# Patient Record
Sex: Male | Born: 1945 | Race: Black or African American | Hispanic: No | Marital: Single | Smoking: Never smoker
Health system: Southern US, Community
[De-identification: ages and names within clinical notes are randomized; demographics above are authoritative.]

## PROBLEM LIST (undated history)

## (undated) DIAGNOSIS — S065X9A Traumatic subdural hemorrhage with loss of consciousness of unspecified duration, initial encounter: Secondary | ICD-10-CM

## (undated) DIAGNOSIS — I10 Essential (primary) hypertension: Secondary | ICD-10-CM

## (undated) DIAGNOSIS — C73 Malignant neoplasm of thyroid gland: Secondary | ICD-10-CM

## (undated) DIAGNOSIS — M549 Dorsalgia, unspecified: Secondary | ICD-10-CM

## (undated) DIAGNOSIS — R972 Elevated prostate specific antigen [PSA]: Secondary | ICD-10-CM

## (undated) DIAGNOSIS — D649 Anemia, unspecified: Secondary | ICD-10-CM

## (undated) DIAGNOSIS — C61 Malignant neoplasm of prostate: Secondary | ICD-10-CM

## (undated) DIAGNOSIS — G40909 Epilepsy, unspecified, not intractable, without status epilepticus: Secondary | ICD-10-CM

## (undated) DIAGNOSIS — W19XXXA Unspecified fall, initial encounter: Secondary | ICD-10-CM

## (undated) HISTORY — DX: Unspecified fall, initial encounter: W19.XXXA

## (undated) HISTORY — DX: Anemia, unspecified: D64.9

## (undated) HISTORY — DX: Epilepsy, unspecified, not intractable, without status epilepticus: G40.909

## (undated) HISTORY — PX: WISDOM TOOTH EXTRACTION: SHX21

## (undated) HISTORY — DX: Malignant neoplasm of prostate: C61

## (undated) HISTORY — DX: Essential (primary) hypertension: I10

## (undated) HISTORY — DX: Dorsalgia, unspecified: M54.9

## (undated) HISTORY — PX: CATARACT EXTRACTION: SUR2

## (undated) HISTORY — PX: HERNIA REPAIR: SHX51

## (undated) HISTORY — DX: Elevated prostate specific antigen (PSA): R97.20

## (undated) HISTORY — DX: Traumatic subdural hemorrhage with loss of consciousness of unspecified duration, initial encounter: S06.5X9A

---
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
## 1997-07-15 ENCOUNTER — Ambulatory Visit (HOSPITAL_COMMUNITY): Admission: RE | Admit: 1997-07-15 | Discharge: 1997-07-15 | Payer: Self-pay | Admitting: Cardiology

## 1997-08-28 ENCOUNTER — Ambulatory Visit (HOSPITAL_COMMUNITY): Admission: RE | Admit: 1997-08-28 | Discharge: 1997-08-28 | Payer: Self-pay

## 2001-08-23 ENCOUNTER — Ambulatory Visit (HOSPITAL_BASED_OUTPATIENT_CLINIC_OR_DEPARTMENT_OTHER): Admission: RE | Admit: 2001-08-23 | Discharge: 2001-08-23 | Payer: Self-pay | Admitting: *Deleted

## 2001-08-23 ENCOUNTER — Encounter: Payer: Self-pay | Admitting: Internal Medicine

## 2005-09-23 ENCOUNTER — Ambulatory Visit: Payer: Self-pay | Admitting: Family Medicine

## 2005-10-24 ENCOUNTER — Ambulatory Visit: Payer: Self-pay | Admitting: Family Medicine

## 2006-02-10 ENCOUNTER — Ambulatory Visit: Payer: Self-pay | Admitting: Internal Medicine

## 2006-03-30 ENCOUNTER — Ambulatory Visit: Payer: Self-pay | Admitting: Family Medicine

## 2006-03-30 LAB — CONVERTED CEMR LAB
BUN: 14 mg/dL (ref 6–23)
GFR calc Af Amer: 98 mL/min
Potassium: 4.5 meq/L (ref 3.5–5.1)
Sodium: 137 meq/L (ref 135–145)

## 2006-05-02 ENCOUNTER — Ambulatory Visit: Payer: Self-pay | Admitting: Family Medicine

## 2006-05-02 LAB — CONVERTED CEMR LAB
BUN: 13 mg/dL (ref 6–23)
Calcium: 9.5 mg/dL (ref 8.4–10.5)
GFR calc Af Amer: 98 mL/min
GFR calc non Af Amer: 81 mL/min
Potassium: 4.3 meq/L (ref 3.5–5.1)

## 2006-05-31 ENCOUNTER — Ambulatory Visit: Payer: Self-pay | Admitting: Family Medicine

## 2006-05-31 LAB — CONVERTED CEMR LAB
Calcium: 9.1 mg/dL (ref 8.4–10.5)
Chloride: 105 meq/L (ref 96–112)
GFR calc Af Amer: 111 mL/min
GFR calc non Af Amer: 92 mL/min
Sodium: 140 meq/L (ref 135–145)

## 2006-08-01 DIAGNOSIS — G40909 Epilepsy, unspecified, not intractable, without status epilepticus: Secondary | ICD-10-CM

## 2006-08-08 ENCOUNTER — Encounter (INDEPENDENT_AMBULATORY_CARE_PROVIDER_SITE_OTHER): Payer: Self-pay | Admitting: Family Medicine

## 2006-08-08 ENCOUNTER — Ambulatory Visit: Payer: Self-pay | Admitting: Family Medicine

## 2006-08-08 ENCOUNTER — Encounter: Payer: Self-pay | Admitting: Family Medicine

## 2006-08-08 DIAGNOSIS — I10 Essential (primary) hypertension: Secondary | ICD-10-CM

## 2006-08-08 LAB — CONVERTED CEMR LAB
BUN: 14 mg/dL (ref 6–23)
Chloride: 108 meq/L (ref 96–112)
Creatinine, Ser: 1 mg/dL (ref 0.4–1.5)
GFR calc non Af Amer: 81 mL/min
Potassium: 4.4 meq/L (ref 3.5–5.1)
Sodium: 143 meq/L (ref 135–145)

## 2006-08-10 ENCOUNTER — Telehealth (INDEPENDENT_AMBULATORY_CARE_PROVIDER_SITE_OTHER): Payer: Self-pay | Admitting: *Deleted

## 2006-10-16 ENCOUNTER — Telehealth (INDEPENDENT_AMBULATORY_CARE_PROVIDER_SITE_OTHER): Payer: Self-pay | Admitting: *Deleted

## 2006-10-18 ENCOUNTER — Encounter: Admission: RE | Admit: 2006-10-18 | Discharge: 2006-10-18 | Payer: Self-pay | Admitting: Family Medicine

## 2006-11-02 ENCOUNTER — Encounter: Admission: RE | Admit: 2006-11-02 | Discharge: 2006-11-02 | Payer: Self-pay | Admitting: Internal Medicine

## 2006-11-09 ENCOUNTER — Ambulatory Visit: Payer: Self-pay | Admitting: Family Medicine

## 2007-02-12 ENCOUNTER — Ambulatory Visit: Payer: Self-pay | Admitting: Family Medicine

## 2007-02-13 LAB — CONVERTED CEMR LAB
CO2: 30 meq/L (ref 19–32)
Calcium: 9.3 mg/dL (ref 8.4–10.5)
Chloride: 108 meq/L (ref 96–112)
Cholesterol: 197 mg/dL (ref 0–200)
GFR calc non Af Amer: 92 mL/min
Glucose, Bld: 103 mg/dL — ABNORMAL HIGH (ref 70–99)
HDL: 58.4 mg/dL (ref >39.0)
LDL Cholesterol: 129 mg/dL — ABNORMAL HIGH (ref 0–99)
Sodium: 144 meq/L (ref 135–145)

## 2007-05-16 ENCOUNTER — Ambulatory Visit: Payer: Self-pay | Admitting: Family Medicine

## 2007-05-18 ENCOUNTER — Encounter (INDEPENDENT_AMBULATORY_CARE_PROVIDER_SITE_OTHER): Payer: Self-pay | Admitting: *Deleted

## 2007-05-18 ENCOUNTER — Telehealth (INDEPENDENT_AMBULATORY_CARE_PROVIDER_SITE_OTHER): Payer: Self-pay | Admitting: *Deleted

## 2007-05-18 ENCOUNTER — Encounter (INDEPENDENT_AMBULATORY_CARE_PROVIDER_SITE_OTHER): Payer: Self-pay | Admitting: Family Medicine

## 2007-05-18 LAB — CONVERTED CEMR LAB
BUN: 10 mg/dL (ref 6–23)
Calcium: 9.4 mg/dL (ref 8.4–10.5)
GFR calc Af Amer: 98 mL/min
GFR calc non Af Amer: 81 mL/min
LDL Cholesterol: 106 mg/dL — ABNORMAL HIGH (ref 0–99)
PSA: 1.19 ng/mL (ref 0.10–4.00)
Phenytoin Lvl: 11.6 ug/mL (ref 10.0–20.0)
Potassium: 3.9 meq/L (ref 3.5–5.1)
Total CHOL/HDL Ratio: 2.9
Triglycerides: 61 mg/dL (ref 0–149)
VLDL: 12 mg/dL (ref 0–40)

## 2007-09-19 ENCOUNTER — Ambulatory Visit: Payer: Self-pay | Admitting: Internal Medicine

## 2007-09-19 DIAGNOSIS — J309 Allergic rhinitis, unspecified: Secondary | ICD-10-CM

## 2008-01-09 ENCOUNTER — Ambulatory Visit: Payer: Self-pay | Admitting: Internal Medicine

## 2008-04-09 ENCOUNTER — Telehealth (INDEPENDENT_AMBULATORY_CARE_PROVIDER_SITE_OTHER): Payer: Self-pay | Admitting: *Deleted

## 2008-04-16 ENCOUNTER — Telehealth: Payer: Self-pay | Admitting: Internal Medicine

## 2008-04-24 ENCOUNTER — Telehealth (INDEPENDENT_AMBULATORY_CARE_PROVIDER_SITE_OTHER): Payer: Self-pay | Admitting: *Deleted

## 2008-05-19 ENCOUNTER — Ambulatory Visit: Payer: Self-pay | Admitting: Internal Medicine

## 2008-05-20 ENCOUNTER — Encounter: Payer: Self-pay | Admitting: Internal Medicine

## 2008-05-21 ENCOUNTER — Telehealth (INDEPENDENT_AMBULATORY_CARE_PROVIDER_SITE_OTHER): Payer: Self-pay | Admitting: *Deleted

## 2008-05-21 LAB — CONVERTED CEMR LAB
ALT: 28 units/L (ref 0–53)
AST: 31 units/L (ref 0–37)
Albumin: 3.8 g/dL (ref 3.5–5.2)
BUN: 11 mg/dL (ref 6–23)
Basophils Relative: 0.1 % (ref 0.0–3.0)
CO2: 29 meq/L (ref 19–32)
Calcium: 9 mg/dL (ref 8.4–10.5)
Chloride: 103 meq/L (ref 96–112)
Cholesterol: 188 mg/dL (ref 0–200)
Creatinine, Ser: 0.9 mg/dL (ref 0.4–1.5)
Eosinophils Absolute: 0.1 10*3/uL (ref 0.0–0.7)
Eosinophils Relative: 1.5 % (ref 0.0–5.0)
GFR calc non Af Amer: 91 mL/min
Hemoglobin: 14.6 g/dL (ref 13.0–17.0)
MCHC: 34 g/dL (ref 30.0–36.0)
MCV: 99.1 fL (ref 78.0–100.0)
Neutro Abs: 3.6 10*3/uL (ref 1.4–7.7)
Neutrophils Relative %: 68.4 % (ref 43.0–77.0)
PSA: 1.25 ng/mL (ref 0.10–4.00)
Phenytoin Lvl: 9.2 ug/mL — ABNORMAL LOW (ref 10.0–20.0)
RBC: 4.32 M/uL (ref 4.22–5.81)
VLDL: 9 mg/dL (ref 0–40)
WBC: 5.3 10*3/uL (ref 4.5–10.5)

## 2008-06-17 ENCOUNTER — Telehealth (INDEPENDENT_AMBULATORY_CARE_PROVIDER_SITE_OTHER): Payer: Self-pay | Admitting: *Deleted

## 2008-06-20 ENCOUNTER — Telehealth (INDEPENDENT_AMBULATORY_CARE_PROVIDER_SITE_OTHER): Payer: Self-pay | Admitting: *Deleted

## 2008-07-04 ENCOUNTER — Encounter: Payer: Self-pay | Admitting: Internal Medicine

## 2008-12-24 ENCOUNTER — Ambulatory Visit: Payer: Self-pay | Admitting: Internal Medicine

## 2008-12-29 ENCOUNTER — Ambulatory Visit: Payer: Self-pay | Admitting: Internal Medicine

## 2008-12-29 DIAGNOSIS — M25559 Pain in unspecified hip: Secondary | ICD-10-CM

## 2008-12-31 ENCOUNTER — Telehealth (INDEPENDENT_AMBULATORY_CARE_PROVIDER_SITE_OTHER): Payer: Self-pay | Admitting: *Deleted

## 2009-06-23 ENCOUNTER — Ambulatory Visit: Payer: Self-pay | Admitting: Internal Medicine

## 2009-06-25 LAB — CONVERTED CEMR LAB
BUN: 11 mg/dL (ref 6–23)
Basophils Absolute: 0 10*3/uL (ref 0.0–0.1)
CO2: 30 meq/L (ref 19–32)
Chloride: 104 meq/L (ref 96–112)
Cholesterol: 198 mg/dL (ref 0–200)
Creatinine, Ser: 0.8 mg/dL (ref 0.4–1.5)
Eosinophils Absolute: 0.1 10*3/uL (ref 0.0–0.7)
MCHC: 33.7 g/dL (ref 30.0–36.0)
MCV: 98.2 fL (ref 78.0–100.0)
Monocytes Absolute: 0.4 10*3/uL (ref 0.1–1.0)
Neutrophils Relative %: 25 % — ABNORMAL LOW (ref 43.0–77.0)
Platelets: 151 10*3/uL (ref 150.0–400.0)
RDW: 13.2 % (ref 11.5–14.6)
Total CHOL/HDL Ratio: 3
Triglycerides: 69 mg/dL (ref 0.0–149.0)

## 2009-06-30 ENCOUNTER — Encounter: Payer: Self-pay | Admitting: Internal Medicine

## 2009-07-09 ENCOUNTER — Ambulatory Visit: Payer: Self-pay | Admitting: Internal Medicine

## 2009-07-14 LAB — CONVERTED CEMR LAB
Basophils Relative: 0.4 % (ref 0.0–3.0)
Eosinophils Absolute: 0.1 10*3/uL (ref 0.0–0.7)
HCT: 41.8 % (ref 39.0–52.0)
Hemoglobin: 13.9 g/dL (ref 13.0–17.0)
Lymphocytes Relative: 25.2 % (ref 12.0–46.0)
MCHC: 33.2 g/dL (ref 30.0–36.0)
MCV: 98.4 fL (ref 78.0–100.0)
Neutro Abs: 3.6 10*3/uL (ref 1.4–7.7)
RBC: 4.25 M/uL (ref 4.22–5.81)

## 2009-12-17 ENCOUNTER — Ambulatory Visit: Payer: Self-pay | Admitting: Internal Medicine

## 2009-12-30 ENCOUNTER — Telehealth (INDEPENDENT_AMBULATORY_CARE_PROVIDER_SITE_OTHER): Payer: Self-pay | Admitting: *Deleted

## 2010-04-13 NOTE — Consult Note (Signed)
Summary: [facility]  [facility]   Imported By: Lanelle Bal 06/30/2009 09:10:39  _____________________________________________________________________  External Attachment:    Type:   Image     Comment:   External Document

## 2010-04-13 NOTE — Assessment & Plan Note (Signed)
Summary: cpx/kdc   Vital Signs:  Patient profile:   65 year old male Height:      72.75 inches Weight:      203 pounds BMI:     27.06 Pulse rate:   56 / minute BP sitting:   130 / 74  (left arm)  Vitals Entered By: Doristine Devoid (June 23, 2009 12:32 PM) CC: CPX AND LAB   History of Present Illness: CPX  Allergies: No Known Drug Allergies  Past History:  Past Medical History: Hypertension Seizure disorder, Dx remotely, last SZ activity aprox 1998 Allergic rhinitis h/o HNP aprox 2008, no surgery  Past Surgical History: Reviewed history from 09/19/2007 and no changes required. hernia repair as an  infant  Family History: Emphysema - M +smoker Brain tumor - F CAD - no DM - no HTN - F, bro stroke - no colon Ca - no prostate Ca - no leukemia - sis, died   Social History: Retired: [employer] widower, single  2 children, one in [place] another iin [place]-- does watch  Regular exercise- goes to the gym daily  Tobacco--no ETOH-- hardly ever  Review of Systems General:  Denies fever and weight loss. CV:  Denies chest pain or discomfort and swelling of feet. Resp:  Denies cough and shortness of breath. GI:  Denies bloody stools, diarrhea, nausea, and vomiting. GU:  Denies hematuria, urinary frequency, and urinary hesitancy.  Physical Exam  General:  alert, well-developed, and well-nourished.   Neck:  no masses, no thyromegaly, and normal carotid upstroke.   Lungs:  normal respiratory effort, no intercostal retractions, no accessory muscle use, and normal breath sounds.   Heart:  normal rate, regular rhythm, no murmur, and no gallop.   Abdomen:  soft, non-tender, no distention, no masses, no guarding, and no rigidity.   Rectal:  external hemorrhoids noted. Normal sphincter tone. No rectal masses or tenderness. Brown stool, Hemoccult negative Prostate:  Prostate gland firm and smooth, no enlargement, nodularity, tenderness, mass, asymmetry or  induration. Extremities:  no lower extremity edema Psych:  Cognition and judgment appear intact. Alert and cooperative with normal attention span and concentration.  not anxious appearing and not depressed appearing.     Impression & Recommendations:  Problem # 1:  PREVENTIVE HEALTH CARE (ICD-V70.0)  Td 09 colonoscopy 08/2001 Dr. Luther Parody,  normal.  Hemoccult negative today.  Next colonoscopy 2013 continue healthy lifestyle labs  Orders: Venipuncture (42706) TLB-ALT (SGPT) (84460-ALT) TLB-AST (SGOT) (84450-SGOT) TLB-BMP (Basic Metabolic Panel-BMET) (80048-METABOL) TLB-CBC Platelet - w/Differential (85025-CBCD) TLB-Lipid Panel (80061-LIPID) TLB-PSA (Prostate Specific Antigen) (84153-PSA)  Problem # 2:  SEIZURE DISORDER (ICD-780.39) just sar Dr Sandria Manly this week, dilantin level checked  His updated medication list for this problem includes:    Dilantin 100 Mg Caps (Phenytoin sodium extended) .Marland Kitchen... Take 2 tablets twice a day  Problem # 3:  HYPERTENSION (ICD-401.9) well controlled, see instructions ambulatory BPs great His updated medication list for this problem includes:    Azor 10-40 Mg Tabs (Amlodipine-olmesartan) .Marland Kitchen... Take one a day    Furosemide 40 Mg Tabs (Furosemide) .Marland Kitchen... 1/2 by mouth bid  BP today: 130/74 Prior BP: 140/72 (12/29/2008)  Labs Reviewed: K+: 3.8 (05/19/2008) Creat: : 0.9 (05/19/2008)   Chol: 188 (05/19/2008)   HDL: 69.6 (05/19/2008)   LDL: 109 (05/19/2008)   TG: 46 (05/19/2008)  Complete Medication List: 1)  Adult Aspirin Ec Low Strength 81 Mg Tbec (Aspirin) .... Take one by mouth daily 2)  Azor 10-40 Mg Tabs (  Amlodipine-olmesartan) .... Take one a day 3)  Furosemide 40 Mg Tabs (Furosemide) .... 1/2 by mouth bid 4)  Dilantin 100 Mg Caps (Phenytoin sodium extended) .... Take 2 tablets twice a day 5)  Multivitamin  .... Take one daily 6)  Resveratrol 160mg   .... 2 by mouth once daily 7)  Fish Oil 1200mg   .... 3 by mouth once daily 8)  Calcium - Vit D  600mg   .... 2 by mouth once daily 9)  Flonase 50 Mcg/act Susp (Fluticasone propionate) .... 2 sprays each nostril daily  Patient Instructions: 1)  Check your blood pressure 2 or 3 times a month. If it is more than 140/85 consistently,please let us know 2)  Please schedule a follow-up appointment in 1 year.

## 2010-04-13 NOTE — Progress Notes (Signed)
Summary: appt/ [facility] 10/25  Phone Note Outgoing Call   Call placed by: Army Fossa CMA,  December 30, 2009 2:45 PM Summary of Call: Pt due for OV with Dr.Paz.   Follow-up for Phone Call        [facility] FOR OFFICE VISIT WITH DR PAZ.Marland KitchenMarland KitchenJerolyn Shin  January 05, 2010 10:55 AM   patient returned call - he was confused why he needed an appt - he said at his cpx dr Drue Novel told him return 1 year - appt scheduled [id number] .Marland KitchenOkey Regal [place]  January 05, 2010 12:06 PM

## 2010-04-13 NOTE — Assessment & Plan Note (Signed)
Summary: FLU SHOT///SPH  Nurse Visit Flu Vaccine Consent Questions     Do you have a history of severe allergic reactions to this vaccine? no    Any prior history of allergic reactions to egg and/or gelatin? no    Do you have a sensitivity to the preservative Thimersol? no    Do you have a past history of Guillan-Barre Syndrome? no    Do you currently have an acute febrile illness? no    Have you ever had a severe reaction to latex? no    Vaccine information given and explained to patient? yes    Are you currently pregnant? no    Lot Number:[id number]   Exp Date:09/11/2010   Site Given  Left Deltoid IM   Allergies: No Known Drug Allergies  Orders Added: 1)  Admin 1st Vaccine [90471] 2)  Flu Vaccine 67yrs + [32355]

## 2010-05-19 ENCOUNTER — Encounter: Payer: Self-pay | Admitting: Internal Medicine

## 2010-06-30 ENCOUNTER — Ambulatory Visit (INDEPENDENT_AMBULATORY_CARE_PROVIDER_SITE_OTHER): Payer: BC Managed Care – PPO | Admitting: Internal Medicine

## 2010-06-30 ENCOUNTER — Encounter: Payer: Self-pay | Admitting: Internal Medicine

## 2010-06-30 DIAGNOSIS — R569 Unspecified convulsions: Secondary | ICD-10-CM

## 2010-06-30 DIAGNOSIS — I10 Essential (primary) hypertension: Secondary | ICD-10-CM

## 2010-06-30 DIAGNOSIS — Z0000 Encounter for general adult medical examination without abnormal findings: Secondary | ICD-10-CM

## 2010-06-30 LAB — LIPID PANEL
Cholesterol: 208 mg/dL — ABNORMAL HIGH (ref 0–200)
HDL: 72.4 mg/dL (ref >39.00)
Total CHOL/HDL Ratio: 3
Triglycerides: 66 mg/dL (ref 0.0–149.0)
VLDL: 13.2 mg/dL (ref 0.0–40.0)

## 2010-06-30 LAB — CBC WITH DIFFERENTIAL/PLATELET
Basophils Absolute: 0 K/uL (ref 0.0–0.1)
Basophils Relative: 0.6 % (ref 0.0–3.0)
Eosinophils Absolute: 0.1 K/uL (ref 0.0–0.7)
Eosinophils Relative: 2.3 % (ref 0.0–5.0)
HCT: 41.7 % (ref 39.0–52.0)
Hemoglobin: 14.3 g/dL (ref 13.0–17.0)
Lymphocytes Relative: 21.2 % (ref 12.0–46.0)
Lymphs Abs: 1.2 K/uL (ref 0.7–4.0)
MCHC: 34.2 g/dL (ref 30.0–36.0)
MCV: 98.4 fl (ref 78.0–100.0)
Monocytes Absolute: 0.6 K/uL (ref 0.1–1.0)
Monocytes Relative: 11.5 % (ref 3.0–12.0)
Neutro Abs: 3.6 K/uL (ref 1.4–7.7)
Neutrophils Relative %: 64.4 % (ref 43.0–77.0)
Platelets: 158 K/uL (ref 150.0–400.0)
RBC: 4.24 Mil/uL (ref 4.22–5.81)
RDW: 13 % (ref 11.5–14.6)
WBC: 5.5 K/uL (ref 4.5–10.5)

## 2010-06-30 LAB — TSH: TSH: 1.07 u[IU]/mL (ref 0.35–5.50)

## 2010-06-30 LAB — PSA: PSA: 1.04 ng/mL (ref 0.10–4.00)

## 2010-06-30 LAB — BASIC METABOLIC PANEL
Calcium: 9 mg/dL (ref 8.4–10.5)
GFR: 101.64 mL/min (ref >60.00)
Sodium: 139 mEq/L (ref 135–145)

## 2010-06-30 LAB — ALT: ALT: 28 U/L (ref 0–53)

## 2010-06-30 LAB — AST: AST: 31 U/L (ref 0–37)

## 2010-06-30 LAB — LDL CHOLESTEROL, DIRECT: Direct LDL: 118 mg/dL

## 2010-06-30 NOTE — Assessment & Plan Note (Addendum)
Td 09 Continue with healthy lifestyle Now he has a family history of prostate cancer, he is asymptomatic, rectal exam negative, check a PSA. Last colonoscopy 2003, was recommended to have another colonoscopy in 5 to 10 years. Patient is asymptomatic, no family history. Plan to have a colonoscopy next year.

## 2010-06-30 NOTE — Assessment & Plan Note (Signed)
Just saw neurology, needs a Dilantin level

## 2010-06-30 NOTE — Progress Notes (Signed)
  Subjective:    Patient ID: Joel [name], male    DOB: October 20, 1945, 65 y.o.   MRN: [id number]  HPI  Complete physical exam His older brother was diagnosed with prostate cancer recently, last complete physical exam was 06-2009, at that time the prostate exam was negative, PSA was normal 2 times last week so some floaters in his visual field. No associated slurred speech, focal deficit or blurred vision.  Past Medical History  Diagnosis Date  . Seizure disorder     Dx remotely, last SZ activity aprox 1998  . Back pain     HNP dx ~2008, no surgery  . HTN (hypertension)     Past Surgical History  Procedure Date  . Hernia repair     as an infant    Family History  Problem Relation Age of Onset  . Emphysema Mother     smoker  . Hypertension Father     F and B   . Leukemia Sister   . Diabetes Neg Hx   . Prostate cancer Brother 77  . Colon cancer Neg Hx   . Coronary artery disease Neg Hx     History   Social History  . Marital Status: Single    Spouse Name: N/A    Number of Children: 2  . Years of Education: N/A   Occupational History  . Retired: [employer]    Social History Main Topics  . Smoking status: Never Smoker   . Smokeless tobacco: Never Used  . Alcohol Use: Yes     occasionally  . Drug Use: Yes  . Sexually Active: Not on file   Other Topics Concern  . Not on file   Social History Narrative   2 children, one in [place] another in [place]....Marland KitchenMarland KitchenDiet- does watch....Regular Exercise-- goes to gym daily     Review of Systems  Constitutional: Negative for fever, fatigue and unexpected weight change.  Respiratory: Negative for cough and shortness of breath.   Cardiovascular: Negative for chest pain, palpitations and leg swelling.  Gastrointestinal: Negative for nausea, vomiting, abdominal pain, diarrhea and blood in stool.  Genitourinary: Negative for dysuria, urgency and hematuria.       Objective:   Physical Exam  Constitutional: He is  oriented to person, place, and time. He appears well-developed and well-nourished. No distress.  Neck: Normal range of motion. Neck supple. No thyromegaly present.       Carotid pulses normal bilaterally  Cardiovascular: Normal rate, regular rhythm and normal heart sounds.   No murmur heard. Pulmonary/Chest: Effort normal and breath sounds normal. No respiratory distress. He has no wheezes. He has no rales.  Abdominal: Soft. Bowel sounds are normal. He exhibits no distension. There is no tenderness. There is no rebound and no guarding.  Genitourinary: Prostate normal. Guaiac stool: no stools found.  Musculoskeletal: He exhibits no edema.  Neurological: He is alert and oriented to person, place, and time.  Psychiatric: He has a normal mood and affect. His behavior is normal. Judgment and thought content normal.          Assessment & Plan:

## 2010-06-30 NOTE — Patient Instructions (Addendum)
Return to the office in one year as long as your blood pressure is less than 140/85. Please check the blood pressure 2 or 3 times a month. Please see his your eye doctor for that "floaters" ; if they become more intense or you have any other symptoms, let me know

## 2010-06-30 NOTE — Assessment & Plan Note (Signed)
No change 

## 2010-07-22 ENCOUNTER — Encounter: Payer: Self-pay | Admitting: Family Medicine

## 2010-07-22 ENCOUNTER — Ambulatory Visit (INDEPENDENT_AMBULATORY_CARE_PROVIDER_SITE_OTHER): Payer: BC Managed Care – PPO | Admitting: Family Medicine

## 2010-07-22 VITALS — BP 118/74 | HR 67 | Temp 98.8°F | Resp 14 | Wt 204.2 lb

## 2010-07-22 DIAGNOSIS — H60399 Other infective otitis externa, unspecified ear: Secondary | ICD-10-CM

## 2010-07-22 DIAGNOSIS — H6500 Acute serous otitis media, unspecified ear: Secondary | ICD-10-CM | POA: Insufficient documentation

## 2010-07-22 DIAGNOSIS — H6090 Unspecified otitis externa, unspecified ear: Secondary | ICD-10-CM

## 2010-07-22 MED ORDER — PREDNISONE 10 MG PO TABS: 10.0000 mg | ORAL_TABLET | Freq: Every day | ORAL | Status: AC

## 2010-07-22 MED ORDER — MECLIZINE HCL 25 MG PO TABS: 25.0000 mg | ORAL_TABLET | Freq: Three times a day (TID) | ORAL | Status: DC | PRN

## 2010-07-22 MED ORDER — CEFUROXIME AXETIL 500 MG PO TABS: 500.0000 mg | ORAL_TABLET | Freq: Two times a day (BID) | ORAL | Status: AC

## 2010-07-22 NOTE — Patient Instructions (Signed)
Finish the antibiotics and prednisone  Take an antihistamine---ex claritin, allegra or zyrtec Call if symptoms do not improve or worsen and we can refer to ENT

## 2010-07-22 NOTE — Assessment & Plan Note (Signed)
Prednisone taper abx Antihistamine ent if no improvement

## 2010-07-22 NOTE — Progress Notes (Signed)
  Subjective:    Patient ID: Joel [name], male    DOB: 1945-07-29, 65 y.o.   MRN: [id number]  HPI Pt here c/o R ear fullness and dizziness since weekend.  No fever, no cp or palp, or sob.     Review of Systems As above    Objective:   Physical Exam  Constitutional: He appears well-developed and well-nourished.  HENT:  Right Ear: Ear canal normal. There is tenderness. No drainage or swelling. Tympanic membrane mobility is abnormal. A middle ear effusion is present. Decreased hearing is noted.  Left Ear: Hearing, tympanic membrane, external ear and ear canal normal.  Neck: Normal range of motion. Neck supple.  Cardiovascular: Normal heart sounds.   No murmur heard. Pulmonary/Chest: Effort normal and breath sounds normal.  Psychiatric: He has a normal mood and affect.          Assessment & Plan:

## 2010-08-02 ENCOUNTER — Telehealth: Payer: Self-pay | Admitting: Internal Medicine

## 2010-08-02 DIAGNOSIS — R42 Dizziness and giddiness: Secondary | ICD-10-CM

## 2010-08-02 NOTE — Telephone Encounter (Signed)
Ok to refer.

## 2010-08-02 NOTE — Telephone Encounter (Signed)
Please advise 

## 2010-08-02 NOTE — Telephone Encounter (Signed)
Patient was seen [phone] - he finished antibiotic but still cant hear - he doesn't have pain but does have vertigo --- patient wants referral to ent

## 2010-08-31 ENCOUNTER — Other Ambulatory Visit: Payer: Self-pay | Admitting: Otolaryngology

## 2010-08-31 DIAGNOSIS — H9191 Unspecified hearing loss, right ear: Secondary | ICD-10-CM

## 2010-09-07 ENCOUNTER — Ambulatory Visit
Admission: RE | Admit: 2010-09-07 | Discharge: 2010-09-07 | Disposition: A | Discharge status: Home / Self Care | Payer: BC Managed Care – PPO | Source: Ambulatory Visit | Attending: Otolaryngology | Admitting: Otolaryngology

## 2010-09-07 DIAGNOSIS — H9191 Unspecified hearing loss, right ear: Secondary | ICD-10-CM

## 2010-09-07 MED ORDER — GADOBENATE DIMEGLUMINE 529 MG/ML IV SOLN
19.0000 mL | Freq: Once | INTRAVENOUS | Status: AC | PRN
  Administered 2010-09-07: 19 mL via INTRAVENOUS

## 2010-09-14 ENCOUNTER — Other Ambulatory Visit: Payer: Self-pay | Admitting: Internal Medicine

## 2011-02-25 ENCOUNTER — Encounter: Payer: Self-pay | Admitting: Family Medicine

## 2011-02-25 ENCOUNTER — Ambulatory Visit (INDEPENDENT_AMBULATORY_CARE_PROVIDER_SITE_OTHER)
Admission: RE | Admit: 2011-02-25 | Discharge: 2011-02-25 | Disposition: A | Discharge status: Home / Self Care | Payer: BC Managed Care – PPO | Source: Ambulatory Visit | Attending: Family Medicine | Admitting: Family Medicine

## 2011-02-25 ENCOUNTER — Emergency Department (INDEPENDENT_AMBULATORY_CARE_PROVIDER_SITE_OTHER): Payer: BC Managed Care – PPO

## 2011-02-25 ENCOUNTER — Encounter (HOSPITAL_BASED_OUTPATIENT_CLINIC_OR_DEPARTMENT_OTHER): Payer: Self-pay | Admitting: Emergency Medicine

## 2011-02-25 ENCOUNTER — Other Ambulatory Visit: Payer: Self-pay | Admitting: Family Medicine

## 2011-02-25 ENCOUNTER — Inpatient Hospital Stay (HOSPITAL_BASED_OUTPATIENT_CLINIC_OR_DEPARTMENT_OTHER)
Admission: EM | Admit: 2011-02-25 | Discharge: 2011-03-02 | DRG: 810 | Disposition: A | Discharge status: Home / Self Care | Payer: BC Managed Care – PPO | Attending: Internal Medicine | Admitting: Internal Medicine

## 2011-02-25 ENCOUNTER — Other Ambulatory Visit: Payer: Self-pay

## 2011-02-25 ENCOUNTER — Ambulatory Visit (INDEPENDENT_AMBULATORY_CARE_PROVIDER_SITE_OTHER): Payer: BC Managed Care – PPO | Admitting: Family Medicine

## 2011-02-25 DIAGNOSIS — I609 Nontraumatic subarachnoid hemorrhage, unspecified: Principal | ICD-10-CM | POA: Diagnosis present

## 2011-02-25 DIAGNOSIS — W19XXXA Unspecified fall, initial encounter: Secondary | ICD-10-CM

## 2011-02-25 DIAGNOSIS — D181 Lymphangioma, any site: Secondary | ICD-10-CM | POA: Diagnosis present

## 2011-02-25 DIAGNOSIS — R55 Syncope and collapse: Secondary | ICD-10-CM

## 2011-02-25 DIAGNOSIS — E876 Hypokalemia: Secondary | ICD-10-CM | POA: Diagnosis present

## 2011-02-25 DIAGNOSIS — H905 Unspecified sensorineural hearing loss: Secondary | ICD-10-CM | POA: Diagnosis present

## 2011-02-25 DIAGNOSIS — H6690 Otitis media, unspecified, unspecified ear: Secondary | ICD-10-CM | POA: Insufficient documentation

## 2011-02-25 DIAGNOSIS — G40909 Epilepsy, unspecified, not intractable, without status epilepticus: Secondary | ICD-10-CM | POA: Diagnosis present

## 2011-02-25 DIAGNOSIS — W1809XA Striking against other object with subsequent fall, initial encounter: Secondary | ICD-10-CM | POA: Diagnosis present

## 2011-02-25 DIAGNOSIS — J09X2 Influenza due to identified novel influenza A virus with other respiratory manifestations: Secondary | ICD-10-CM | POA: Diagnosis present

## 2011-02-25 DIAGNOSIS — R51 Headache: Secondary | ICD-10-CM

## 2011-02-25 DIAGNOSIS — G319 Degenerative disease of nervous system, unspecified: Secondary | ICD-10-CM

## 2011-02-25 DIAGNOSIS — I10 Essential (primary) hypertension: Secondary | ICD-10-CM | POA: Diagnosis present

## 2011-02-25 DIAGNOSIS — J101 Influenza due to other identified influenza virus with other respiratory manifestations: Secondary | ICD-10-CM | POA: Diagnosis present

## 2011-02-25 DIAGNOSIS — R509 Fever, unspecified: Secondary | ICD-10-CM | POA: Diagnosis present

## 2011-02-25 DIAGNOSIS — S0990XA Unspecified injury of head, initial encounter: Secondary | ICD-10-CM

## 2011-02-25 LAB — COMPREHENSIVE METABOLIC PANEL
Albumin: 3.5 g/dL (ref 3.5–5.2)
BUN: 9 mg/dL (ref 6–23)
Calcium: 8.5 mg/dL (ref 8.4–10.5)
Creatinine, Ser: 0.9 mg/dL (ref 0.50–1.35)
GFR calc Af Amer: >90 mL/min (ref >90)
Glucose, Bld: 105 mg/dL — ABNORMAL HIGH (ref 70–99)
Total Protein: 6.6 g/dL (ref 6.0–8.3)

## 2011-02-25 LAB — PHENYTOIN LEVEL, TOTAL: Phenytoin Lvl: 6.6 ug/mL — ABNORMAL LOW (ref 10.0–20.0)

## 2011-02-25 LAB — DIFFERENTIAL
Basophils Absolute: 0 10*3/uL (ref 0.0–0.1)
Basophils Relative: 0 % (ref 0–1)
Eosinophils Absolute: 0 10*3/uL (ref 0.0–0.7)
Lymphs Abs: 1 10*3/uL (ref 0.7–4.0)
Neutrophils Relative %: 69 % (ref 43–77)

## 2011-02-25 LAB — CBC
MCH: 32.6 pg (ref 26.0–34.0)
MCHC: 34.2 g/dL (ref 30.0–36.0)
Platelets: 121 10*3/uL — ABNORMAL LOW (ref 150–400)
RBC: 3.83 MIL/uL — ABNORMAL LOW (ref 4.22–5.81)
RDW: 11.3 % — ABNORMAL LOW (ref 11.5–15.5)

## 2011-02-25 LAB — CK TOTAL AND CKMB (NOT AT ARMC)
CK, MB: 1.5 ng/mL (ref 0.3–4.0)
Total CK: 140 U/L (ref 7–232)

## 2011-02-25 LAB — APTT: aPTT: 31 seconds (ref 24–37)

## 2011-02-25 LAB — TROPONIN I: Troponin I: <0.3 ng/mL (ref <0.30)

## 2011-02-25 LAB — PROTIME-INR: Prothrombin Time: 14 seconds (ref 11.6–15.2)

## 2011-02-25 MED ORDER — SODIUM CHLORIDE 0.9 % IV SOLN
500.0000 mg | Freq: Once | INTRAVENOUS | Status: DC
  Filled 2011-02-25: qty 10

## 2011-02-25 MED ORDER — ONDANSETRON HCL 4 MG/2ML IJ SOLN
4.0000 mg | Freq: Once | INTRAMUSCULAR | Status: AC
  Administered 2011-02-25: 4 mg via INTRAVENOUS
  Filled 2011-02-25: qty 2

## 2011-02-25 MED ORDER — AMOXICILLIN 500 MG PO CAPS: 500.0000 mg | ORAL_CAPSULE | Freq: Two times a day (BID) | ORAL | Status: DC

## 2011-02-25 MED ORDER — PHENYTOIN SODIUM 50 MG/ML IJ SOLN
500.0000 mg | Freq: Once | INTRAMUSCULAR | Status: AC
  Administered 2011-02-25: 500 mg via INTRAVENOUS
  Filled 2011-02-25: qty 10

## 2011-02-25 MED ORDER — HYDROMORPHONE HCL PF 1 MG/ML IJ SOLN
1.0000 mg | Freq: Once | INTRAMUSCULAR | Status: AC
  Administered 2011-02-25: 1 mg via INTRAVENOUS
  Filled 2011-02-25: qty 1

## 2011-02-25 NOTE — Assessment & Plan Note (Addendum)
New.  Most likely due to the pressure w/in the middle ear combined w/ low blood sugar.  Given bruise and TTP over back of head will get CT to ensure no intracranial bleeding or other cause for pt's syncopal episode.  Neuro exam WNL.  HR regular- no hx of arrhythmia.

## 2011-02-25 NOTE — Assessment & Plan Note (Signed)
New L OM.  Start Amox.  Reviewed supportive care and red flags that should prompt return.  Pt expressed understanding and is in agreement w/ plan.

## 2011-02-25 NOTE — Progress Notes (Signed)
  Subjective:    Patient ID: Joel [name], male    DOB: 1945/10/13, 65 y.o.   MRN: [id number]  HPI Sinusitis- sxs started 2 days ago w/ dizziness.  Vomited x1.  Didn't eat anything for rest of day.  Woke at 2 am to urinate and 'next thing i knew i was on the floor'.  Hit back of head after passing out- had some bleeding.  Then developed facial pain.  + low grade temps.  + congestion.  No ear pain.  Minimal cough.  Denies pain at site of impact on back of head, HAs, visual changes, seizure activity.   Review of Systems For ROS see HPI     Objective:   Physical Exam  Vitals reviewed. Constitutional: He is oriented to person, place, and time. He appears well-developed and well-nourished.  HENT:  Head: Normocephalic.  Nose: Nose normal.  Mouth/Throat: Oropharynx is clear and moist. No oropharyngeal exudate.       Small bruise on R occiput, + TTP.  No hematoma or laceration No TTP over sinuses R TM WNL L TM dull, erythematous, visible fluid, poor landmarks.  Eyes: Conjunctivae and EOM are normal. Pupils are equal, round, and reactive to light.  Neck: Normal range of motion. Neck supple.  Cardiovascular: Normal rate, regular rhythm and normal heart sounds.   Pulmonary/Chest: Effort normal and breath sounds normal. No respiratory distress. He has no wheezes. He has no rales.  Neurological: He is alert and oriented to person, place, and time. He has normal reflexes. No cranial nerve deficit. Coordination normal.  Skin: Skin is warm and dry.          Assessment & Plan:

## 2011-02-25 NOTE — ED Provider Notes (Signed)
History     CSN: [id number] Arrival date & time: 02/25/2011  4:31 PM   First MD Initiated Contact with Patient 02/25/11 1620      Chief Complaint  Patient presents with  . Head Injury    (Consider location/radiation/quality/duration/timing/severity/associated sxs/prior treatment) HPI  Patient with syncopal episode Wednesday night.  Diffuse weakness since episode.  Patient went to MD today due to not feeling well.  Diagnosed with ear infection and sent for head ct outpatient.  Patient had head ct done as outpatient then called back by radiologist- patient driving and came here.  Patient states he had onset of dizziness Wednesday evening followed by nausea and vomiting.  No headache.  Later went in bathroom and had syncopal episode while urinating.  Patient awoke on floor and states he had struck back of head and later saw blood on pillow.  History of seizure disorder on dilantin.  States he has not had a seizure for many years and does not think he had one Wednesday night.  He lives alone.  He stayed in bed all day yesterday due to generalized weakness.  Patient with left anterior headache since fall.  Patient denies focal weakness, difficulty word finding, or walking.    Past Medical History  Diagnosis Date  . Seizure disorder     Dx remotely, last SZ activity aprox 1998  . Back pain     HNP dx ~2008, no surgery  . HTN (hypertension)     Past Surgical History  Procedure Date  . Hernia repair     as an infant    Family History  Problem Relation Age of Onset  . Emphysema Mother     smoker  . Hypertension Father     F and B   . Leukemia Sister   . Diabetes Neg Hx   . Prostate cancer Brother 13  . Colon cancer Neg Hx   . Coronary artery disease Neg Hx     History  Substance Use Topics  . Smoking status: Never Smoker   . Smokeless tobacco: Never Used  . Alcohol Use: Yes     occasionally      Review of Systems  All other systems reviewed and are  negative.    Allergies  Review of patient's allergies indicates no known allergies.  Home Medications   Current Outpatient Rx  Name Route Sig Dispense Refill  . AMOXICILLIN 500 MG PO CAPS Oral Take 1 capsule (500 mg total) by mouth 2 (two) times daily. 20 capsule 0  . ASPIRIN 81 MG PO TABS Oral Take 81 mg by mouth daily.      . AZOR 10-40 MG PO TABS  take 1 tablet by mouth once daily 30 tablet 6  . CALCIUM-VITAMIN D PO Oral Take by mouth. 2 by mouth once daily     . FUROSEMIDE 40 MG PO TABS  1/2 by mouth two times a day.     . MULTIVITAMINS PO TABS Oral Take 1 tablet by mouth daily.      Marland Kitchen FISH OIL 1200 MG PO CAPS Oral Take by mouth. 3 by mouth once daily     . PHENYTOIN SODIUM EXTENDED 100 MG PO CAPS  Take 2 tablets twice a day.       BP 164/78  Pulse 77  Temp(Src) 100.5 F (38.1 C) (Oral)  Resp 14  Ht 6\' 1"  (1.854 m)  Wt 207 lb (93.895 kg)  BMI 27.31 kg/m2  SpO2 99%  Physical Exam  Nursing note and vitals reviewed. Constitutional: He is oriented to person, place, and time. He appears well-developed and well-nourished.  HENT:  Head: Normocephalic and atraumatic.  Eyes: Conjunctivae are normal. Pupils are equal, round, and reactive to light.  Neck: Normal range of motion. Neck supple.  Cardiovascular: Normal rate, regular rhythm, normal heart sounds and intact distal pulses.   Pulmonary/Chest: Effort normal and breath sounds normal.  Abdominal: Soft.  Musculoskeletal: Normal range of motion.  Neurological: He is alert and oriented to person, place, and time. He has normal strength and normal reflexes. No sensory deficit. He displays a negative Romberg sign. GCS eye subscore is 4. GCS verbal subscore is 5. GCS motor subscore is 6.  Skin: Skin is warm and dry.  Psychiatric: He has a normal mood and affect. His behavior is normal. Judgment and thought content normal.    ED Course  Procedures (including critical care time)   Labs Reviewed  CBC  DIFFERENTIAL  CK TOTAL  AND CKMB  COMPREHENSIVE METABOLIC PANEL  TROPONIN I  PROTIME-INR  APTT   Ct Head Wo Contrast  02/25/2011  *RADIOLOGY REPORT*  Clinical Data: Fall last night  CT HEAD WITHOUT CONTRAST  Technique:  Contiguous axial images were obtained from the base of the skull through the vertex without contrast.  Comparison: 09/07/2010  Findings: There is diffuse patchy low density throughout the subcortical and periventricular white matter consistent with chronic small vessel ischemic change.  There is prominence of the sulci and ventricles consistent with brain atrophy.  On images number 14-12 there is abnormal, asymmetric hyperdensity overlying the sulci of the left frontal lobe consistent with subarachnoid hemorrhage.  Focal area of parenchymal hyperintensity with surrounding edema is noted measuring 4.3 mm.  This is consistent with a small parenchymal hematoma.  No significant mass effect or midline shift.  The paranasal sinuses are clear.  The mastoid air cells are clear. The skull appears intact.  IMPRESSION:  1.  Left frontal lobe of hyper densities are identified and suspicious for a parenchymal contusion and hemorrhage as well as a small amount of subarachnoid hemorrhage. 2.  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  Critical Value/emergent results were called by telephone at the time of interpretation on 02/25/11  at 4:00 p.m.  to  Beverely Low, MD, who verbally acknowledged these results.  Original Report Authenticated By: Rosealee Albee, M.D.     No diagnosis found.    MDM   Date: 02/25/2011  Rate: 74  Rhythm: normal sinus rhythm  QRS Axis: normal  Intervals: normal  ST/T Wave abnormalities: normal  Conduction Disutrbances:none  Narrative Interpretation:   Old EKG Reviewed: none available   Patient's care discussed with Dr. Venetia Maxon and he reviewed head ct.   Discussed with Dr. Lovell Sheehan.  Plan transfer to 3000 neurotele bed.      Hilario Quarry, MD 02/25/11 [phone]

## 2011-02-25 NOTE — Patient Instructions (Signed)
We'll notify you of your CT results Take the Amoxicillin twice daily w/ food for the ear infection Alternate tylenol and ibuprofen as needed for pain or fever REST! Call with any questions or concerns If your symptoms change or worsen- please go to the ER Hang in there! Happy Holidays!

## 2011-02-25 NOTE — ED Notes (Signed)
cerebyx not available in pyxis-discussed with [facility] and EDP Ray-orders changed to dilantin

## 2011-02-25 NOTE — ED Notes (Signed)
Unable to get urine sample °

## 2011-02-26 ENCOUNTER — Inpatient Hospital Stay (HOSPITAL_COMMUNITY): Payer: BC Managed Care – PPO

## 2011-02-26 ENCOUNTER — Encounter (HOSPITAL_COMMUNITY): Payer: Self-pay | Admitting: Internal Medicine

## 2011-02-26 DIAGNOSIS — R509 Fever, unspecified: Secondary | ICD-10-CM | POA: Diagnosis present

## 2011-02-26 DIAGNOSIS — I609 Nontraumatic subarachnoid hemorrhage, unspecified: Principal | ICD-10-CM | POA: Diagnosis present

## 2011-02-26 LAB — CBC
Hemoglobin: 12.2 g/dL — ABNORMAL LOW (ref 13.0–17.0)
MCHC: 34.1 g/dL (ref 30.0–36.0)
RDW: 12 % (ref 11.5–15.5)
WBC: 6.4 10*3/uL (ref 4.0–10.5)

## 2011-02-26 LAB — BASIC METABOLIC PANEL
Chloride: 104 mEq/L (ref 96–112)
GFR calc Af Amer: >90 mL/min (ref >90)
GFR calc non Af Amer: >90 mL/min (ref >90)
Glucose, Bld: 94 mg/dL (ref 70–99)
Potassium: 3.3 mEq/L — ABNORMAL LOW (ref 3.5–5.1)
Sodium: 135 mEq/L (ref 135–145)

## 2011-02-26 LAB — INFLUENZA PANEL BY PCR (TYPE A & B)
H1N1 flu by pcr: NOT DETECTED
Influenza B By PCR: NEGATIVE

## 2011-02-26 MED ORDER — NIMODIPINE 30 MG PO CAPS
30.0000 mg | ORAL_CAPSULE | ORAL | Status: DC
  Administered 2011-02-26 – 2011-03-02 (×26): 30 mg via ORAL
  Filled 2011-02-26 (×30): qty 1

## 2011-02-26 MED ORDER — ONDANSETRON HCL 4 MG PO TABS: 4.0000 mg | ORAL_TABLET | Freq: Four times a day (QID) | ORAL | Status: DC | PRN

## 2011-02-26 MED ORDER — OSELTAMIVIR PHOSPHATE 75 MG PO CAPS
75.0000 mg | ORAL_CAPSULE | Freq: Two times a day (BID) | ORAL | Status: DC
  Administered 2011-02-26 – 2011-03-02 (×9): 75 mg via ORAL
  Filled 2011-02-26 (×10): qty 1

## 2011-02-26 MED ORDER — POTASSIUM CHLORIDE CRYS ER 10 MEQ PO TBCR
10.0000 meq | EXTENDED_RELEASE_TABLET | Freq: Two times a day (BID) | ORAL | Status: DC
  Administered 2011-02-26 – 2011-02-28 (×5): 10 meq via ORAL
  Filled 2011-02-26 (×6): qty 1

## 2011-02-26 MED ORDER — ACETAMINOPHEN 650 MG RE SUPP: 650.0000 mg | Freq: Four times a day (QID) | RECTAL | Status: DC | PRN

## 2011-02-26 MED ORDER — SODIUM CHLORIDE 0.9 % IV SOLN
INTRAVENOUS | Status: DC
  Administered 2011-02-26 – 2011-02-28 (×6): via INTRAVENOUS
  Administered 2011-03-01: 20 mL/h via INTRAVENOUS
  Administered 2011-03-01: 03:00:00 via INTRAVENOUS

## 2011-02-26 MED ORDER — IPRATROPIUM BROMIDE 0.02 % IN SOLN
0.5000 mg | RESPIRATORY_TRACT | Status: DC
  Administered 2011-02-26 – 2011-02-27 (×6): 0.5 mg via RESPIRATORY_TRACT
  Filled 2011-02-26 (×6): qty 2.5

## 2011-02-26 MED ORDER — ONDANSETRON HCL 4 MG/2ML IJ SOLN: 4.0000 mg | Freq: Four times a day (QID) | INTRAMUSCULAR | Status: DC | PRN

## 2011-02-26 MED ORDER — ALBUTEROL SULFATE (5 MG/ML) 0.5% IN NEBU
2.5000 mg | INHALATION_SOLUTION | RESPIRATORY_TRACT | Status: DC
  Administered 2011-02-26 – 2011-02-27 (×6): 2.5 mg via RESPIRATORY_TRACT
  Filled 2011-02-26 (×6): qty 0.5

## 2011-02-26 MED ORDER — PHENYTOIN SODIUM EXTENDED 100 MG PO CAPS
300.0000 mg | ORAL_CAPSULE | ORAL | Status: DC
  Administered 2011-02-26 – 2011-03-02 (×5): 300 mg via ORAL
  Filled 2011-02-26 (×6): qty 3

## 2011-02-26 MED ORDER — PHENYTOIN SODIUM EXTENDED 100 MG PO CAPS
200.0000 mg | ORAL_CAPSULE | Freq: Every evening | ORAL | Status: DC
  Administered 2011-02-26: 200 mg via ORAL
  Filled 2011-02-26 (×2): qty 2

## 2011-02-26 MED ORDER — OXYCODONE HCL 5 MG PO TABS
5.0000 mg | ORAL_TABLET | ORAL | Status: DC | PRN
  Administered 2011-02-26 – 2011-02-28 (×12): 5 mg via ORAL
  Filled 2011-02-26 (×13): qty 1

## 2011-02-26 MED ORDER — MORPHINE SULFATE 2 MG/ML IJ SOLN
2.0000 mg | INTRAMUSCULAR | Status: DC | PRN
  Administered 2011-02-26 – 2011-02-28 (×4): 2 mg via INTRAVENOUS
  Filled 2011-02-26 (×4): qty 1

## 2011-02-26 MED ORDER — ACETAMINOPHEN 325 MG PO TABS
650.0000 mg | ORAL_TABLET | Freq: Four times a day (QID) | ORAL | Status: DC | PRN
  Administered 2011-02-26 – 2011-02-27 (×2): 650 mg via ORAL
  Filled 2011-02-26 (×2): qty 2

## 2011-02-26 NOTE — Progress Notes (Signed)
Patient ID: Joel [name], male   DOB: 01-22-1946, 65 y.o.   MRN: [id number] Subjective: Patient seen.Complain about cough and congestion in the upper airway.Cough is productive of whitish sputum.Denies any associated chest pain or sob but mild frontal headache.  Objective: Weight change:  No intake or output data in the 24 hours ending 02/26/11 0920 BP 133/74  Pulse 67  Temp(Src) 98.7 F (37.1 C) (Oral)  Resp 20  Ht 6\' 1"  (1.854 m)  Wt 93.895 kg (207 lb)  BMI 27.31 kg/m2  SpO2 97% Physical Exam: General appearance: alert, cooperative and no distress Head: Normocephalic, without obvious abnormality, atraumatic Neck: no adenopathy, no carotid bruit, no JVD, supple, symmetrical, trachea midline and thyroid not enlarged, symmetric, no tenderness/mass/nodules Lungs: clear to auscultation bilaterally Heart: regular rate and rhythm, S1, S2 normal, no murmur, click, rub or gallop Abdomen: soft, non-tender; bowel sounds normal; no masses,  no organomegaly Extremities: extremities normal, atraumatic, no cyanosis or edema Skin: Skin color, texture, turgor normal. No rashes or lesions  Lab Results: Results for orders placed during the hospital encounter of 02/25/11 (from the past 48 hour(s))  CBC     Status: Abnormal   Collection Time   02/25/11  5:37 PM      Component Value Range Comment   WBC 7.0  4.0 - 10.5 (K/uL)    RBC 3.83 (*) 4.22 - 5.81 (MIL/uL)    Hemoglobin 12.5 (*) 13.0 - 17.0 (g/dL)    HCT 14.7 (*) 82.9 - 52.0 (%)    MCV 95.3  78.0 - 100.0 (fL)    MCH 32.6  26.0 - 34.0 (pg)    MCHC 34.2  30.0 - 36.0 (g/dL)    RDW 56.2 (*) 13.0 - 15.5 (%)    Platelets 121 (*) 150 - 400 (K/uL)   DIFFERENTIAL     Status: Abnormal   Collection Time   02/25/11  5:37 PM      Component Value Range Comment   Neutrophils Relative 69  43 - 77 (%)    Neutro Abs 4.8  1.7 - 7.7 (K/uL)    Lymphocytes Relative 14  12 - 46 (%)    Lymphs Abs 1.0  0.7 - 4.0 (K/uL)    Monocytes Relative 17 (*) 3 -  12 (%)    Monocytes Absolute 1.2 (*) 0.1 - 1.0 (K/uL)    Eosinophils Relative 0  0 - 5 (%)    Eosinophils Absolute 0.0  0.0 - 0.7 (K/uL)    Basophils Relative 0  0 - 1 (%)    Basophils Absolute 0.0  0.0 - 0.1 (K/uL)   CK TOTAL AND CKMB     Status: Normal   Collection Time   02/25/11  5:37 PM      Component Value Range Comment   Total CK 140  7 - 232 (U/L)    CK, MB 1.5  0.3 - 4.0 (ng/mL)    Relative Index 1.1  0.0 - 2.5    COMPREHENSIVE METABOLIC PANEL     Status: Abnormal   Collection Time   02/25/11  5:37 PM      Component Value Range Comment   Sodium 133 (*) 135 - 145 (mEq/L)    Potassium 3.4 (*) 3.5 - 5.1 (mEq/L)    Chloride 100  96 - 112 (mEq/L)    CO2 23  19 - 32 (mEq/L)    Glucose, Bld 105 (*) 70 - 99 (mg/dL)    BUN 9  6 - 23 (mg/dL)  Creatinine, Ser 0.90  0.50 - 1.35 (mg/dL)    Calcium 8.5  8.4 - 10.5 (mg/dL)    Total Protein 6.6  6.0 - 8.3 (g/dL)    Albumin 3.5  3.5 - 5.2 (g/dL)    AST 26  0 - 37 (U/L)    ALT 21  0 - 53 (U/L)    Alkaline Phosphatase 76  39 - 117 (U/L)    Total Bilirubin 0.3  0.3 - 1.2 (mg/dL)    GFR calc non Af Amer 89 (*) >90 (mL/min)    GFR calc Af Amer >90  >90 (mL/min)   TROPONIN I     Status: Normal   Collection Time   02/25/11  5:37 PM      Component Value Range Comment   Troponin I <0.30  <0.30 (ng/mL)   PROTIME-INR     Status: Normal   Collection Time   02/25/11  5:37 PM      Component Value Range Comment   Prothrombin Time 14.0  11.6 - 15.2 (seconds)    INR 1.06  0.00 - 1.49    APTT     Status: Normal   Collection Time   02/25/11  5:37 PM      Component Value Range Comment   aPTT 31  24 - 37 (seconds)   PHENYTOIN LEVEL, TOTAL     Status: Abnormal   Collection Time   02/25/11  6:33 PM      Component Value Range Comment   Phenytoin Lvl 6.6 (*) 10.0 - 20.0 (ug/mL)   INFLUENZA PANEL BY PCR     Status: Abnormal   Collection Time   02/26/11  3:11 AM      Component Value Range Comment   Influenza A By PCR POSITIVE (*) NEGATIVE       Influenza B By PCR NEGATIVE  NEGATIVE     H1N1 flu by pcr NOT DETECTED  NOT DETECTED    CBC     Status: Abnormal   Collection Time   02/26/11  6:30 AM      Component Value Range Comment   WBC 6.4  4.0 - 10.5 (K/uL)    RBC 3.71 (*) 4.22 - 5.81 (MIL/uL)    Hemoglobin 12.2 (*) 13.0 - 17.0 (g/dL)    HCT 16.1 (*) 09.6 - 52.0 (%)    MCV 96.5  78.0 - 100.0 (fL)    MCH 32.9  26.0 - 34.0 (pg)    MCHC 34.1  30.0 - 36.0 (g/dL)    RDW 04.5  40.9 - 81.1 (%)    Platelets 122 (*) 150 - 400 (K/uL)   BASIC METABOLIC PANEL     Status: Abnormal   Collection Time   02/26/11  6:30 AM      Component Value Range Comment   Sodium 135  135 - 145 (mEq/L)    Potassium 3.3 (*) 3.5 - 5.1 (mEq/L)    Chloride 104  96 - 112 (mEq/L)    CO2 24  19 - 32 (mEq/L)    Glucose, Bld 94  70 - 99 (mg/dL)    BUN 9  6 - 23 (mg/dL)    Creatinine, Ser 9.14  0.50 - 1.35 (mg/dL)    Calcium 8.2 (*) 8.4 - 10.5 (mg/dL)    GFR calc non Af Amer >90  >90 (mL/min)    GFR calc Af Amer >90  >90 (mL/min)     Micro Results: No results found for this or any previous visit (from  the past 240 hour(s)).  Studies/Results: Dg Chest 2 View  02/25/2011  *RADIOLOGY REPORT*  Clinical Data: Syncope.  CHEST - 2 VIEW  Comparison: None  Findings: The cardiac silhouette, mediastinal and hilar contours are within normal limits.  The lungs are clear.  Very small pleural effusions are suspected.  The bony thorax is intact.  IMPRESSION: Probable small pleural effusions.  No infiltrates, edema or pneumothorax.  Original Report Authenticated By: P. Loralie Champagne, M.D.   Ct Head Wo Contrast  02/25/2011  *RADIOLOGY REPORT*  Clinical Data: Fall last night  CT HEAD WITHOUT CONTRAST  Technique:  Contiguous axial images were obtained from the base of the skull through the vertex without contrast.  Comparison: 09/07/2010  Findings: There is diffuse patchy low density throughout the subcortical and periventricular white matter consistent with chronic small  vessel ischemic change.  There is prominence of the sulci and ventricles consistent with brain atrophy.  On images number 14-12 there is abnormal, asymmetric hyperdensity overlying the sulci of the left frontal lobe consistent with subarachnoid hemorrhage.  Focal area of parenchymal hyperintensity with surrounding edema is noted measuring 4.3 mm.  This is consistent with a small parenchymal hematoma.  No significant mass effect or midline shift.  The paranasal sinuses are clear.  The mastoid air cells are clear. The skull appears intact.  IMPRESSION:  1.  Left frontal lobe of hyper densities are identified and suspicious for a parenchymal contusion and hemorrhage as well as a small amount of subarachnoid hemorrhage. 2.  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  Critical Value/emergent results were called by telephone at the time of interpretation on 02/25/11  at 4:00 p.m.  to  Beverely Low, MD, who verbally acknowledged these results.  Original Report Authenticated By: Rosealee Albee, M.D.   Medications: Scheduled Meds:   . albuterol  2.5 mg Nebulization Q4H  .  HYDROmorphone (DILAUDID) injection  1 mg Intravenous Once  . ipratropium  0.5 mg Nebulization Q4H  . niMODipine  30 mg Oral Q4H  . ondansetron (ZOFRAN) IV  4 mg Intravenous Once  . phenytoin (DILANTIN) IV  500 mg Intravenous Once  . phenytoin  200 mg Oral QPM  . phenytoin  300 mg Oral Q0700  . potassium chloride  10 mEq Oral BID  . DISCONTD: fosPHENYtoin (CEREBYX) IV  500 mg PE Intravenous Once   Continuous Infusions:   . sodium chloride 100 mL/hr at 02/26/11 0102   PRN Meds:.acetaminophen, acetaminophen, ondansetron (ZOFRAN) IV, ondansetron, oxyCODONE  Assessment/Plan: #1 URTI-most likely viral in origin.Will however add breathing treatment to regimen #2 Subarachnoid hemorrhage-I was informed by Dr Roselie Awkward that ED physician discussed this with the neuro-surgeon on call and he said that the [facility] was non surgical and that patient  should be managed medically.Will add nimodipine to regimen. #3 HYPERTENSION-Bp fairly stable. #4 SEIZURE DISORDER-will continue dilantin.   LOS: 1 day   Amory Simonetti 02/26/2011, 9:20 AM

## 2011-02-26 NOTE — H&P (Signed)
PCP:   Joel Ora, MD, MD   Chief Complaint:  Syncopal episode with resultant intracerebral trauma  HPI: [name] with h/o seizure disorder, HTN, and right sided sensorineural hearing  loss presents with cough, congestion, syncope and fall, and found to have fever, small cerebral parenchymal contusion, hemorrhage, and subarachnoid  hemorrhage  Pt states that Wednesday he developed dry cough, chest congestion, nausea  and vomiting x1, decreased appetite.  Went to bed that night and had sweats  but no documented fever. Around 230a, he woke up and went to the bathroom,  and was sitting down to pee, without any straining to have a BM or urinate,  and next thing he knows he wakes up on the floor of the bathroom with a bump  on his head. He remembers where he was, knew exactly what had happened, but  difficult to say how long he was down. He went back to bed, noted blood on  pillow. He denies any symptoms beforehand, no dizziness, CP, SOB,  diaphoresis, clamminess, other vagal symtpoms.   Went to see PCP today, who did ear exam and felt left TM was dull,  erythematous, with fluid noted, and diagnosed otitis media for which he was  Rx'd Amoxicillin. Syncope was thought due to this OM and low blood sugar,  but given bruise and TTP on back of head, pt was sent to CT scan of head  which showed small vessel changes, but also with left frontal lobe  hyperdensities concerning for parenchymal contustion and hemorrhage (4.3 mm  with surrounding edema), and small amt of subarachnoid hemorrhage. Pt called  back and sent to [facility].   While there, pt was febrile to 101.4, but other vitals were stable. Chem  panel with Na 133, K 3.4, renal 9/0.9, LFT's normal. Cardiac enzymes  negative x1. WBC 7.0 with elevated monocyte count, Hct 36.5, plts 121. INR  1.06. Dilantin level 6.6 (10-20). BCx x2 pending. CXR with probably very  small pleural effusions. Dr. Venetia Maxon reviewed the images of his head, said  nothing  surgical. Pt was given 500 mg IV Dilantin, Zofran, Dilaudid.    ROS as above, o/w pt also endorses dehydration bc he works out 7d/wk and  doesn't get enough water. Also endorses taking Lasix. Otherwise, currently  have left sided headache, but no tingling, numbness, focal neuro deficits,  blurry vision, or any other neuro deficits. No weakness. No diarrhea or abd  pain through all this. ROS o/w negative all systems.   Past Medical History  Diagnosis Date  . Seizure disorder     Dx remotely, last SZ activity aprox 1998  . Back pain     HNP dx ~2008, no surgery  . HTN (hypertension)   . Hearing loss     Rt ear sensorineural hearing loss, with hearing aide. W/u by Dr. Molli Barrows extensively negative    Past Surgical History  Procedure Date  . Hernia repair     as an infant    Medications:  HOME MEDS:  Reconciled with patient  Prior to Admission medications   Medication Sig Start Date End Date Taking? Authorizing Provider  aspirin 81 MG tablet Take 81 mg by mouth daily.     Yes Historical Provider, MD  AZOR 10-40 MG per tablet take 1 tablet by mouth once daily 09/14/10  Yes Wanda Plump, MD  calcium carbonate (CALCIUM 500) 1250 MG tablet Take 1 tablet by mouth daily.     Yes Historical Provider, MD  Chlorpheniramine-APAP (CORICIDIN)  2-325 MG TABS Take 2 tablets by mouth every 6 (six) hours as needed. For congestion    Yes Historical Provider, MD  furosemide (LASIX) 40 MG tablet Take 20 mg by mouth 2 (two) times daily.    Yes Historical Provider, MD  multivitamin [facility]) per tablet Take 1 tablet by mouth daily.     Yes Historical Provider, MD  Omega-3 Fatty Acids (FISH OIL) 1200 MG CAPS Take 1,200 mg by mouth 2 (two) times daily.    Yes Historical Provider, MD  phenytoin (DILANTIN) 100 MG ER capsule Take 200 mg by mouth 2 (two) times daily.    Yes Historical Provider, MD  amoxicillin (AMOXIL) 500 MG capsule Take 1 capsule (500 mg total) by mouth 2 (two) times daily. 02/25/11 03/07/11   Neena Rhymes, MD    Allergies:  No Known Allergies  Social History:  Reconciled with patient   reports that he has never smoked. He has never used smokeless tobacco. He reports that he drinks alcohol. He reports that he does not use illicit drugs.  Family History: Family History  Problem Relation Age of Onset  . Emphysema Mother     smoker  . Hypertension Father     F and B   . Leukemia Sister   . Diabetes Neg Hx   . Prostate cancer Brother 26  . Colon cancer Neg Hx   . Coronary artery disease Neg Hx     Physical Exam: Filed Vitals:   02/25/11 1752 02/25/11 2000 02/25/11 2109 02/25/11 2230  BP: 150/89 159/94 134/81 141/84  Pulse: 87 72 72 66  Temp:  100.2 F (37.9 C) 99.7 F (37.6 C) 98.9 F (37.2 C)  TempSrc:  Oral Oral Oral  Resp: 20 16 16 18   Height:      Weight:      SpO2: 99% 96% 94% 96%   Blood pressure 141/84, pulse 66, temperature 98.9 F (37.2 C), temperature source Oral, resp. rate 18, height 6\' 1"  (1.854 m), weight 93.895 kg (207 lb), SpO2 96.00%.   Gen: Healthy appearing, younger than stated age M in no distress, good  historian, clear speech, fluent, no cardiopulmonary distress HEENT: PERRL ~28mm bilaterally, EOMI, sclera clear, normal appearing. Mouth  normal appearing as well, not dry.  Neck: Supple, normal Lungs: CTAB no w/c/r, overall normal Heart: S1/2 clear, no m/g, bening exam Abd: Soft, not obese, not distended, not tender, benign Extrem: Warm, perfusing, radials palpable, normal, no BLE edema Neuro: Alert, conversant, pleasant, CN 2-12 intact, moving extremities  spontaneously without difficulty, grossly non-focal, speech clear, no facial  droop, tongue midline, no tremors.   Labs & Imaging Results for orders placed during the hospital encounter of 02/25/11 (from the past 48 hour(s))  CBC     Status: Abnormal   Collection Time   02/25/11  5:37 PM      Component Value Range Comment   WBC 7.0  4.0 - 10.5 (K/uL)    RBC 3.83 (*) 4.22 -  5.81 (MIL/uL)    Hemoglobin 12.5 (*) 13.0 - 17.0 (g/dL)    HCT 40.9 (*) 81.1 - 52.0 (%)    MCV 95.3  78.0 - 100.0 (fL)    MCH 32.6  26.0 - 34.0 (pg)    MCHC 34.2  30.0 - 36.0 (g/dL)    RDW 91.4 (*) 78.2 - 15.5 (%)    Platelets 121 (*) 150 - 400 (K/uL)   DIFFERENTIAL     Status: Abnormal   Collection Time  02/25/11  5:37 PM      Component Value Range Comment   Neutrophils Relative 69  43 - 77 (%)    Neutro Abs 4.8  1.7 - 7.7 (K/uL)    Lymphocytes Relative 14  12 - 46 (%)    Lymphs Abs 1.0  0.7 - 4.0 (K/uL)    Monocytes Relative 17 (*) 3 - 12 (%)    Monocytes Absolute 1.2 (*) 0.1 - 1.0 (K/uL)    Eosinophils Relative 0  0 - 5 (%)    Eosinophils Absolute 0.0  0.0 - 0.7 (K/uL)    Basophils Relative 0  0 - 1 (%)    Basophils Absolute 0.0  0.0 - 0.1 (K/uL)   CK TOTAL AND CKMB     Status: Normal   Collection Time   02/25/11  5:37 PM      Component Value Range Comment   Total CK 140  7 - 232 (U/L)    CK, MB 1.5  0.3 - 4.0 (ng/mL)    Relative Index 1.1  0.0 - 2.5    COMPREHENSIVE METABOLIC PANEL     Status: Abnormal   Collection Time   02/25/11  5:37 PM      Component Value Range Comment   Sodium 133 (*) 135 - 145 (mEq/L)    Potassium 3.4 (*) 3.5 - 5.1 (mEq/L)    Chloride 100  96 - 112 (mEq/L)    CO2 23  19 - 32 (mEq/L)    Glucose, Bld 105 (*) 70 - 99 (mg/dL)    BUN 9  6 - 23 (mg/dL)    Creatinine, Ser 1.61  0.50 - 1.35 (mg/dL)    Calcium 8.5  8.4 - 10.5 (mg/dL)    Total Protein 6.6  6.0 - 8.3 (g/dL)    Albumin 3.5  3.5 - 5.2 (g/dL)    AST 26  0 - 37 (U/L)    ALT 21  0 - 53 (U/L)    Alkaline Phosphatase 76  39 - 117 (U/L)    Total Bilirubin 0.3  0.3 - 1.2 (mg/dL)    GFR calc non Af Amer 89 (*) >90 (mL/min)    GFR calc Af Amer >90  >90 (mL/min)   TROPONIN I     Status: Normal   Collection Time   02/25/11  5:37 PM      Component Value Range Comment   Troponin I <0.30  <0.30 (ng/mL)   PROTIME-INR     Status: Normal   Collection Time   02/25/11  5:37 PM      Component  Value Range Comment   Prothrombin Time 14.0  11.6 - 15.2 (seconds)    INR 1.06  0.00 - 1.49    APTT     Status: Normal   Collection Time   02/25/11  5:37 PM      Component Value Range Comment   aPTT 31  24 - 37 (seconds)   PHENYTOIN LEVEL, TOTAL     Status: Abnormal   Collection Time   02/25/11  6:33 PM      Component Value Range Comment   Phenytoin Lvl 6.6 (*) 10.0 - 20.0 (ug/mL)    Dg Chest 2 View  02/25/2011  *RADIOLOGY REPORT*  Clinical Data: Syncope.  CHEST - 2 VIEW  Comparison: None  Findings: The cardiac silhouette, mediastinal and hilar contours are within normal limits.  The lungs are clear.  Very small pleural effusions are suspected.  The bony thorax is intact.  IMPRESSION: Probable small pleural effusions.  No infiltrates, edema or pneumothorax.  Original Report Authenticated By: P. Loralie Champagne, M.D.   Ct Head Wo Contrast  02/25/2011  *RADIOLOGY REPORT*  Clinical Data: Fall last night  CT HEAD WITHOUT CONTRAST  Technique:  Contiguous axial images were obtained from the base of the skull through the vertex without contrast.  Comparison: 09/07/2010  Findings: There is diffuse patchy low density throughout the subcortical and periventricular white matter consistent with chronic small vessel ischemic change.  There is prominence of the sulci and ventricles consistent with brain atrophy.  On images number 14-12 there is abnormal, asymmetric hyperdensity overlying the sulci of the left frontal lobe consistent with subarachnoid hemorrhage.  Focal area of parenchymal hyperintensity with surrounding edema is noted measuring 4.3 mm.  This is consistent with a small parenchymal hematoma.  No significant mass effect or midline shift.  The paranasal sinuses are clear.  The mastoid air cells are clear. The skull appears intact.  IMPRESSION:  1.  Left frontal lobe of hyper densities are identified and suspicious for a parenchymal contusion and hemorrhage as well as a small amount of subarachnoid  hemorrhage. 2.  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  Critical Value/emergent results were called by telephone at the time of interpretation on 02/25/11  at 4:00 p.m.  to  Beverely Low, MD, who verbally acknowledged these results.  Original Report Authenticated By: Rosealee Albee, M.D.    Impression Present on Admission:  .Subarachnoid hemorrhage .Fever .HYPERTENSION .SEIZURE DISORDER .Syncope  [name] with h/o seizure disorder, HTN, and right sided sensorineural hearing  loss presents with cough, congestion, syncope and fall, and found to have fever, small cerebral parenchymal contusion, hemorrhage, and subarachnoid  hemorrhage  1. Subarachnoid hemorrhage, parenchymal contusion: The contusion/hemorrhage is 4mm, quite small, and SAH is small as well. No focal neuro deficits at  present, but is having left sided h/a. Per Dr. Venetia Maxon in [facility], not surgical  issue.   - Neuro exams, monitoring for now, holding home ASA 81 - Repeat head CT tomorrow   2. Syncope: Suspect overall this was hypovolemic vs vasovagal in setting of  viral syndrome/fevers, subjective dehydration, patient is on Lasix, and  probably got up quickly in the middle of the night while sleeping and having  excessive vagal tone. DDx includes seizure, but given lack of any postictal  period, tend to doubt this, and pt does not feel this was seizure either,  which he's had before. Given lack of premontory cardiopulmonary symptoms,  tend to doubt more serious pathology.   - IVF's overnight, holding Azor, Lasix for now   3. Fevers: No leukocytosis, but differential shows monocytosis which is usually seen in viral syndromes. Pt endorsing dry cough, congestion, but CXR shows no PNA, only "very small probably pleural effusions" which we will just monitor. F/u BCx's, get UA to complete infectious w/u. Of note, do not  believe pt has left sided otitis media as previously thought, because TM by  my exam fairly normal, and pt  lacks any symtpoms to support this.   - UA, hold on ABx, Tylenol for fevers, influenza swab  4. H/o seizure disorder: Dilantin was subtherapeutic, was loaded, will increase oral dose.   - Increase to Dilantin 300 am, 200 pm per discussion with pharmacy  5. HTN: Holding ASA 81, Azor, Lasix   Full code, discussed  Telemetry, MC team 7  Other plans as per orders.  Gaylan Fauver 02/26/2011, 12:55 AM

## 2011-02-27 LAB — COMPREHENSIVE METABOLIC PANEL
ALT: 16 U/L (ref 0–53)
AST: 20 U/L (ref 0–37)
Albumin: 2.9 g/dL — ABNORMAL LOW (ref 3.5–5.2)
Alkaline Phosphatase: 73 U/L (ref 39–117)
Potassium: 3.3 mEq/L — ABNORMAL LOW (ref 3.5–5.1)
Sodium: 135 mEq/L (ref 135–145)
Total Protein: 6.2 g/dL (ref 6.0–8.3)

## 2011-02-27 LAB — CBC
MCHC: 33.8 g/dL (ref 30.0–36.0)
RDW: 11.9 % (ref 11.5–15.5)
WBC: 8.7 10*3/uL (ref 4.0–10.5)

## 2011-02-27 MED ORDER — ALBUTEROL SULFATE (5 MG/ML) 0.5% IN NEBU
2.5000 mg | INHALATION_SOLUTION | Freq: Three times a day (TID) | RESPIRATORY_TRACT | Status: DC
  Administered 2011-02-27 – 2011-02-28 (×3): 2.5 mg via RESPIRATORY_TRACT
  Filled 2011-02-27 (×3): qty 0.5

## 2011-02-27 MED ORDER — IPRATROPIUM BROMIDE 0.02 % IN SOLN
0.5000 mg | Freq: Three times a day (TID) | RESPIRATORY_TRACT | Status: DC
  Administered 2011-02-27 – 2011-02-28 (×3): 0.5 mg via RESPIRATORY_TRACT
  Filled 2011-02-27 (×3): qty 2.5

## 2011-02-27 MED ORDER — ACETAMINOPHEN 325 MG PO TABS
650.0000 mg | ORAL_TABLET | ORAL | Status: DC | PRN
  Administered 2011-02-27 – 2011-02-28 (×4): 650 mg via ORAL
  Filled 2011-02-27 (×4): qty 2

## 2011-02-27 MED ORDER — SENNOSIDES-DOCUSATE SODIUM 8.6-50 MG PO TABS
1.0000 | ORAL_TABLET | Freq: Two times a day (BID) | ORAL | Status: DC
  Administered 2011-02-27 – 2011-03-02 (×7): 1 via ORAL
  Filled 2011-02-27 (×7): qty 1

## 2011-02-27 MED ORDER — OXYMETAZOLINE HCL 0.05 % NA SOLN
1.0000 | Freq: Two times a day (BID) | NASAL | Status: DC | PRN
Start: 2011-02-27 — End: 2011-03-02
  Administered 2011-02-27: 1 via NASAL
  Filled 2011-02-27: qty 15

## 2011-02-27 MED ORDER — PHENYTOIN SODIUM EXTENDED 100 MG PO CAPS
300.0000 mg | ORAL_CAPSULE | Freq: Every evening | ORAL | Status: DC
  Filled 2011-02-27: qty 3

## 2011-02-27 MED ORDER — PHENYTOIN SODIUM EXTENDED 100 MG PO CAPS
300.0000 mg | ORAL_CAPSULE | Freq: Every evening | ORAL | Status: DC
  Administered 2011-02-27 – 2011-03-02 (×4): 300 mg via ORAL
  Filled 2011-02-27 (×4): qty 3

## 2011-02-27 MED ORDER — POTASSIUM CHLORIDE CRYS ER 10 MEQ PO TBCR: 10.0000 meq | EXTENDED_RELEASE_TABLET | Freq: Two times a day (BID) | ORAL | Status: DC

## 2011-02-27 MED ORDER — ACETAMINOPHEN 650 MG RE SUPP: 650.0000 mg | Freq: Four times a day (QID) | RECTAL | Status: DC | PRN

## 2011-02-27 NOTE — Plan of Care (Signed)
Problem: Phase I Progression Outcomes Goal: OOB as tolerated unless otherwise ordered Outcome: Not Progressing Patient having difficulty with pain control; only able to get up and ambulate to bathroom and return to bed.

## 2011-02-27 NOTE — Progress Notes (Signed)
Patient ID: Joel [name], male   DOB: March 12, 1946, 65 y.o.   MRN: [id number] Patient ID: Joel [name], male   DOB: 1945/07/14, 65 y.o.   MRN: [id number] Subjective: Patient seen.Feels better.Found to be flu positive yesterday and started on tamiflu.Denies any headaches  Objective: Weight change:   Intake/Output Summary (Last 24 hours) at 02/27/11 1328 Last data filed at 02/26/11 2300  Gross per 24 hour  Intake    640 ml  Output      0 ml  Net    640 ml   BP 137/74  Pulse 67  Temp(Src) 99.8 F (37.7 C) (Oral)  Resp 20  Ht 6\' 1"  (1.854 m)  Wt 93.895 kg (207 lb)  BMI 27.31 kg/m2  SpO2 99% Physical Exam: General appearance: alert, cooperative and no distress Head: Normocephalic, without obvious abnormality, atraumatic Neck: no adenopathy, no carotid bruit, no JVD, supple, symmetrical, trachea midline and thyroid not enlarged, symmetric, no tenderness/mass/nodules Lungs: clear to auscultation bilaterally Heart: regular rate and rhythm, S1, S2 normal, no murmur, click, rub or gallop Abdomen: soft, non-tender; bowel sounds normal; no masses,  no organomegaly Extremities: extremities normal, atraumatic, no cyanosis or edema Skin: Skin color, texture, turgor normal. No rashes or lesions  Lab Results: Results for orders placed during the hospital encounter of 02/25/11 (from the past 48 hour(s))  CBC     Status: Abnormal   Collection Time   02/25/11  5:37 PM      Component Value Range Comment   WBC 7.0  4.0 - 10.5 (K/uL)    RBC 3.83 (*) 4.22 - 5.81 (MIL/uL)    Hemoglobin 12.5 (*) 13.0 - 17.0 (g/dL)    HCT 78.2 (*) 95.6 - 52.0 (%)    MCV 95.3  78.0 - 100.0 (fL)    MCH 32.6  26.0 - 34.0 (pg)    MCHC 34.2  30.0 - 36.0 (g/dL)    RDW 21.3 (*) 08.6 - 15.5 (%)    Platelets 121 (*) 150 - 400 (K/uL)   DIFFERENTIAL     Status: Abnormal   Collection Time   02/25/11  5:37 PM      Component Value Range Comment   Neutrophils Relative 69  43 - 77 (%)    Neutro Abs 4.8  1.7 - 7.7  (K/uL)    Lymphocytes Relative 14  12 - 46 (%)    Lymphs Abs 1.0  0.7 - 4.0 (K/uL)    Monocytes Relative 17 (*) 3 - 12 (%)    Monocytes Absolute 1.2 (*) 0.1 - 1.0 (K/uL)    Eosinophils Relative 0  0 - 5 (%)    Eosinophils Absolute 0.0  0.0 - 0.7 (K/uL)    Basophils Relative 0  0 - 1 (%)    Basophils Absolute 0.0  0.0 - 0.1 (K/uL)   CK TOTAL AND CKMB     Status: Normal   Collection Time   02/25/11  5:37 PM      Component Value Range Comment   Total CK 140  7 - 232 (U/L)    CK, MB 1.5  0.3 - 4.0 (ng/mL)    Relative Index 1.1  0.0 - 2.5    COMPREHENSIVE METABOLIC PANEL     Status: Abnormal   Collection Time   02/25/11  5:37 PM      Component Value Range Comment   Sodium 133 (*) 135 - 145 (mEq/L)    Potassium 3.4 (*) 3.5 - 5.1 (mEq/L)  Chloride 100  96 - 112 (mEq/L)    CO2 23  19 - 32 (mEq/L)    Glucose, Bld 105 (*) 70 - 99 (mg/dL)    BUN 9  6 - 23 (mg/dL)    Creatinine, Ser 2.95  0.50 - 1.35 (mg/dL)    Calcium 8.5  8.4 - 10.5 (mg/dL)    Total Protein 6.6  6.0 - 8.3 (g/dL)    Albumin 3.5  3.5 - 5.2 (g/dL)    AST 26  0 - 37 (U/L)    ALT 21  0 - 53 (U/L)    Alkaline Phosphatase 76  39 - 117 (U/L)    Total Bilirubin 0.3  0.3 - 1.2 (mg/dL)    GFR calc non Af Amer 89 (*) >90 (mL/min)    GFR calc Af Amer >90  >90 (mL/min)   TROPONIN I     Status: Normal   Collection Time   02/25/11  5:37 PM      Component Value Range Comment   Troponin I <0.30  <0.30 (ng/mL)   PROTIME-INR     Status: Normal   Collection Time   02/25/11  5:37 PM      Component Value Range Comment   Prothrombin Time 14.0  11.6 - 15.2 (seconds)    INR 1.06  0.00 - 1.49    APTT     Status: Normal   Collection Time   02/25/11  5:37 PM      Component Value Range Comment   aPTT 31  24 - 37 (seconds)   PHENYTOIN LEVEL, TOTAL     Status: Abnormal   Collection Time   02/25/11  6:33 PM      Component Value Range Comment   Phenytoin Lvl 6.6 (*) 10.0 - 20.0 (ug/mL)   INFLUENZA PANEL BY PCR     Status: Abnormal    Collection Time   02/26/11  3:11 AM      Component Value Range Comment   Influenza A By PCR POSITIVE (*) NEGATIVE     Influenza B By PCR NEGATIVE  NEGATIVE     H1N1 flu by pcr NOT DETECTED  NOT DETECTED    CBC     Status: Abnormal   Collection Time   02/26/11  6:30 AM      Component Value Range Comment   WBC 6.4  4.0 - 10.5 (K/uL)    RBC 3.71 (*) 4.22 - 5.81 (MIL/uL)    Hemoglobin 12.2 (*) 13.0 - 17.0 (g/dL)    HCT 28.4 (*) 13.2 - 52.0 (%)    MCV 96.5  78.0 - 100.0 (fL)    MCH 32.9  26.0 - 34.0 (pg)    MCHC 34.1  30.0 - 36.0 (g/dL)    RDW 44.0  10.2 - 72.5 (%)    Platelets 122 (*) 150 - 400 (K/uL)   BASIC METABOLIC PANEL     Status: Abnormal   Collection Time   02/26/11  6:30 AM      Component Value Range Comment   Sodium 135  135 - 145 (mEq/L)    Potassium 3.3 (*) 3.5 - 5.1 (mEq/L)    Chloride 104  96 - 112 (mEq/L)    CO2 24  19 - 32 (mEq/L)    Glucose, Bld 94  70 - 99 (mg/dL)    BUN 9  6 - 23 (mg/dL)    Creatinine, Ser 3.66  0.50 - 1.35 (mg/dL)    Calcium 8.2 (*) 8.4 - 10.5 (  mg/dL)    GFR calc non Af Amer >90  >90 (mL/min)    GFR calc Af Amer >90  >90 (mL/min)     Micro Results: Recent Results (from the past 240 hour(s))  CULTURE, BLOOD (ROUTINE X 2)     Status: Normal (Preliminary result)   Collection Time   02/25/11  6:15 PM      Component Value Range Status Comment   Specimen Description BLOOD RIGHT HAND   Final    Special Requests NONE BOTTLES DRAWN AEROBIC AND ANAEROBIC 5CC   Final    Setup Time [id number]   Final    Culture     Final    Value:        BLOOD CULTURE RECEIVED NO GROWTH TO DATE CULTURE WILL BE HELD FOR 5 DAYS BEFORE ISSUING A FINAL NEGATIVE REPORT   Report Status PENDING   Incomplete   CULTURE, BLOOD (ROUTINE X 2)     Status: Normal (Preliminary result)   Collection Time   02/25/11  6:15 PM      Component Value Range Status Comment   Specimen Description BLOOD LEFT FOREARM   Final    Special Requests NONE BOTTLES DRAWN AEROBIC AND ANAEROBIC  5CC EACH   Final    Setup Time [id number]   Final    Culture     Final    Value:        BLOOD CULTURE RECEIVED NO GROWTH TO DATE CULTURE WILL BE HELD FOR 5 DAYS BEFORE ISSUING A FINAL NEGATIVE REPORT   Report Status PENDING   Incomplete     Studies/Results: Dg Chest 2 View  02/25/2011  *RADIOLOGY REPORT*  Clinical Data: Syncope.  CHEST - 2 VIEW  Comparison: None  Findings: The cardiac silhouette, mediastinal and hilar contours are within normal limits.  The lungs are clear.  Very small pleural effusions are suspected.  The bony thorax is intact.  IMPRESSION: Probable small pleural effusions.  No infiltrates, edema or pneumothorax.  Original Report Authenticated By: P. Loralie Champagne, M.D.   Ct Head Wo Contrast  02/25/2011  *RADIOLOGY REPORT*  Clinical Data: Fall last night  CT HEAD WITHOUT CONTRAST  Technique:  Contiguous axial images were obtained from the base of the skull through the vertex without contrast.  Comparison: 09/07/2010  Findings: There is diffuse patchy low density throughout the subcortical and periventricular white matter consistent with chronic small vessel ischemic change.  There is prominence of the sulci and ventricles consistent with brain atrophy.  On images number 14-12 there is abnormal, asymmetric hyperdensity overlying the sulci of the left frontal lobe consistent with subarachnoid hemorrhage.  Focal area of parenchymal hyperintensity with surrounding edema is noted measuring 4.3 mm.  This is consistent with a small parenchymal hematoma.  No significant mass effect or midline shift.  The paranasal sinuses are clear.  The mastoid air cells are clear. The skull appears intact.  IMPRESSION:  1.  Left frontal lobe of hyper densities are identified and suspicious for a parenchymal contusion and hemorrhage as well as a small amount of subarachnoid hemorrhage. 2.  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  Critical Value/emergent results were called by telephone at the  time of interpretation on 02/25/11  at 4:00 p.m.  to  Beverely Low, MD, who verbally acknowledged these results.  Original Report Authenticated By: Rosealee Albee, M.D.   Medications: Scheduled Meds:   . albuterol  2.5 mg Nebulization Q4H  .  HYDROmorphone (DILAUDID) injection  1 mg  Intravenous Once  . ipratropium  0.5 mg Nebulization Q4H  . niMODipine  30 mg Oral Q4H  . ondansetron (ZOFRAN) IV  4 mg Intravenous Once  . phenytoin (DILANTIN) IV  500 mg Intravenous Once  . phenytoin  200 mg Oral QPM  . phenytoin  300 mg Oral Q0700  . potassium chloride  10 mEq Oral BID  . DISCONTD: fosPHENYtoin (CEREBYX) IV  500 mg PE Intravenous Once   Continuous Infusions:    . sodium chloride 100 mL/hr at 02/27/11 0123   PRN Meds:.acetaminophen, acetaminophen, morphine injection, ondansetron (ZOFRAN) IV, ondansetron, oxyCODONE, oxymetazoline  Assessment/Plan: #1 URTI(FLU+)-will continue tamiflu #2 Subarachnoid hemorrhage-I was informed by Dr Roselie Awkward that ED physician discussed this with the neuro-surgeon on call and he said that the [facility] was non surgical and that patient should be managed medically.Will continue nimodipine #3 HYPERTENSION-Bp fairly stable. #4 SEIZURE DISORDER-will continue dilantin. #5 Hypokalemia-k repletion   LOS: 2 days   Analynn Daum 02/27/2011, 1:28 PM

## 2011-02-27 NOTE — Plan of Care (Signed)
Problem: Phase I Progression Outcomes Goal: Hemodynamically stable Outcome: Progressing Patient vitals remain stable for shift.

## 2011-02-28 MED ORDER — ALBUTEROL SULFATE (5 MG/ML) 0.5% IN NEBU: 2.5000 mg | INHALATION_SOLUTION | Freq: Four times a day (QID) | RESPIRATORY_TRACT | Status: DC | PRN

## 2011-02-28 MED ORDER — POTASSIUM CHLORIDE CRYS ER 20 MEQ PO TBCR
20.0000 meq | EXTENDED_RELEASE_TABLET | Freq: Two times a day (BID) | ORAL | Status: DC
  Administered 2011-02-28 – 2011-03-02 (×4): 20 meq via ORAL
  Filled 2011-02-28 (×5): qty 1

## 2011-02-28 MED ORDER — IBUPROFEN 400 MG PO TABS
400.0000 mg | ORAL_TABLET | Freq: Three times a day (TID) | ORAL | Status: DC
Start: 2011-02-28 — End: 2011-03-02
  Administered 2011-02-28 – 2011-03-02 (×7): 400 mg via ORAL
  Filled 2011-02-28 (×8): qty 1

## 2011-02-28 MED ORDER — IPRATROPIUM BROMIDE 0.02 % IN SOLN: 0.5000 mg | Freq: Four times a day (QID) | RESPIRATORY_TRACT | Status: DC | PRN

## 2011-02-28 MED ORDER — DEXAMETHASONE SODIUM PHOSPHATE 4 MG/ML IJ SOLN
4.0000 mg | Freq: Four times a day (QID) | INTRAMUSCULAR | Status: DC
  Administered 2011-02-28 – 2011-03-02 (×10): 4 mg via INTRAVENOUS
  Filled 2011-02-28 (×13): qty 1

## 2011-02-28 NOTE — Progress Notes (Signed)
Patient ID: Joel [name], male   DOB: 14-Dec-1945, 65 y.o.   MRN: [id number] Patient ID: Joel [name], male   DOB: 1945-07-14, 65 y.o.   MRN: [id number] Patient ID: Joel [name], male   DOB: 08-25-1945, 65 y.o.   MRN: [id number] Subjective: Patient seen.Complain of frontal headache.No neck stiffness  Objective: Weight change:   Intake/Output Summary (Last 24 hours) at 02/28/11 1233 Last data filed at 02/27/11 1500  Gross per 24 hour  Intake    240 ml  Output      0 ml  Net    240 ml   BP 147/91  Pulse 60  Temp(Src) 98.5 F (36.9 C) (Oral)  Resp 18  Ht 6\' 1"  (1.854 m)  Wt 93.895 kg (207 lb)  BMI 27.31 kg/m2  SpO2 97% Physical Exam: General appearance: alert, cooperative and no distress Head: Normocephalic, without obvious abnormality, atraumatic Neck: no adenopathy, no carotid bruit, no JVD, supple, symmetrical, trachea midline and thyroid not enlarged, symmetric, no tenderness/mass/nodules Lungs: clear to auscultation bilaterally Heart: regular rate and rhythm, S1, S2 normal, no murmur, click, rub or gallop Abdomen: soft, non-tender; bowel sounds normal; no masses,  no organomegaly Extremities: extremities normal, atraumatic, no cyanosis or edema Skin: Skin color, texture, turgor normal. No rashes or lesions Neuro-no nuchal rigidity.non focal  Lab Results: Results for orders placed during the hospital encounter of 02/25/11 (from the past 48 hour(s))  CBC     Status: Abnormal   Collection Time   02/25/11  5:37 PM      Component Value Range Comment   WBC 7.0  4.0 - 10.5 (K/uL)    RBC 3.83 (*) 4.22 - 5.81 (MIL/uL)    Hemoglobin 12.5 (*) 13.0 - 17.0 (g/dL)    HCT 08.6 (*) 57.8 - 52.0 (%)    MCV 95.3  78.0 - 100.0 (fL)    MCH 32.6  26.0 - 34.0 (pg)    MCHC 34.2  30.0 - 36.0 (g/dL)    RDW 46.9 (*) 62.9 - 15.5 (%)    Platelets 121 (*) 150 - 400 (K/uL)   DIFFERENTIAL     Status: Abnormal   Collection Time   02/25/11  5:37 PM      Component Value Range Comment     Neutrophils Relative 69  43 - 77 (%)    Neutro Abs 4.8  1.7 - 7.7 (K/uL)    Lymphocytes Relative 14  12 - 46 (%)    Lymphs Abs 1.0  0.7 - 4.0 (K/uL)    Monocytes Relative 17 (*) 3 - 12 (%)    Monocytes Absolute 1.2 (*) 0.1 - 1.0 (K/uL)    Eosinophils Relative 0  0 - 5 (%)    Eosinophils Absolute 0.0  0.0 - 0.7 (K/uL)    Basophils Relative 0  0 - 1 (%)    Basophils Absolute 0.0  0.0 - 0.1 (K/uL)   CK TOTAL AND CKMB     Status: Normal   Collection Time   02/25/11  5:37 PM      Component Value Range Comment   Total CK 140  7 - 232 (U/L)    CK, MB 1.5  0.3 - 4.0 (ng/mL)    Relative Index 1.1  0.0 - 2.5    COMPREHENSIVE METABOLIC PANEL     Status: Abnormal   Collection Time   02/25/11  5:37 PM      Component Value Range Comment   Sodium 133 (*) 135 - 145 (  mEq/L)    Potassium 3.4 (*) 3.5 - 5.1 (mEq/L)    Chloride 100  96 - 112 (mEq/L)    CO2 23  19 - 32 (mEq/L)    Glucose, Bld 105 (*) 70 - 99 (mg/dL)    BUN 9  6 - 23 (mg/dL)    Creatinine, Ser 4.09  0.50 - 1.35 (mg/dL)    Calcium 8.5  8.4 - 10.5 (mg/dL)    Total Protein 6.6  6.0 - 8.3 (g/dL)    Albumin 3.5  3.5 - 5.2 (g/dL)    AST 26  0 - 37 (U/L)    ALT 21  0 - 53 (U/L)    Alkaline Phosphatase 76  39 - 117 (U/L)    Total Bilirubin 0.3  0.3 - 1.2 (mg/dL)    GFR calc non Af Amer 89 (*) >90 (mL/min)    GFR calc Af Amer >90  >90 (mL/min)   TROPONIN I     Status: Normal   Collection Time   02/25/11  5:37 PM      Component Value Range Comment   Troponin I <0.30  <0.30 (ng/mL)   PROTIME-INR     Status: Normal   Collection Time   02/25/11  5:37 PM      Component Value Range Comment   Prothrombin Time 14.0  11.6 - 15.2 (seconds)    INR 1.06  0.00 - 1.49    APTT     Status: Normal   Collection Time   02/25/11  5:37 PM      Component Value Range Comment   aPTT 31  24 - 37 (seconds)   PHENYTOIN LEVEL, TOTAL     Status: Abnormal   Collection Time   02/25/11  6:33 PM      Component Value Range Comment   Phenytoin Lvl 6.6 (*)  10.0 - 20.0 (ug/mL)   INFLUENZA PANEL BY PCR     Status: Abnormal   Collection Time   02/26/11  3:11 AM      Component Value Range Comment   Influenza A By PCR POSITIVE (*) NEGATIVE     Influenza B By PCR NEGATIVE  NEGATIVE     H1N1 flu by pcr NOT DETECTED  NOT DETECTED    CBC     Status: Abnormal   Collection Time   02/26/11  6:30 AM      Component Value Range Comment   WBC 6.4  4.0 - 10.5 (K/uL)    RBC 3.71 (*) 4.22 - 5.81 (MIL/uL)    Hemoglobin 12.2 (*) 13.0 - 17.0 (g/dL)    HCT 81.1 (*) 91.4 - 52.0 (%)    MCV 96.5  78.0 - 100.0 (fL)    MCH 32.9  26.0 - 34.0 (pg)    MCHC 34.1  30.0 - 36.0 (g/dL)    RDW 78.2  95.6 - 21.3 (%)    Platelets 122 (*) 150 - 400 (K/uL)   BASIC METABOLIC PANEL     Status: Abnormal   Collection Time   02/26/11  6:30 AM      Component Value Range Comment   Sodium 135  135 - 145 (mEq/L)    Potassium 3.3 (*) 3.5 - 5.1 (mEq/L)    Chloride 104  96 - 112 (mEq/L)    CO2 24  19 - 32 (mEq/L)    Glucose, Bld 94  70 - 99 (mg/dL)    BUN 9  6 - 23 (mg/dL)    Creatinine, Ser 0.86  0.50 - 1.35 (mg/dL)    Calcium 8.2 (*) 8.4 - 10.5 (mg/dL)    GFR calc non Af Amer >90  >90 (mL/min)    GFR calc Af Amer >90  >90 (mL/min)     Micro Results: Recent Results (from the past 240 hour(s))  CULTURE, BLOOD (ROUTINE X 2)     Status: Normal (Preliminary result)   Collection Time   02/25/11  6:15 PM      Component Value Range Status Comment   Specimen Description BLOOD RIGHT HAND   Final    Special Requests NONE BOTTLES DRAWN AEROBIC AND ANAEROBIC 5CC   Final    Setup Time [id number]   Final    Culture     Final    Value:        BLOOD CULTURE RECEIVED NO GROWTH TO DATE CULTURE WILL BE HELD FOR 5 DAYS BEFORE ISSUING A FINAL NEGATIVE REPORT   Report Status PENDING   Incomplete   CULTURE, BLOOD (ROUTINE X 2)     Status: Normal (Preliminary result)   Collection Time   02/25/11  6:15 PM      Component Value Range Status Comment   Specimen Description BLOOD LEFT FOREARM    Final    Special Requests NONE BOTTLES DRAWN AEROBIC AND ANAEROBIC 5CC EACH   Final    Setup Time [id number]   Final    Culture     Final    Value:        BLOOD CULTURE RECEIVED NO GROWTH TO DATE CULTURE WILL BE HELD FOR 5 DAYS BEFORE ISSUING A FINAL NEGATIVE REPORT   Report Status PENDING   Incomplete     Studies/Results: Dg Chest 2 View  02/25/2011  *RADIOLOGY REPORT*  Clinical Data: Syncope.  CHEST - 2 VIEW  Comparison: None  Findings: The cardiac silhouette, mediastinal and hilar contours are within normal limits.  The lungs are clear.  Very small pleural effusions are suspected.  The bony thorax is intact.  IMPRESSION: Probable small pleural effusions.  No infiltrates, edema or pneumothorax.  Original Report Authenticated By: P. Loralie Champagne, M.D.   Ct Head Wo Contrast  02/25/2011  *RADIOLOGY REPORT*  Clinical Data: Fall last night  CT HEAD WITHOUT CONTRAST  Technique:  Contiguous axial images were obtained from the base of the skull through the vertex without contrast.  Comparison: 09/07/2010  Findings: There is diffuse patchy low density throughout the subcortical and periventricular white matter consistent with chronic small vessel ischemic change.  There is prominence of the sulci and ventricles consistent with brain atrophy.  On images number 14-12 there is abnormal, asymmetric hyperdensity overlying the sulci of the left frontal lobe consistent with subarachnoid hemorrhage.  Focal area of parenchymal hyperintensity with surrounding edema is noted measuring 4.3 mm.  This is consistent with a small parenchymal hematoma.  No significant mass effect or midline shift.  The paranasal sinuses are clear.  The mastoid air cells are clear. The skull appears intact.  IMPRESSION:  1.  Left frontal lobe of hyper densities are identified and suspicious for a parenchymal contusion and hemorrhage as well as a small amount of subarachnoid hemorrhage. 2.  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   Critical Value/emergent results were called by telephone at the time of interpretation on 02/25/11  at 4:00 p.m.  to  Beverely Low, MD, who verbally acknowledged these results.  Original Report Authenticated By: Rosealee Albee, M.D.   Medications: Scheduled Meds:   . albuterol  2.5 mg Nebulization Q4H  .  HYDROmorphone (DILAUDID) injection  1 mg Intravenous Once  . ipratropium  0.5 mg Nebulization Q4H  . niMODipine  30 mg Oral Q4H  . ondansetron (ZOFRAN) IV  4 mg Intravenous Once  . phenytoin (DILANTIN) IV  500 mg Intravenous Once  . phenytoin  200 mg Oral QPM  . phenytoin  300 mg Oral Q0700  . potassium chloride  10 mEq Oral BID  . DISCONTD: fosPHENYtoin (CEREBYX) IV  500 mg PE Intravenous Once   Continuous Infusions:    . sodium chloride 100 mL/hr at 02/28/11 0746   PRN Meds:.acetaminophen, albuterol, ipratropium, ondansetron (ZOFRAN) IV, ondansetron, oxymetazoline, DISCONTD: acetaminophen, DISCONTD: acetaminophen, DISCONTD: acetaminophen, DISCONTD:  morphine injection, DISCONTD: oxyCODONE  Assessment/Plan: #1 URTI(FLU+)-will continue tamiflu #2 Subarachnoid hemorrhage-I was informed by Dr Roselie Awkward that ED physician discussed this with the neuro-surgeon on call and he said that the [facility] was non surgical and that patient should be managed medically.Will continue nimodipine.However,with a recent complain of headache,will start dexamethasone and will reorder ct brain on 02/28/2011 and if the hemorrhage shows extension,will reconsult the neuro-surgeon #3 HYPERTENSION-Bp fairly stable. #4 SEIZURE DISORDER-will continue dilantin. #5 Hypokalemia-k repletion   LOS: 3 days   Quantay Zaremba 02/28/2011, 12:33 PM

## 2011-03-01 ENCOUNTER — Other Ambulatory Visit: Payer: Self-pay | Admitting: Internal Medicine

## 2011-03-01 NOTE — Progress Notes (Signed)
Utilization Review Completed.Joel [name] T12/18/2012   

## 2011-03-01 NOTE — Progress Notes (Signed)
Patient ID: Joel [name], male   DOB: 16-Jan-1946, 65 y.o.   MRN: [id number] Patient ID: Joel [name], male   DOB: [birth year], 65 y.o.   MRN: [id number] Patient ID: Joel [name], male   DOB: [birth year], 65 y.o.   MRN: [id number] Patient ID: Joel [name], male   DOB: 1945-10-11, 65 y.o.   MRN: [id number] Subjective: Patient seen.Feels remarkably improved.No headaches or neck stifness  Objective: Weight change:  No intake or output data in the 24 hours ending 03/01/11 1405 BP 141/75  Pulse 60  Temp(Src) 98.5 F (36.9 C) (Oral)  Resp 18  Ht 6\' 1"  (1.854 m)  Wt 93.895 kg (207 lb)  BMI 27.31 kg/m2  SpO2 99% Physical Exam: General appearance: alert, cooperative and no distress Head: Normocephalic, without obvious abnormality, atraumatic Neck: no adenopathy, no carotid bruit, no JVD, supple, symmetrical, trachea midline and thyroid not enlarged, symmetric, no tenderness/mass/nodules Lungs: clear to auscultation bilaterally Heart: regular rate and rhythm, S1, S2 normal, no murmur, click, rub or gallop Abdomen: soft, non-tender; bowel sounds normal; no masses,  no organomegaly Extremities: extremities normal, atraumatic, no cyanosis or edema Skin: Skin color, texture, turgor normal. No rashes or lesions Neuro-no nuchal rigidity.non focal  Lab Results: Results for orders placed during the hospital encounter of 02/25/11 (from the past 48 hour(s))  CBC     Status: Abnormal   Collection Time   02/25/11  5:37 PM      Component Value Range Comment   WBC 7.0  4.0 - 10.5 (K/uL)    RBC 3.83 (*) 4.22 - 5.81 (MIL/uL)    Hemoglobin 12.5 (*) 13.0 - 17.0 (g/dL)    HCT 13.2 (*) 44.0 - 52.0 (%)    MCV 95.3  78.0 - 100.0 (fL)    MCH 32.6  26.0 - 34.0 (pg)    MCHC 34.2  30.0 - 36.0 (g/dL)    RDW 10.2 (*) 72.5 - 15.5 (%)    Platelets 121 (*) 150 - 400 (K/uL)   DIFFERENTIAL     Status: Abnormal   Collection Time   02/25/11  5:37 PM      Component Value Range Comment   Neutrophils  Relative 69  43 - 77 (%)    Neutro Abs 4.8  1.7 - 7.7 (K/uL)    Lymphocytes Relative 14  12 - 46 (%)    Lymphs Abs 1.0  0.7 - 4.0 (K/uL)    Monocytes Relative 17 (*) 3 - 12 (%)    Monocytes Absolute 1.2 (*) 0.1 - 1.0 (K/uL)    Eosinophils Relative 0  0 - 5 (%)    Eosinophils Absolute 0.0  0.0 - 0.7 (K/uL)    Basophils Relative 0  0 - 1 (%)    Basophils Absolute 0.0  0.0 - 0.1 (K/uL)   CK TOTAL AND CKMB     Status: Normal   Collection Time   02/25/11  5:37 PM      Component Value Range Comment   Total CK 140  7 - 232 (U/L)    CK, MB 1.5  0.3 - 4.0 (ng/mL)    Relative Index 1.1  0.0 - 2.5    COMPREHENSIVE METABOLIC PANEL     Status: Abnormal   Collection Time   02/25/11  5:37 PM      Component Value Range Comment   Sodium 133 (*) 135 - 145 (mEq/L)    Potassium 3.4 (*) 3.5 - 5.1 (mEq/L)    Chloride 100  96 - 112 (mEq/L)    CO2 23  19 - 32 (mEq/L)    Glucose, Bld 105 (*) 70 - 99 (mg/dL)    BUN 9  6 - 23 (mg/dL)    Creatinine, Ser 1.61  0.50 - 1.35 (mg/dL)    Calcium 8.5  8.4 - 10.5 (mg/dL)    Total Protein 6.6  6.0 - 8.3 (g/dL)    Albumin 3.5  3.5 - 5.2 (g/dL)    AST 26  0 - 37 (U/L)    ALT 21  0 - 53 (U/L)    Alkaline Phosphatase 76  39 - 117 (U/L)    Total Bilirubin 0.3  0.3 - 1.2 (mg/dL)    GFR calc non Af Amer 89 (*) >90 (mL/min)    GFR calc Af Amer >90  >90 (mL/min)   TROPONIN I     Status: Normal   Collection Time   02/25/11  5:37 PM      Component Value Range Comment   Troponin I <0.30  <0.30 (ng/mL)   PROTIME-INR     Status: Normal   Collection Time   02/25/11  5:37 PM      Component Value Range Comment   Prothrombin Time 14.0  11.6 - 15.2 (seconds)    INR 1.06  0.00 - 1.49    APTT     Status: Normal   Collection Time   02/25/11  5:37 PM      Component Value Range Comment   aPTT 31  24 - 37 (seconds)   PHENYTOIN LEVEL, TOTAL     Status: Abnormal   Collection Time   02/25/11  6:33 PM      Component Value Range Comment   Phenytoin Lvl 6.6 (*) 10.0 - 20.0  (ug/mL)   INFLUENZA PANEL BY PCR     Status: Abnormal   Collection Time   02/26/11  3:11 AM      Component Value Range Comment   Influenza A By PCR POSITIVE (*) NEGATIVE     Influenza B By PCR NEGATIVE  NEGATIVE     H1N1 flu by pcr NOT DETECTED  NOT DETECTED    CBC     Status: Abnormal   Collection Time   02/26/11  6:30 AM      Component Value Range Comment   WBC 6.4  4.0 - 10.5 (K/uL)    RBC 3.71 (*) 4.22 - 5.81 (MIL/uL)    Hemoglobin 12.2 (*) 13.0 - 17.0 (g/dL)    HCT 09.6 (*) 04.5 - 52.0 (%)    MCV 96.5  78.0 - 100.0 (fL)    MCH 32.9  26.0 - 34.0 (pg)    MCHC 34.1  30.0 - 36.0 (g/dL)    RDW 40.9  81.1 - 91.4 (%)    Platelets 122 (*) 150 - 400 (K/uL)   BASIC METABOLIC PANEL     Status: Abnormal   Collection Time   02/26/11  6:30 AM      Component Value Range Comment   Sodium 135  135 - 145 (mEq/L)    Potassium 3.3 (*) 3.5 - 5.1 (mEq/L)    Chloride 104  96 - 112 (mEq/L)    CO2 24  19 - 32 (mEq/L)    Glucose, Bld 94  70 - 99 (mg/dL)    BUN 9  6 - 23 (mg/dL)    Creatinine, Ser 7.82  0.50 - 1.35 (mg/dL)    Calcium 8.2 (*) 8.4 - 10.5 (mg/dL)  GFR calc non Af Amer >90  >90 (mL/min)    GFR calc Af Amer >90  >90 (mL/min)     Micro Results: Recent Results (from the past 240 hour(s))  CULTURE, BLOOD (ROUTINE X 2)     Status: Normal (Preliminary result)   Collection Time   02/25/11  6:15 PM      Component Value Range Status Comment   Specimen Description BLOOD RIGHT HAND   Final    Special Requests NONE BOTTLES DRAWN AEROBIC AND ANAEROBIC 5CC   Final    Setup Time [id number]   Final    Culture     Final    Value:        BLOOD CULTURE RECEIVED NO GROWTH TO DATE CULTURE WILL BE HELD FOR 5 DAYS BEFORE ISSUING A FINAL NEGATIVE REPORT   Report Status PENDING   Incomplete   CULTURE, BLOOD (ROUTINE X 2)     Status: Normal (Preliminary result)   Collection Time   02/25/11  6:15 PM      Component Value Range Status Comment   Specimen Description BLOOD LEFT FOREARM   Final     Special Requests NONE BOTTLES DRAWN AEROBIC AND ANAEROBIC 5CC EACH   Final    Setup Time [id number]   Final    Culture     Final    Value:        BLOOD CULTURE RECEIVED NO GROWTH TO DATE CULTURE WILL BE HELD FOR 5 DAYS BEFORE ISSUING A FINAL NEGATIVE REPORT   Report Status PENDING   Incomplete     Studies/Results: Dg Chest 2 View  02/25/2011  *RADIOLOGY REPORT*  Clinical Data: Syncope.  CHEST - 2 VIEW  Comparison: None  Findings: The cardiac silhouette, mediastinal and hilar contours are within normal limits.  The lungs are clear.  Very small pleural effusions are suspected.  The bony thorax is intact.  IMPRESSION: Probable small pleural effusions.  No infiltrates, edema or pneumothorax.  Original Report Authenticated By: P. Loralie Champagne, M.D.   Ct Head Wo Contrast  02/25/2011  *RADIOLOGY REPORT*  Clinical Data: Fall last night  CT HEAD WITHOUT CONTRAST  Technique:  Contiguous axial images were obtained from the base of the skull through the vertex without contrast.  Comparison: 09/07/2010  Findings: There is diffuse patchy low density throughout the subcortical and periventricular white matter consistent with chronic small vessel ischemic change.  There is prominence of the sulci and ventricles consistent with brain atrophy.  On images number 14-12 there is abnormal, asymmetric hyperdensity overlying the sulci of the left frontal lobe consistent with subarachnoid hemorrhage.  Focal area of parenchymal hyperintensity with surrounding edema is noted measuring 4.3 mm.  This is consistent with a small parenchymal hematoma.  No significant mass effect or midline shift.  The paranasal sinuses are clear.  The mastoid air cells are clear. The skull appears intact.  IMPRESSION:  1.  Left frontal lobe of hyper densities are identified and suspicious for a parenchymal contusion and hemorrhage as well as a small amount of subarachnoid hemorrhage. 2.  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  Critical  Value/emergent results were called by telephone at the time of interpretation on 02/25/11  at 4:00 p.m.  to  Beverely Low, MD, who verbally acknowledged these results.  Original Report Authenticated By: Rosealee Albee, M.D.   Medications: Scheduled Meds:   . albuterol  2.5 mg Nebulization Q4H  .  HYDROmorphone (DILAUDID) injection  1 mg Intravenous Once  .  ipratropium  0.5 mg Nebulization Q4H  . niMODipine  30 mg Oral Q4H  . ondansetron (ZOFRAN) IV  4 mg Intravenous Once  . phenytoin (DILANTIN) IV  500 mg Intravenous Once  . phenytoin  200 mg Oral QPM  . phenytoin  300 mg Oral Q0700  . potassium chloride  10 mEq Oral BID  . DISCONTD: fosPHENYtoin (CEREBYX) IV  500 mg PE Intravenous Once   Continuous Infusions:    . sodium chloride 100 mL/hr at 03/01/11 0322   PRN Meds:.acetaminophen, albuterol, ipratropium, ondansetron (ZOFRAN) IV, ondansetron, oxymetazoline  Assessment/Plan: #1 URTI(FLU+)-will continue tamiflu for a total of 10days #2 Subarachnoid hemorrhage-I was informed by Dr Roselie Awkward that ED physician discussed this with the neuro-surgeon on call and he said that the [facility] was non surgical and that patient should be managed medically.Will continue nimodipine.Patient was started on dexamethasone yesterday b/c headaches in the setting of SAH.He is scheduled to have a repeat ct of the brain done on 03/02/2011 and if it shows bleeding extension,please re-consult neuro-surgeon. #3 HYPERTENSION-Bp fairly stable. #4 SEIZURE DISORDER-will continue dilantin.Repeat level in am #5 Hypokalemia-k repletion. ISSUES: If repeat ct of the brain does not show bleeding extension and patient does not have any headaches,pls consider d/c home on the following: a-complete 10day course of tamiflu b-continue nimodipine c-complete 10day course of dexamethasone.   LOS: 4 days   Jaqulyn Chancellor 03/01/2011, 2:05 PM

## 2011-03-02 ENCOUNTER — Inpatient Hospital Stay (HOSPITAL_COMMUNITY): Payer: BC Managed Care – PPO

## 2011-03-02 ENCOUNTER — Encounter (HOSPITAL_COMMUNITY): Payer: Self-pay | Admitting: Neurological Surgery

## 2011-03-02 DIAGNOSIS — J101 Influenza due to other identified influenza virus with other respiratory manifestations: Secondary | ICD-10-CM | POA: Diagnosis present

## 2011-03-02 LAB — COMPREHENSIVE METABOLIC PANEL
BUN: 12 mg/dL (ref 6–23)
CO2: 24 mEq/L (ref 19–32)
Calcium: 8.9 mg/dL (ref 8.4–10.5)
Creatinine, Ser: 0.82 mg/dL (ref 0.50–1.35)
GFR calc Af Amer: >90 mL/min (ref >90)
GFR calc non Af Amer: >90 mL/min (ref >90)
Glucose, Bld: 102 mg/dL — ABNORMAL HIGH (ref 70–99)

## 2011-03-02 LAB — PHENYTOIN LEVEL, TOTAL: Phenytoin Lvl: 8 ug/mL — ABNORMAL LOW (ref 10.0–20.0)

## 2011-03-02 LAB — CBC
Hemoglobin: 12.6 g/dL — ABNORMAL LOW (ref 13.0–17.0)
MCH: 32.4 pg (ref 26.0–34.0)
MCV: 94.1 fL (ref 78.0–100.0)
RBC: 3.89 MIL/uL — ABNORMAL LOW (ref 4.22–5.81)

## 2011-03-02 MED ORDER — DEXAMETHASONE 4 MG PO TABS: 4.0000 mg | ORAL_TABLET | Freq: Two times a day (BID) | ORAL | Status: AC

## 2011-03-02 MED ORDER — PANTOPRAZOLE SODIUM 20 MG PO TBEC: 20.0000 mg | DELAYED_RELEASE_TABLET | Freq: Every day | ORAL | Status: DC

## 2011-03-02 MED ORDER — OSELTAMIVIR PHOSPHATE 75 MG PO CAPS: 75.0000 mg | ORAL_CAPSULE | Freq: Two times a day (BID) | ORAL | Status: DC

## 2011-03-02 NOTE — Progress Notes (Signed)
   CARE MANAGEMENT NOTE 03/02/2011  Patient:  Joel [name], Joel [name]   Account Number:  [id number]  Date Initiated:  03/02/2011  Documentation initiated by:  Onnie Boer  Subjective/Objective Assessment:   PT WAS ADMITTED WITH SM CONTUSION     Action/Plan:   PROGRESSION OF CARE AND DISCHARGE PLANNING   Anticipated DC Date:  03/04/2011   Anticipated DC Plan:  HOME/SELF CARE      DC Planning Services  CM consult      Choice offered to / List presented to:             Status of service:  In process, will continue to follow Medicare Important Message given?   (If response is "NO", the following Medicare IM given date fields will be blank) Date Medicare IM given:   Date Additional Medicare IM given:    Discharge Disposition:    Per UR Regulation:  Reviewed for med. necessity/level of care/duration of stay  Comments:  03/01/2011 Onnie Boer, RN, BSN 1517 PT WAS ADMITTED WITH SYNCOPE AND FALL AND SM CEREBRAL CONTUSION AND SAH.  PT IS HAVING A CT SCAN TODAY WILL F/U ON DC NEEDS.

## 2011-03-02 NOTE — Progress Notes (Signed)
D/C information discussed. New, changed and current medicines discussed. Pt has no questions at this time. Awaiting Pt daughter to arrive.  Joel [name] 03/02/2011 8:30 PM

## 2011-03-02 NOTE — Discharge Summary (Addendum)
Patient ID: Joel [name] MRN: [id number] DOB/AGE: 08/23/1945 65 y.o.  Admit date: 02/25/2011 Discharge date: 03/02/2011  Primary Care Physician:  Willow Ora, MD, MD  Discharge Diagnoses:    Present on Admission:  .Subarachnoid hemorrhage .Influenza A .HYPERTENSION .SEIZURE DISORDER .Syncope     Current Discharge Medication List    START taking these medications   Details  dexamethasone (DECADRON) 4 MG tablet Take 1 tablet (4 mg total) by mouth 2 (two) times daily with a meal. Qty: 32 tablet, Refills: 0    oseltamivir (TAMIFLU) 75 MG capsule Take 1 capsule (75 mg total) by mouth 2 (two) times daily. Qty: 2 capsule, Refills: 0    pantoprazole (PROTONIX) 20 MG tablet Take 1 tablet (20 mg total) by mouth daily. Qty: 14 tablet, Refills: 0      CONTINUE these medications which have NOT CHANGED   Details  AZOR 10-40 MG per tablet take 1 tablet by mouth once daily Qty: 30 tablet, Refills: 6    calcium carbonate (CALCIUM 500) 1250 MG tablet Take 1 tablet by mouth daily.      Chlorpheniramine-APAP (CORICIDIN) 2-325 MG TABS Take 2 tablets by mouth every 6 (six) hours as needed. For congestion     multivitamin (THERAGRAN) per tablet Take 1 tablet by mouth daily.      Omega-3 Fatty Acids (FISH OIL) 1200 MG CAPS Take 1,200 mg by mouth 2 (two) times daily.     phenytoin (DILANTIN) 100 MG ER capsule Take 200 mg by mouth 2 (two) times daily.     amoxicillin (AMOXIL) 500 MG capsule Take 1 capsule (500 mg total) by mouth 2 (two) times daily. Qty: 20 capsule, Refills: 0    furosemide (LASIX) 40 MG tablet take 1/2 tablet by mouth twice a day Qty: 60 tablet, Refills: 6      STOP taking these medications     aspirin 81 MG tablet         Disposition and Follow-up:  Follow up with Dr Marikay Alar in 1 week.  Follow up in ED or call PCP if symptoms of confusion, persistent headache or dizziness occours  Consults:   Marikay Alar ( neurosurgery)  Significant Diagnostic  Studies:  Dg Chest 2 View  02/25/2011  *RADIOLOGY REPORT*  Clinical Data: Syncope.  CHEST - 2 VIEW  Comparison: None  Findings: The cardiac silhouette, mediastinal and hilar contours are within normal limits.  The lungs are clear.  Very small pleural effusions are suspected.  The bony thorax is intact.  IMPRESSION: Probable small pleural effusions.  No infiltrates, edema or pneumothorax.  Original Report Authenticated By: P. Loralie Champagne, M.D.   Ct Head Wo Contrast  02/26/2011  *RADIOLOGY REPORT*  Clinical Data: Larey Seat on 12/13, hit back of head with loss of consciousness, CT findings compatible with intracranial and subarachnoid hemorrhage.  CT HEAD WITHOUT CONTRAST  Technique:  Contiguous axial images were obtained from the base of the skull through the vertex without contrast.  Comparison: Head CT - 02/25/2011; brain MRI - 03/09/2011  Findings:  Interval development of an additional area of intraparenchymal hemorrhage within a previously identified subtle area of hypoattenuation within the inferior aspect of the left frontal lobe (image 8).  The area of new hemorrhage measures approximately 1.0 x 1.4 cm.  Previously identified punctate, approximately 1.3 x 1.4 cm intraparenchymal hemorrhage within the left frontal lobe is grossly unchanged compared to the prior examination.  The minimal amount of adjacent subarachnoid hemorrhage (image 97 series 2) which is  also unchanged, however there is been interval development of subarachnoid hemorrhage within the several parietal occipital sulci bilaterally.  Tiny crescentic shaped subdural hematoma along the left parietal convexity measuring approximately 2 mm in diameter is grossly unchanged (image 15). Unchanged size and configuration of the ventricles and basilar cisterns.  No midline shift.  No calvarial fracture.  Paranasal sinuses are normal.  Regional soft tissues are normal.  IMPRESSION: 1.  Interval development of an additional area of intraparenchymal  hemorrhage within the inferior aspect of the left frontal lobe. The previously identified punctate area of intraparenchymal hemorrhage within the more superior left frontal lobe is grossly unchanged.  2.  Interval development of subarachnoid hemorrhage within several bilateral parieto-occipital sulci. 3.  Unchanged small subdural hematoma about the left parietal convexity.  No midline shift.  Findings discussed with Joselyn Glassman, RN at [phone].  Original Report Authenticated By: Waynard Reeds, M.D.    Brief H and P: For complete details please refer to admission H and P, but in brief  [name] with h/o seizure disorder, HTN, and right sided sensorineural hearing  loss presents with cough, congestion, syncope and fall, and found to have fever,  small cerebral parenchymal contusion, hemorrhage, and subarachnoid  hemorrhage  Pt states that Wednesday he developed dry cough, chest congestion, nausea  and vomiting x1, decreased appetite. Went to bed that night and had sweats  but no documented fever. Around 230a, he woke up and went to the bathroom,  and was sitting down to pee, without any straining to have a BM or urinate,  and next thing he knows he wakes up on the floor of the bathroom with a bump  on his head. He remembers where he was, knew exactly what had happened, but  difficult to say how long he was down. He went back to bed, noted blood on  pillow. He denies any symptoms beforehand, no dizziness, CP, SOB,  diaphoresis, clamminess, other vagal symtpoms.  Went to see PCP today, who did ear exam and felt left TM was dull,  erythematous, with fluid noted, and diagnosed otitis media for which he was  Rx'd Amoxicillin. Syncope was thought due to this OM and low blood sugar,  but given bruise and TTP on back of head, pt was sent to CT scan of head  which showed small vessel changes, but also with left frontal lobe  hyperdensities concerning for parenchymal contustion and hemorrhage (4.3 mm  with  surrounding edema), and small amt of subarachnoid hemorrhage. Pt called  back and sent to [facility].      Physical Exam on Discharge:  Filed Vitals:   03/01/11 1521 03/01/11 2157 03/02/11 0600 03/02/11 1420  BP: 139/81 147/86 143/82 135/73  Pulse: 60 58 61 62  Temp: 98.9 F (37.2 C) 98.4 F (36.9 C) 98.6 F (37 C) 98.3 F (36.8 C)  TempSrc: Oral Oral  Oral  Resp: 18 21 16 18   Height:      Weight:      SpO2: 98% 97% 100% 97%     Intake/Output Summary (Last 24 hours) at 03/02/11 1900 Last data filed at 03/02/11 1700  Gross per 24 hour  Intake   1000 ml  Output      0 ml  Net   1000 ml    General: Alert, awake, oriented x3, in no acute distress. HEENT: no pallor , no icterus Heart: Regular rate and rhythm, without murmurs, rubs, gallops. Lungs: Clear to auscultation bilaterally. Abdomen: Soft, nontender, nondistended,  positive bowel sounds. Extremities: No clubbing cyanosis or edema with positive pedal pulses. Neuro: Grossly intact, nonfocal.  CBC:    Component Value Date/Time   WBC 6.8 03/02/2011 0610   HGB 12.6* 03/02/2011 0610   HCT 36.6* 03/02/2011 0610   PLT 160 03/02/2011 0610   MCV 94.1 03/02/2011 0610   NEUTROABS 4.8 02/25/2011 1737   LYMPHSABS 1.0 02/25/2011 1737   MONOABS 1.2* 02/25/2011 1737   EOSABS 0.0 02/25/2011 1737   BASOSABS 0.0 02/25/2011 1737    Basic Metabolic Panel:    Component Value Date/Time   NA 138 03/02/2011 0610   K 4.0 03/02/2011 0610   CL 106 03/02/2011 0610   CO2 24 03/02/2011 0610   BUN 12 03/02/2011 0610   CREATININE 0.82 03/02/2011 0610   GLUCOSE 102* 03/02/2011 0610   CALCIUM 8.9 03/02/2011 0610    Hospital Course:   Subarachnoid hemorrhage following syncope - CT head showed   Interval development of an additional area of intraparenchymal hemorrhage within the inferior aspect of the left frontal lobe With  Interval development of subarachnoid hemorrhage within several bilateral parieto-occipital sulci.also a small  subdural hematoma about the left parietal convexity.ED physician discussed this with the neuro-surgeon on call and he said that the [facility] was non surgical and that patient should be managed medically.patient monitored on tele with neuro checks. Patient was started on dexamethasone  With c/o headaches.   repeat ct of the brain done on 03/02/2011 shows improvement on [facility] but showed increase in size of subdural hematoma. Neurosurgery consult was called. He was seen by Dr Yetta Barre who recommended the CT finding of small left-sided extra-axial fluid collection not really consistent with subdural hematoma, but more consistent with hygroma.  He willl follow up with patient  in one week with a repeat head CT, or sooner should he have any neurologic or mental status changes.  Patient' symptoms of syncope was likely due to dehydration and is now stable wit IV fluids He was continued on dilantin with therapeutic levels  Influenza A Patient had symptoms of URI on presentation. Rapid flu pos for influenza A. patient being treated with tamiflu for 5 day course.his symptoms now resolved  Patient clinically stable and will be discharged home on 1 more day of tamiflu ( total 5 days). Cont home meds except ASA for now. He will be discharged on a tapering dose of decadron ad should follow up with Dr Yetta Barre in 1 week or sooner if needed.  Time spent on Discharge: 45 minutes  Signed: Joan Avetisyan 03/02/2011, 7:00 PM

## 2011-03-02 NOTE — Consult Note (Signed)
Reason for Consult:[facility] Referring Physician: Hospitalist  Joel [name] is an 65 y.o. male.   HPI:  Is a 65 year old gentleman was admitted after a fall. Sounds like he fell about a week ago in the middle the night and struck his head on the toilet. I believe there was loss of consciousness. He went to see his primary care physician ordered a CT scan of the head, this showed a small contusion he was sent to the emergency department for evaluation. He was admitted for a syncopal workup. He's had 2 different followup CTs since admission. He had a left frontal headache when he coughs. No visual changes. No numbness tingling or weakness. No change in gait. Has a history of a seizure disorder but has not had a seizure to 15 years and remains on Dilantin. After Love was his neurologist.   Past Medical History  Diagnosis Date  . Seizure disorder     Dx remotely, last SZ activity aprox 1998  . Back pain     HNP dx ~2008, no surgery  . HTN (hypertension)   . Hearing loss     Rt ear sensorineural hearing loss, with hearing aide. W/u by Dr. Molli Barrows extensively negative    Past Surgical History  Procedure Date  . Hernia repair     as an infant    No Known Allergies  History  Substance Use Topics  . Smoking status: Never Smoker   . Smokeless tobacco: Never Used  . Alcohol Use: Yes     occasionally    Family History  Problem Relation Age of Onset  . Emphysema Mother     smoker  . Hypertension Father     F and B   . Leukemia Sister   . Diabetes Neg Hx   . Prostate cancer Brother 70  . Colon cancer Neg Hx   . Coronary artery disease Neg Hx      Review of Systems  Positive ROS: neg  All other systems have been reviewed and were otherwise negative with the exception of those mentioned in the HPI and as above.  Objective: Vital signs in last 24 hours: Temp:  [98.3 F (36.8 C)-98.6 F (37 C)] 98.3 F (36.8 C) (12/19 1420) Pulse Rate:  [58-62] 62  (12/19 1420) Resp:   [16-21] 18  (12/19 1420) BP: (135-147)/(73-86) 135/73 mmHg (12/19 1420) SpO2:  [97 %-100 %] 97 % (12/19 1420)  General Appearance: Alert, cooperative, no distress, appears stated age Head: Normocephalic, without obvious abnormality, atraumatic Eyes: PERRL, conjunctiva/corneas clear, EOM's intact, fundi benign, both eyes      Ears: Normal TM's and external ear canals, both ears Throat: benign Neck: Supple, symmetrical, trachea midline, no adenopathy; thyroid: No enlargement/tenderness/nodules; no carotid bruit or JVD Back: Symmetric, no curvature, ROM normal, no CVA tenderness Lungs: Clear to auscultation bilaterally, respirations unlabored Heart: Regular rate and rhythm, S1 and S2 normal, no murmur, rub or gallop Abdomen: Soft, non-tender, bowel sounds active all four quadrants, no masses, no organomegaly Extremities: Extremities normal, atraumatic, no cyanosis or edema Pulses: 2+ and symmetric all extremities Skin: Skin color, texture, turgor normal, no rashes or lesions  NEUROLOGIC:   Mental status: A&O x4, no aphasia, good attention span, Memory and fund of knowledge Motor Exam - grossly normal, normal tone and bulk Sensory Exam - grossly normal Reflexes: symmetric, no pathologic reflexes, No Hoffman's, No clonus Coordination - grossly normal Gait - grossly normal Balance - grossly normal Cranial Nerves: I: smell Not tested  II:  visual acuity  OS: nl    OD: nl  II: visual fields Full to confrontation  II: pupils Equal, round, reactive to light  III,VII: ptosis None  III,IV,VI: extraocular muscles  Full ROM  V: mastication Normal  V: facial light touch sensation  Normal  V,VII: corneal reflex  Present  VII: facial muscle function - upper  Normal  VII: facial muscle function - lower Normal  VIII: hearing Not tested  IX: soft palate elevation  Normal  IX,X: gag reflex Present  XI: trapezius strength  5/5  XI: sternocleidomastoid strength 5/5  XI: neck flexion strength  5/5   XII: tongue strength  Normal    Data Review Lab Results  Component Value Date   WBC 6.8 03/02/2011   HGB 12.6* 03/02/2011   HCT 36.6* 03/02/2011   MCV 94.1 03/02/2011   PLT 160 03/02/2011   Lab Results  Component Value Date   NA 138 03/02/2011   K 4.0 03/02/2011   CL 106 03/02/2011   CO2 24 03/02/2011   BUN 12 03/02/2011   CREATININE 0.82 03/02/2011   GLUCOSE 102* 03/02/2011   Lab Results  Component Value Date   INR 1.06 02/25/2011    Radiology: Dg Chest 2 View  02/25/2011  *RADIOLOGY REPORT*  Clinical Data: Syncope.  CHEST - 2 VIEW  Comparison: None  Findings: The cardiac silhouette, mediastinal and hilar contours are within normal limits.  The lungs are clear.  Very small pleural effusions are suspected.  The bony thorax is intact.  IMPRESSION: Probable small pleural effusions.  No infiltrates, edema or pneumothorax.  Original Report Authenticated By: P. Loralie Champagne, M.D.   Ct Head Wo Contrast  02/26/2011  *RADIOLOGY REPORT*  Clinical Data: Larey Seat on 12/13, hit back of head with loss of consciousness, CT findings compatible with intracranial and subarachnoid hemorrhage.  CT HEAD WITHOUT CONTRAST  Technique:  Contiguous axial images were obtained from the base of the skull through the vertex without contrast.  Comparison: Head CT - 02/25/2011; brain MRI - 03/09/2011  Findings:  Interval development of an additional area of intraparenchymal hemorrhage within a previously identified subtle area of hypoattenuation within the inferior aspect of the left frontal lobe (image 8).  The area of new hemorrhage measures approximately 1.0 x 1.4 cm.  Previously identified punctate, approximately 1.3 x 1.4 cm intraparenchymal hemorrhage within the left frontal lobe is grossly unchanged compared to the prior examination.  The minimal amount of adjacent subarachnoid hemorrhage (image 97 series 2) which is also unchanged, however there is been interval development of subarachnoid hemorrhage  within the several parietal occipital sulci bilaterally.  Tiny crescentic shaped subdural hematoma along the left parietal convexity measuring approximately 2 mm in diameter is grossly unchanged (image 15). Unchanged size and configuration of the ventricles and basilar cisterns.  No midline shift.  No calvarial fracture.  Paranasal sinuses are normal.  Regional soft tissues are normal.  IMPRESSION: 1.  Interval development of an additional area of intraparenchymal hemorrhage within the inferior aspect of the left frontal lobe. The previously identified punctate area of intraparenchymal hemorrhage within the more superior left frontal lobe is grossly unchanged.  2.  Interval development of subarachnoid hemorrhage within several bilateral parieto-occipital sulci. 3.  Unchanged small subdural hematoma about the left parietal convexity.  No midline shift.  Findings discussed with Joselyn Glassman, RN at [phone].  Original Report Authenticated By: Waynard Reeds, M.D.   CT scan: Small resolving left frontal contusions without mass effect or shift,  slightly enlarged extra-axial fluid space on the left, which is isodense.  Assessment/Plan: Mild closed head injury after syncopal event. Doubt seizure. Neurologic exam is normal. Head CT shows small left-sided extra-axial fluid collection not really consistent with subdural hematoma, but more consistent with hygroma. This is likely very benign. I will follow up with him in one week with a repeat head CT, or sooner should he have any neurologic or mental status changes. I have explained to him and his family what to look out for. I think he is safe for discharge, and I have explained postconcussive care and activity modifications.   Joel [name] S 03/02/2011 4:31 PM

## 2011-03-03 LAB — CULTURE, BLOOD (ROUTINE X 2): Culture  Setup Time: 201212142247

## 2011-03-04 ENCOUNTER — Ambulatory Visit (INDEPENDENT_AMBULATORY_CARE_PROVIDER_SITE_OTHER): Payer: BC Managed Care – PPO | Admitting: Internal Medicine

## 2011-03-04 ENCOUNTER — Encounter: Payer: Self-pay | Admitting: Internal Medicine

## 2011-03-04 ENCOUNTER — Telehealth: Payer: Self-pay | Admitting: *Deleted

## 2011-03-04 ENCOUNTER — Ambulatory Visit: Payer: BC Managed Care – PPO | Admitting: Family Medicine

## 2011-03-04 VITALS — BP 160/86 | HR 64 | Temp 98.5°F | Ht 73.5 in | Wt 208.0 lb

## 2011-03-04 DIAGNOSIS — M549 Dorsalgia, unspecified: Secondary | ICD-10-CM

## 2011-03-04 DIAGNOSIS — R55 Syncope and collapse: Secondary | ICD-10-CM

## 2011-03-04 DIAGNOSIS — J111 Influenza due to unidentified influenza virus with other respiratory manifestations: Secondary | ICD-10-CM

## 2011-03-04 MED ORDER — HYDROCODONE-ACETAMINOPHEN 7.5-300 MG PO TABS: 1.0000 | ORAL_TABLET | Freq: Four times a day (QID) | ORAL | Status: DC | PRN

## 2011-03-04 MED ORDER — HYDROCODONE-ACETAMINOPHEN 7.5-325 MG PO TABS: 1.0000 | ORAL_TABLET | Freq: Four times a day (QID) | ORAL | Status: AC | PRN

## 2011-03-04 MED ORDER — CYCLOBENZAPRINE HCL 10 MG PO TABS: 10.0000 mg | ORAL_TABLET | Freq: Every evening | ORAL | Status: AC | PRN

## 2011-03-04 NOTE — Telephone Encounter (Signed)
Yes

## 2011-03-04 NOTE — Progress Notes (Signed)
  Subjective:    Patient ID: Joel [name], male    DOB: 1945/10/23, 65 y.o.   MRN: [id number]  HPI Hospital followup. He was seen 02-25-11 with respiratory symptoms and also syncope in the setting of not feeling well, nausea, vomiting. A CT scan showed possible SAH. The patient was admitted to the hospital, he was also found to have influenza. Neurosurgery was consulted, they questioned if CT represented SAH versus hygroma, eventually he was cleared to go home. He was treated for Tamiflu for influenza and released home all his regular medications. Today he is here because of back pain, this actually started while he was in the hospital and he thinks is related to the position he slept while inpatient (head elevation). The pain is in the lower back, no radiation, denies bladder or bowel incontinence.  Past Medical History  Diagnosis Date  . Seizure disorder     Dx remotely, last SZ activity aprox 1998  . Back pain     HNP dx ~2008, no surgery  . HTN (hypertension)   . Hearing loss     Rt ear sensorineural hearing loss, with hearing aide. W/u by Dr. Molli Barrows extensively negative   Past Surgical History  Procedure Date  . Hernia repair     as an infant     Review of Systems As far as they influenza he is a lot better, no fever or chills, no cough. No sinus discharge. No nausea, vomiting, diarrhea. He also denies dizziness, headache or neck pain. Appetite is better. Denies any further syncopal spells. As far as the forehead he developed after he fell it is improving      Objective:   Physical Exam  Constitutional: He is oriented to person, place, and time. He appears well-developed and well-nourished. No distress.  HENT:  Head: Normocephalic and atraumatic.  Nose: Nose normal.  Cardiovascular: Normal rate, regular rhythm and normal heart sounds.   No murmur heard. Pulmonary/Chest: Effort normal and breath sounds normal. No respiratory distress. He has no wheezes. He  has no rales.  Musculoskeletal: He exhibits no edema.       Not tender to palpation at the cervical thoracic or lumbar spine  Neurological: He is alert and oriented to person, place, and time.       Speech is fluent, face symmetric, cranial nerves intact, muscle strength and DTR symmetric as well. Gait normal. Range of motion of the neck is normal, the back pain is somehow exacerbated w/ neck extension   Skin: He is not diaphoretic.  Psychiatric: He has a normal mood and affect. His behavior is normal. Judgment and thought content normal.      Assessment & Plan:  F2F> 30 min due to chart review   1. Influenza--resolving 2. Abnormal CT of the head--- SAH versus hygroma, recommend followup with Dr. Yetta Barre. Neurological exam today normal. 3. Syncope--symptoms happened in the setting of an acute illness, not likely a seizure or a cardiovascular event. 4. Back pain--likely a musculoskeletal issue. Will treat in a standard fashion, see instructions. Warned about GI side effects from motrin, drowsiness from Flexeril. Also recommend to avoid excessive Tylenol

## 2011-03-04 NOTE — Patient Instructions (Signed)
Rest Warm compress Motrin OTC 1 or 2 with meals as needed If the pain continue try tylenol or vicodin as needed, do not mix them , both have tylenol  Flexeril at bedtime Call if no better in 10 days See Dr Yetta Barre

## 2011-03-04 NOTE — Telephone Encounter (Signed)
Hydrocodone not avaiable in 7.5-300mg . Is it ok to change to 7.5-325.Please advise pt in store.

## 2011-03-04 NOTE — Telephone Encounter (Signed)
Rx sent 

## 2011-03-15 DIAGNOSIS — S065X9A Traumatic subdural hemorrhage with loss of consciousness of unspecified duration, initial encounter: Secondary | ICD-10-CM

## 2011-03-15 DIAGNOSIS — S065XAA Traumatic subdural hemorrhage with loss of consciousness status unknown, initial encounter: Secondary | ICD-10-CM

## 2011-03-15 HISTORY — DX: Traumatic subdural hemorrhage with loss of consciousness of unspecified duration, initial encounter: S06.5X9A

## 2011-03-15 HISTORY — DX: Traumatic subdural hemorrhage with loss of consciousness status unknown, initial encounter: S06.5XAA

## 2011-05-02 ENCOUNTER — Other Ambulatory Visit: Payer: Self-pay | Admitting: Internal Medicine

## 2011-05-02 NOTE — Telephone Encounter (Signed)
Refill done.  

## 2011-05-13 DIAGNOSIS — W19XXXA Unspecified fall, initial encounter: Secondary | ICD-10-CM

## 2011-05-13 HISTORY — DX: Unspecified fall, initial encounter: W19.XXXA

## 2011-05-27 ENCOUNTER — Ambulatory Visit (INDEPENDENT_AMBULATORY_CARE_PROVIDER_SITE_OTHER): Payer: BC Managed Care – PPO | Admitting: Internal Medicine

## 2011-05-27 ENCOUNTER — Encounter (HOSPITAL_COMMUNITY): Payer: Self-pay | Admitting: Emergency Medicine

## 2011-05-27 ENCOUNTER — Encounter (HOSPITAL_COMMUNITY): Payer: Self-pay | Admitting: Anesthesiology

## 2011-05-27 ENCOUNTER — Encounter (HOSPITAL_COMMUNITY): Admission: EM | Disposition: A | Discharge status: Home / Self Care | Payer: Self-pay | Source: Ambulatory Visit

## 2011-05-27 ENCOUNTER — Ambulatory Visit
Admission: RE | Admit: 2011-05-27 | Discharge: 2011-05-27 | Disposition: A | Discharge status: Home / Self Care | Payer: BC Managed Care – PPO | Source: Ambulatory Visit | Attending: Internal Medicine | Admitting: Internal Medicine

## 2011-05-27 ENCOUNTER — Other Ambulatory Visit: Payer: Self-pay

## 2011-05-27 ENCOUNTER — Inpatient Hospital Stay (HOSPITAL_COMMUNITY)
Admission: EM | Admit: 2011-05-27 | Discharge: 2011-06-02 | DRG: 002 | Disposition: A | Discharge status: Home / Self Care | Payer: BC Managed Care – PPO | Source: Ambulatory Visit | Attending: Neurosurgery | Admitting: Neurosurgery

## 2011-05-27 ENCOUNTER — Emergency Department (HOSPITAL_COMMUNITY): Payer: BC Managed Care – PPO | Admitting: Anesthesiology

## 2011-05-27 ENCOUNTER — Encounter: Payer: Self-pay | Admitting: Internal Medicine

## 2011-05-27 VITALS — BP 134/82 | HR 64 | Temp 98.6°F | Wt 202.0 lb

## 2011-05-27 DIAGNOSIS — W19XXXA Unspecified fall, initial encounter: Secondary | ICD-10-CM | POA: Diagnosis present

## 2011-05-27 DIAGNOSIS — Y998 Other external cause status: Secondary | ICD-10-CM

## 2011-05-27 DIAGNOSIS — S065X9A Traumatic subdural hemorrhage with loss of consciousness of unspecified duration, initial encounter: Secondary | ICD-10-CM

## 2011-05-27 DIAGNOSIS — F0781 Postconcussional syndrome: Secondary | ICD-10-CM

## 2011-05-27 DIAGNOSIS — Z9181 History of falling: Secondary | ICD-10-CM

## 2011-05-27 DIAGNOSIS — R569 Unspecified convulsions: Secondary | ICD-10-CM

## 2011-05-27 DIAGNOSIS — I6200 Nontraumatic subdural hemorrhage, unspecified: Secondary | ICD-10-CM

## 2011-05-27 DIAGNOSIS — S065X0A Traumatic subdural hemorrhage without loss of consciousness, initial encounter: Principal | ICD-10-CM | POA: Diagnosis present

## 2011-05-27 HISTORY — PX: CRANIOTOMY: SHX93

## 2011-05-27 LAB — BASIC METABOLIC PANEL
Calcium: 9.2 mg/dL (ref 8.4–10.5)
Creatinine, Ser: 0.83 mg/dL (ref 0.50–1.35)
GFR calc Af Amer: >90 mL/min (ref >90)
GFR calc non Af Amer: >90 mL/min (ref >90)
GFR: 107.8 mL/min (ref >60.00)
Glucose, Bld: 105 mg/dL — ABNORMAL HIGH (ref 70–99)
Potassium: 3.7 mEq/L (ref 3.5–5.1)
Sodium: 138 mEq/L (ref 135–145)

## 2011-05-27 LAB — DIFFERENTIAL
Basophils Absolute: 0 10*3/uL (ref 0.0–0.1)
Basophils Relative: 0 % (ref 0–1)
Lymphocytes Relative: 19 % (ref 12–46)
Neutro Abs: 4.9 10*3/uL (ref 1.7–7.7)

## 2011-05-27 LAB — CBC
Platelets: 162 10*3/uL (ref 150–400)
RDW: 12.6 % (ref 11.5–15.5)
WBC: 7.1 10*3/uL (ref 4.0–10.5)

## 2011-05-27 LAB — SEDIMENTATION RATE: Sed Rate: 5 mm/hr (ref 0–22)

## 2011-05-27 LAB — TYPE AND SCREEN

## 2011-05-27 LAB — ABO/RH: ABO/RH(D): A POS

## 2011-05-27 SURGERY — CRANIOTOMY HEMATOMA EVACUATION SUBDURAL
Anesthesia: General | Site: Head | Laterality: Right | Wound class: Clean

## 2011-05-27 MED ORDER — ACETAMINOPHEN 325 MG PO TABS: 650.0000 mg | ORAL_TABLET | ORAL | Status: DC | PRN

## 2011-05-27 MED ORDER — SODIUM CHLORIDE 0.9 % IV SOLN
200.0000 mg | INTRAVENOUS | Status: AC
  Administered 2011-05-28: 200 mg via INTRAVENOUS
  Filled 2011-05-27: qty 4

## 2011-05-27 MED ORDER — LIDOCAINE-EPINEPHRINE 1 %-1:100000 IJ SOLN
INTRAMUSCULAR | Status: DC | PRN
  Administered 2011-05-27: 15 mL

## 2011-05-27 MED ORDER — LABETALOL HCL 5 MG/ML IV SOLN: 10.0000 mg | INTRAVENOUS | Status: DC | PRN

## 2011-05-27 MED ORDER — SUCCINYLCHOLINE CHLORIDE 20 MG/ML IJ SOLN
INTRAMUSCULAR | Status: DC | PRN
  Administered 2011-05-27: 100 mg via INTRAVENOUS

## 2011-05-27 MED ORDER — OXYCODONE-ACETAMINOPHEN 5-325 MG PO TABS
1.0000 | ORAL_TABLET | ORAL | Status: DC | PRN
  Administered 2011-05-28 – 2011-06-02 (×9): 2 via ORAL
  Filled 2011-05-27 (×9): qty 2

## 2011-05-27 MED ORDER — 0.9 % SODIUM CHLORIDE (POUR BTL) OPTIME
TOPICAL | Status: DC | PRN
  Administered 2011-05-27 (×3): 1000 mL

## 2011-05-27 MED ORDER — IRBESARTAN 300 MG PO TABS
300.0000 mg | ORAL_TABLET | Freq: Every day | ORAL | Status: DC
  Administered 2011-05-28 – 2011-06-02 (×6): 300 mg via ORAL
  Filled 2011-05-27 (×6): qty 1

## 2011-05-27 MED ORDER — SODIUM CHLORIDE 0.9 % IV SOLN
INTRAVENOUS | Status: DC | PRN
  Administered 2011-05-27 (×2): via INTRAVENOUS

## 2011-05-27 MED ORDER — MAGNESIUM HYDROXIDE 400 MG/5ML PO SUSP
30.0000 mL | Freq: Every day | ORAL | Status: DC | PRN
  Administered 2011-05-29: 30 mL via ORAL
  Filled 2011-05-27: qty 30

## 2011-05-27 MED ORDER — BUPIVACAINE HCL (PF) 0.25 % IJ SOLN
INTRAMUSCULAR | Status: DC | PRN
  Administered 2011-05-27: 15 mL

## 2011-05-27 MED ORDER — LABETALOL HCL 5 MG/ML IV SOLN
INTRAVENOUS | Status: DC | PRN
  Administered 2011-05-27 (×4): 10 mg via INTRAVENOUS

## 2011-05-27 MED ORDER — HYDROCODONE-ACETAMINOPHEN 5-325 MG PO TABS
1.0000 | ORAL_TABLET | ORAL | Status: DC | PRN
  Administered 2011-05-28 – 2011-06-01 (×4): 1 via ORAL
  Filled 2011-05-27 (×5): qty 1

## 2011-05-27 MED ORDER — METHYLENE BLUE 1 % INJ SOLN
INTRAMUSCULAR | Status: DC | PRN
  Administered 2011-05-27: 1 mL

## 2011-05-27 MED ORDER — CEFTRIAXONE SODIUM 1 G IJ SOLR
1.0000 g | INTRAMUSCULAR | Status: AC
  Filled 2011-05-27: qty 10

## 2011-05-27 MED ORDER — AMLODIPINE-OLMESARTAN 10-40 MG PO TABS: 1.0000 | ORAL_TABLET | Freq: Every day | ORAL | Status: DC

## 2011-05-27 MED ORDER — ONDANSETRON HCL 4 MG/2ML IJ SOLN
4.0000 mg | INTRAMUSCULAR | Status: DC | PRN
  Administered 2011-05-28: 4 mg via INTRAVENOUS
  Filled 2011-05-27: qty 2

## 2011-05-27 MED ORDER — AMLODIPINE BESYLATE 10 MG PO TABS
10.0000 mg | ORAL_TABLET | Freq: Every day | ORAL | Status: DC
  Administered 2011-05-28 – 2011-06-02 (×6): 10 mg via ORAL
  Filled 2011-05-27 (×6): qty 1

## 2011-05-27 MED ORDER — THROMBIN 20000 UNITS EX KIT
PACK | CUTANEOUS | Status: DC | PRN
  Administered 2011-05-27: 21:00:00 via TOPICAL

## 2011-05-27 MED ORDER — DEXTROSE 5 % IV SOLN
1.0000 g | INTRAVENOUS | Status: DC | PRN
  Administered 2011-05-27: 1 g via INTRAVENOUS

## 2011-05-27 MED ORDER — ONDANSETRON HCL 4 MG PO TABS: 4.0000 mg | ORAL_TABLET | ORAL | Status: DC | PRN

## 2011-05-27 MED ORDER — HYDROXYZINE HCL 50 MG/ML IM SOLN
50.0000 mg | INTRAMUSCULAR | Status: DC | PRN
  Filled 2011-05-27: qty 1

## 2011-05-27 MED ORDER — NEOSTIGMINE METHYLSULFATE 1 MG/ML IJ SOLN
INTRAMUSCULAR | Status: DC | PRN
  Administered 2011-05-27: 3 mg via INTRAVENOUS

## 2011-05-27 MED ORDER — MORPHINE SULFATE 2 MG/ML IJ SOLN
1.0000 mg | INTRAMUSCULAR | Status: DC | PRN
  Administered 2011-05-28: 2 mg via INTRAVENOUS
  Filled 2011-05-27 (×2): qty 1

## 2011-05-27 MED ORDER — SODIUM CHLORIDE 0.9 % IV SOLN
INTRAVENOUS | Status: DC
  Administered 2011-05-28: via INTRAVENOUS

## 2011-05-27 MED ORDER — PROPOFOL 10 MG/ML IV EMUL
INTRAVENOUS | Status: DC | PRN
  Administered 2011-05-27: 50 mg via INTRAVENOUS
  Administered 2011-05-27: 150 mg via INTRAVENOUS
  Administered 2011-05-27: 50 mg via INTRAVENOUS

## 2011-05-27 MED ORDER — PROMETHAZINE HCL 25 MG PO TABS: 12.5000 mg | ORAL_TABLET | ORAL | Status: DC | PRN

## 2011-05-27 MED ORDER — SODIUM CHLORIDE 0.9 % IR SOLN
Status: DC | PRN
  Administered 2011-05-27: 21:00:00

## 2011-05-27 MED ORDER — PANTOPRAZOLE SODIUM 40 MG IV SOLR
40.0000 mg | Freq: Every day | INTRAVENOUS | Status: DC
  Administered 2011-05-28 – 2011-05-29 (×2): 40 mg via INTRAVENOUS
  Filled 2011-05-27 (×3): qty 40

## 2011-05-27 MED ORDER — BISACODYL 10 MG RE SUPP: 10.0000 mg | Freq: Every day | RECTAL | Status: DC | PRN

## 2011-05-27 MED ORDER — HYDROMORPHONE HCL PF 1 MG/ML IJ SOLN
0.2500 mg | INTRAMUSCULAR | Status: DC | PRN
  Administered 2011-05-28 – 2011-05-29 (×3): 0.5 mg via INTRAVENOUS
  Filled 2011-05-27 (×3): qty 1

## 2011-05-27 MED ORDER — PHENYTOIN SODIUM EXTENDED 100 MG PO CAPS
200.0000 mg | ORAL_CAPSULE | Freq: Two times a day (BID) | ORAL | Status: DC
  Administered 2011-05-28 – 2011-06-02 (×11): 200 mg via ORAL
  Filled 2011-05-27 (×14): qty 2

## 2011-05-27 MED ORDER — ACETAMINOPHEN 650 MG RE SUPP: 650.0000 mg | RECTAL | Status: DC | PRN

## 2011-05-27 MED ORDER — GLYCOPYRROLATE 0.2 MG/ML IJ SOLN
INTRAMUSCULAR | Status: DC | PRN
  Administered 2011-05-27: 0.4 mg via INTRAVENOUS

## 2011-05-27 MED ORDER — HYDRALAZINE HCL 20 MG/ML IJ SOLN
5.0000 mg | INTRAMUSCULAR | Status: DC | PRN
  Administered 2011-05-28: 10 mg via INTRAVENOUS
  Filled 2011-05-27: qty 1
  Filled 2011-05-27: qty 0.5

## 2011-05-27 MED ORDER — HYDROXYZINE HCL 25 MG PO TABS
50.0000 mg | ORAL_TABLET | ORAL | Status: DC | PRN
  Filled 2011-05-27: qty 2

## 2011-05-27 MED ORDER — ONDANSETRON HCL 4 MG/2ML IJ SOLN
INTRAMUSCULAR | Status: DC | PRN
  Administered 2011-05-27: 4 mg via INTRAVENOUS

## 2011-05-27 MED ORDER — ROCURONIUM BROMIDE 100 MG/10ML IV SOLN
INTRAVENOUS | Status: DC | PRN
  Administered 2011-05-27: 20 mg via INTRAVENOUS
  Administered 2011-05-27: 30 mg via INTRAVENOUS
  Administered 2011-05-27: 50 mg via INTRAVENOUS

## 2011-05-27 MED ORDER — FENTANYL CITRATE 0.05 MG/ML IJ SOLN
INTRAMUSCULAR | Status: DC | PRN
  Administered 2011-05-27: 150 ug via INTRAVENOUS
  Administered 2011-05-27: 100 ug via INTRAVENOUS
  Administered 2011-05-27: 150 ug via INTRAVENOUS
  Administered 2011-05-27: 50 ug via INTRAVENOUS

## 2011-05-27 MED ORDER — FLUTICASONE PROPIONATE 50 MCG/ACT NA SUSP
2.0000 | Freq: Every day | NASAL | Status: DC
  Administered 2011-05-28 – 2011-06-02 (×6): 2 via NASAL
  Filled 2011-05-27: qty 16

## 2011-05-27 SURGICAL SUPPLY — 76 items
APPLICATOR COTTON TIP 6IN STRL (MISCELLANEOUS) ×2 IMPLANT
BAG DECANTER FOR FLEXI CONT (MISCELLANEOUS) ×2 IMPLANT
BANDAGE GAUZE 4  KLING STR (GAUZE/BANDAGES/DRESSINGS) ×2 IMPLANT
BANDAGE GAUZE ELAST BULKY 4 IN (GAUZE/BANDAGES/DRESSINGS) ×2 IMPLANT
BIT DRILL WIRE PASS 1.3MM (BIT) IMPLANT
BRUSH SCRUB EZ PLAIN DRY (MISCELLANEOUS) ×2 IMPLANT
BUR ACORN 6.0 PRECISION (BURR) ×2 IMPLANT
BUR ROUTER D-58 CRANI (BURR) ×1 IMPLANT
CANISTER SUCTION 2500CC (MISCELLANEOUS) ×3 IMPLANT
CLIP TI MEDIUM 6 (CLIP) ×2 IMPLANT
CLOTH BEACON ORANGE TIMEOUT ST (SAFETY) ×2 IMPLANT
CONT SPEC 4OZ CLIKSEAL STRL BL (MISCELLANEOUS) ×3 IMPLANT
CORDS BIPOLAR (ELECTRODE) ×1 IMPLANT
DRAIN JACKSON PRATT 10MM FLAT (MISCELLANEOUS) ×1 IMPLANT
DRAIN PENROSE 1/2X12 LTX STRL (WOUND CARE) IMPLANT
DRAIN SNY WOU 7FLT (WOUND CARE) IMPLANT
DRAPE NEUROLOGICAL W/INCISE (DRAPES) ×2 IMPLANT
DRAPE SURG 17X23 STRL (DRAPES) ×2 IMPLANT
DRAPE SURG IRRIG POUCH 19X23 (DRAPES) IMPLANT
DRAPE WARM FLUID 44X44 (DRAPE) ×2 IMPLANT
DRILL WIRE PASS 1.3MM (BIT)
DRSG ADAPTIC 3X8 NADH LF (GAUZE/BANDAGES/DRESSINGS) ×1 IMPLANT
DRSG PAD ABDOMINAL 8X10 ST (GAUZE/BANDAGES/DRESSINGS) IMPLANT
ELECT CAUTERY BLADE 6.4 (BLADE) ×1 IMPLANT
ELECT REM PT RETURN 9FT ADLT (ELECTROSURGICAL) ×2
ELECTRODE REM PT RTRN 9FT ADLT (ELECTROSURGICAL) ×1 IMPLANT
EVACUATOR SILICONE 100CC (DRAIN) ×1 IMPLANT
GAUZE SPONGE 4X4 16PLY XRAY LF (GAUZE/BANDAGES/DRESSINGS) IMPLANT
GLOVE BIO SURGEON STRL SZ 6.5 (GLOVE) ×3 IMPLANT
GLOVE BIOGEL M 8.0 STRL (GLOVE) ×1 IMPLANT
GLOVE BIOGEL PI IND STRL 6.5 (GLOVE) IMPLANT
GLOVE BIOGEL PI IND STRL 8 (GLOVE) ×1 IMPLANT
GLOVE BIOGEL PI INDICATOR 6.5 (GLOVE) ×2
GLOVE BIOGEL PI INDICATOR 8 (GLOVE) ×1
GLOVE ECLIPSE 7.5 STRL STRAW (GLOVE) ×2 IMPLANT
GLOVE EXAM NITRILE LRG STRL (GLOVE) IMPLANT
GLOVE EXAM NITRILE MD LF STRL (GLOVE) ×2 IMPLANT
GLOVE EXAM NITRILE XL STR (GLOVE) IMPLANT
GLOVE EXAM NITRILE XS STR PU (GLOVE) IMPLANT
GOWN BRE IMP SLV AUR LG STRL (GOWN DISPOSABLE) ×3 IMPLANT
GOWN BRE IMP SLV AUR XL STRL (GOWN DISPOSABLE) IMPLANT
GOWN STRL REIN 2XL LVL4 (GOWN DISPOSABLE) IMPLANT
HEMOSTAT SURGICEL 2X14 (HEMOSTASIS) ×2 IMPLANT
HOOK DURA (MISCELLANEOUS) ×2 IMPLANT
KIT BASIN OR (CUSTOM PROCEDURE TRAY) ×2 IMPLANT
KIT ROOM TURNOVER OR (KITS) ×2 IMPLANT
NDL SPNL 22GX3.5 QUINCKE BK (NEEDLE) ×1 IMPLANT
NEEDLE SPNL 22GX3.5 QUINCKE BK (NEEDLE) ×2 IMPLANT
NS IRRIG 1000ML POUR BTL (IV SOLUTION) ×3 IMPLANT
PACK CRANIOTOMY (CUSTOM PROCEDURE TRAY) ×2 IMPLANT
PAD ARMBOARD 7.5X6 YLW CONV (MISCELLANEOUS) ×3 IMPLANT
PATTIES SURGICAL .5 X.5 (GAUZE/BANDAGES/DRESSINGS) IMPLANT
PATTIES SURGICAL .5 X3 (DISPOSABLE) IMPLANT
PATTIES SURGICAL 1/4 X 3 (GAUZE/BANDAGES/DRESSINGS) ×1 IMPLANT
PATTIES SURGICAL 1X1 (DISPOSABLE) IMPLANT
PIN MAYFIELD SKULL DISP (PIN) IMPLANT
PLATE 1.5  2HOLE MED NEURO (Plate) ×2 IMPLANT
PLATE 1.5 2HOLE MED NEURO (Plate) IMPLANT
PLATE 1.5 5HOLE SQUARE (Plate) ×1 IMPLANT
SCREW SELF DRILL HT 1.5/4MM (Screw) ×7 IMPLANT
SPECIMEN JAR SMALL (MISCELLANEOUS) IMPLANT
SPONGE GAUZE 4X4 12PLY (GAUZE/BANDAGES/DRESSINGS) ×1 IMPLANT
SPONGE NEURO XRAY DETECT 1X3 (DISPOSABLE) IMPLANT
SPONGE SURGIFOAM ABS GEL 100 (HEMOSTASIS) ×2 IMPLANT
STAPLER SKIN PROX WIDE 3.9 (STAPLE) ×2 IMPLANT
SUT ETHILON 3 0 FSL (SUTURE) ×1 IMPLANT
SUT NURALON 4 0 TR CR/8 (SUTURE) ×5 IMPLANT
SUT VIC AB 2-0 CP2 18 (SUTURE) ×4 IMPLANT
SYR 20ML ECCENTRIC (SYRINGE) ×2 IMPLANT
SYR CONTROL 10ML LL (SYRINGE) ×3 IMPLANT
TOWEL OR 17X24 6PK STRL BLUE (TOWEL DISPOSABLE) ×2 IMPLANT
TOWEL OR 17X26 10 PK STRL BLUE (TOWEL DISPOSABLE) ×2 IMPLANT
TRAP SPECIMEN MUCOUS 40CC (MISCELLANEOUS) IMPLANT
TRAY FOLEY CATH 16FRSI W/METER (SET/KITS/TRAYS/PACK) ×1 IMPLANT
UNDERPAD 30X30 INCONTINENT (UNDERPADS AND DIAPERS) IMPLANT
WATER STERILE IRR 1000ML POUR (IV SOLUTION) ×2 IMPLANT

## 2011-05-27 NOTE — Preoperative (Signed)
Beta Blockers   Reason not to administer Beta Blockers:Not Applicable 

## 2011-05-27 NOTE — H&P (Signed)
Subjective: Patient is a 66 y.o. 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 Patient reports a history of a syncopal episode after voiding on February 25, 2011. He was seen at his primary physician's office later that day and sent for a CT scan of the head, done at med center [facility]. It showed small areas of traumatic subarachnoid hemorrhage and he was sent to [facility] hospital emergency room and admitted. He was seen in neurosurgery consultation by Dr. Marikay Alar. CT was repeated on 03/02/2011 and showed with Dr. Yetta Barre felt was a probable left frontal hygroma and the patient was subsequently discharged to home. He then followup with Dr. Yetta Barre about a week later, he was doing well at that time, he notes his symptoms have improved, and Dr. Yetta Barre released him and no further CTs were done.  Since then he has had difficulty with some imbalance and unsteadiness. 2 weeks ago he had another fall and again hit his head. And because of persistent unsteadiness Dr. Porfirio Oar, his primary physician, obtained an MRI scan today. That study showed the subdural hematomas as described above and he was sent to [facility] hospital emergency room for further evaluation. He was seen by Dr. Nelva Nay, who requested neurosurgical consultation.   Patient Active Problem List  Diagnoses Date Noted  . Post concussion syndrome 05/27/2011  . Subarachnoid hemorrhage 02/26/2011  . OM (otitis media) 02/25/2011  . General medical examination 06/30/2010  . HIP PAIN 12/29/2008  . ALLERGIC RHINITIS 09/19/2007  . HYPERTENSION 08/08/2006  . SEIZURE DISORDER 08/01/2006   Past Medical History  Diagnosis Date  . Seizure disorder     Dx remotely, last SZ activity aprox 1998  . Back pain     HNP dx ~2008, no surgery  . HTN (hypertension)   . Hearing loss     Rt ear sensorineural hearing loss, with hearing aide. W/u by Dr. Molli Barrows extensively negative     Past Surgical History  Procedure Date  . Hernia repair     as an infant     (Not in a hospital admission) No Known Allergies  History  Substance Use Topics  . Smoking status: Never Smoker   . Smokeless tobacco: Never Used  . Alcohol Use: Yes     occasionally    Family History  Problem Relation Age of Onset  . Emphysema Mother     smoker  . Hypertension Father     F and B   . Leukemia Sister   . Diabetes Neg Hx   . Prostate cancer Brother 80  . Colon cancer Neg Hx   . Coronary artery disease Neg Hx      Review of Systems Review of systems is notable for those difficulties described above as well as for history of a seizure disorder followed and managed by Dr. Avie Echevaria from [facility]. The patient has not had a seizure for over 10 years and he continues on Dilantin 200 mg twice a day at this time. Review of systems also notable for long-standing right hearing loss followed by Dr. Osborn Coho from [facility]. Review of systems is otherwise unremarkable.  Objective: Vital signs in last 24 hours: Temp:  [97.6 F (36.4 C)-98.6 F (37 C)] 97.6 F (36.4 C) (03/15 1630) Pulse Rate:  [64-69] 69  (03/15 1730) Resp:  [18-20] 20  (03/15 1730) BP: (125-141)/(74-83) 125/75 mmHg (03/15 1730) SpO2:  [97 %-  99 %] 97 % (03/15 1730) Weight:  [91.627 kg (202 lb)] 91.627 kg (202 lb) (03/15 1100)  EXAM: Patient is a well-developed well-nourished male in no acute distress. Lungs are clear to auscultation , the patient has symmetrical respiratory excursion. Heart has a regular rate and rhythm normal S1 and S2 no murmur.   Abdomen is soft nontender nondistended bowel sounds are present. Extremity examination shows no clubbing cyanosis or edema. Mental status examination shows the patient is awake, alert, oriented to his name, Vowinckel, and 05/27/2011. Speech is fluent, he has good comprehension, he follows commands quickly. Cranial nerves show pupils are equal  round and reactive to light and approximately 2-1/2 mm bilaterally. Extraocular movements are intact. Facial movement is symmetrical. Periods present on the left but is diminished on the right. Palatal movement is symmetrical. Shoulder shrug is symmetrical. Tongue is midline. Motor examination shows 5 over 5 strength in the upper and lower extremities. He has no drift of the upper extremities. Sensation is intact to pinprick. Reflexes are 1-2 in the upper and lower extremities and symmetrical. Toes are downgoing bilaterally. Gait and stance are not tested at this time due to the nature of his condition.  Data Review:CBC    Component Value Date/Time   WBC 7.1 05/27/2011 1614   RBC 4.40 05/27/2011 1614   HGB 14.1 05/27/2011 1614   HCT 42.1 05/27/2011 1614   PLT 162 05/27/2011 1614   MCV 95.7 05/27/2011 1614   MCH 32.0 05/27/2011 1614   MCHC 33.5 05/27/2011 1614   RDW 12.6 05/27/2011 1614   LYMPHSABS 1.4 05/27/2011 1614   MONOABS 0.8 05/27/2011 1614   EOSABS 0.1 05/27/2011 1614   BASOSABS 0.0 05/27/2011 1614                          BMET    Component Value Date/Time   NA 138 05/27/2011 1614   K 3.9 05/27/2011 1614   CL 104 05/27/2011 1614   CO2 26 05/27/2011 1614   GLUCOSE 88 05/27/2011 1614   BUN 11 05/27/2011 1614   CREATININE 0.83 05/27/2011 1614   CALCIUM 9.2 05/27/2011 1614   GFRNONAA >90 05/27/2011 1614   GFRAA >90 05/27/2011 1614     Assessment/Plan: Patient with bilateral 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 I've discussed with the patient, his son, and his daughter (by phone) the nature of his condition and our recommendations for treatment and care. I feel that left untreated his condition will continue to worsen with the risks of repeated falls, increased intracranial hemorrhage, and increasing neurologic dysfunction. I therefore favor craniotomy for the right subdural hematoma. I discussed the nature surgery and its risks including risks of  infection, bleeding, possibly for transfusion, increased neurologic dysfunction, including risks of weakness, paralysis, seizures, coma, and death, we've also discussed the risks of reaccumulation of the subdural hematoma and possibly for further surgery, the risk of enlargement of the left sided subdural hematoma and possible need for surgery for that, and anesthesia risks of myocardial infarction, stroke, pneumonia, and death. After discussing this thoroughly the patient and his family he wished for [place] to proceed with surgery.   Hewitt Shorts, MD 05/27/2011 6:31 PM

## 2011-05-27 NOTE — Anesthesia Preprocedure Evaluation (Addendum)
Anesthesia Evaluation  Patient identified by MRN, date of birth, ID band Patient awake    Reviewed: Allergy & Precautions, H&P , NPO status , Patient's Chart, lab work & pertinent test results  History of Anesthesia Complications Negative for: history of anesthetic complications  Airway Mallampati: I TM Distance: >3 FB Neck ROM: Full    Dental No notable dental hx. (+) Teeth Intact and Dental Advisory Given   Pulmonary neg pulmonary ROS, former smoker breath sounds clear to auscultation  Pulmonary exam normal       Cardiovascular Exercise Tolerance: Good hypertension, Pt. on medications Rate:Normal     Neuro/Psych Seizures - (last seizure 10 years ago), Well Controlled,  Last Sz. 10 yrs ago on dilantin  Presently unsteady gait- subdural hematoma    GI/Hepatic negative GI ROS, Neg liver ROS,   Endo/Other  negative endocrine ROS  Renal/GU negative Renal ROS     Musculoskeletal negative musculoskeletal ROS (+)   Abdominal   Peds  Hematology negative hematology ROS (+)   Anesthesia Other Findings   Reproductive/Obstetrics negative OB ROS                          Anesthesia Physical Anesthesia Plan  ASA: III and Emergent  Anesthesia Plan: General   Post-op Pain Management:    Induction: Intravenous, Rapid sequence and Cricoid pressure planned  Airway Management Planned: Oral ETT  Additional Equipment: Arterial line  Intra-op Plan:   Post-operative Plan: Extubation in OR  Informed Consent: I have reviewed the patients History and Physical, chart, labs and discussed the procedure including the risks, benefits and alternatives for the proposed anesthesia with the patient or authorized representative who has indicated his/her understanding and acceptance.   Dental advisory given  Plan Discussed with: CRNA, Surgeon and Anesthesiologist  Anesthesia Plan Comments: (Plan routine  monitors, A line, GETA  )       Anesthesia Quick Evaluation

## 2011-05-27 NOTE — Anesthesia Postprocedure Evaluation (Signed)
  Anesthesia Post-op Note  Patient: Joel [name]  Procedure(s) Performed: Procedure(s) (LRB): CRANIOTOMY HEMATOMA EVACUATION SUBDURAL (Right)  Patient Location: PACU  Anesthesia Type: General  Level of Consciousness: awake, alert , oriented and patient cooperative  Airway and Oxygen Therapy: Patient Spontanous Breathing  Post-op Pain: none  Post-op Assessment: Post-op Vital signs reviewed, Patient's Cardiovascular Status Stable, Respiratory Function Stable, Patent Airway, No signs of Nausea or vomiting and Pain level controlled, moves all extremities to command  Post-op Vital Signs: Reviewed and stable  Complications: No apparent anesthesia complications

## 2011-05-27 NOTE — Patient Instructions (Signed)
Please see Dr. Sandria Manly  next Tuesday at 8 AM Get the MRI done Go to the ER if severe symptoms

## 2011-05-27 NOTE — Op Note (Signed)
05/27/2011  10:22 PM  PATIENT:  Joel [name]  66 y.o. male  PRE-OPERATIVE DIAGNOSIS:  Subdural Hematoma  POST-OPERATIVE DIAGNOSIS:  Subdural Hematoma  PROCEDURE:  Procedure(s): CRANIOTOMY HEMATOMA EVACUATION SUBDURAL: Right frontoparietal craniotomy and evacuation of subdural hematoma  SURGEON:  Surgeon(s): Hewitt Shorts, MD Karn Cassis, MD  ASSISTANTS: Tanya Nones. Jeral Fruit, M.D.  ANESTHESIA:   general  EBL:  Total I/O In: 1000 [I.V.:1000] Out: -   BLOOD ADMINISTERED:none  COUNT: Correct per nursing staff  DRAINS: Subdural 10 mm Jackson-Pratt drain  SPECIMEN:  Subdural membrane  DICTATION: Patient was brought to the operating room placed under general endotracheal anesthesia. Patient was placed in a 3 pin Mayfield head holder, a roll was placed beneath the right shoulder and the patient was gently turned to the left. The right scalp was shaved and then prepped with Betadine soap and solution and draped in a sterile fashion. A straight right frontal parietal incision was made. The line of the incision was infiltrated with local anesthetic with epinephrine. Raney cuts were applied for scalp edge is to maintain hemostasis. Bipolar cautery was used to maintain hemostasis. The periosteum was elevated and self-retaining retractors were placed. Craniotomy was created by making 2 bur holes, the dura was dissected from the overlying skull, and then the craniotome attachment was used to turn the bone flap, and then the bone flap was carefully elevated from the dura, and set aside to be later reimplanted.  Multiple dural tack ups were placed around the craniotomy, using 4-0 Nurolon suture to tack the dura up to the edges of the craniotomy. The dura was opened in a U-shaped fashion hinged towards the midline. We immediately visualized a moderately thick subdural membrane. This was incised around the margins of the dural opening and tacked up to the dura with hemoclips. We gently  irrigated the subdural blood and encountered a second moderately thick subdural membrane. This was similarly incised around the margins the dural opening and again tacked up to the edges of the dura with hemoclips. We again irrigated the subdural blood beneath that membrane with normal saline until clear. The brain pulsated well, but had been significantly compressed by the subdural hematoma.  Once the irrigation of the subdural space was clear, we placed a flat Jackson-Pratt drain in the subdural space, it was brought out through a separate stab incision. We then began our closure. The dura was approximated with interrupted 4-0 Nurolon suture Gelfoam with thrombin was placed along the edges of the craniotomy. And then the bone flap was secured to the skull with Lorenz cranial plates, using 2 medium straight plates and 1 square plate.  The galea was then closed with interrupted inverted 2-0 undyed Vicryl sutures and the skin edges were closed with surgical staples. The incision and head were dressed with Adaptic and sterile gauze, and wrapped with 2 Curlex and then 2 Kling.  The patient was taken out of the 3 pin Mayfield head holder while applying the dressing, and is to be reversed and the anesthetic extubated and transferred to the recovery room for further care.  PLAN OF CARE: Admit to inpatient   PATIENT DISPOSITION:  PACU - hemodynamically stable.   Delay start of Pharmacological VTE agent (>24hrs) due to surgical blood loss or risk of bleeding:  yes

## 2011-05-27 NOTE — ED Provider Notes (Signed)
History     CSN: [id number]  Arrival date & time 05/27/11  1531   First MD Initiated Contact with Patient 05/27/11 1532      Chief Complaint  Patient presents with  . Fall    (Consider location/radiation/quality/duration/timing/severity/associated sxs/prior treatment) Patient is a 66 y.o. male presenting with fall. The history is provided by the patient.  Fall The fall occurred while walking. He landed on concrete. The point of impact was the head. He was ambulatory at the scene. There was no entrapment after the fall. There was no drug use involved in the accident. There was no alcohol use involved in the accident. Pertinent negatives include no fever. Associated symptoms comments: He reports fall in January of this year causing a "bleed in my head". He had a hospitalization without further intervention and neurosurgical follow up afterward that was normal. Two weeks ago he fell again after a sensation of walking off balance, and then again one week ago. No fall today. He reports feeling "like my legs are not as strong as before. No headaches, visual changes, nausea. He denies dizziness prior to fall. No chest pain, SOB. Marland Kitchen He has tried nothing (He was seen by his primary care provider today, Dr. Drue Novel,, who ordered an outpatient MRI brain which showed a large SDH with midline shift.) for the symptoms.    Past Medical History  Diagnosis Date  . Seizure disorder     Dx remotely, last SZ activity aprox 1998  . Back pain     HNP dx ~2008, no surgery  . HTN (hypertension)   . Hearing loss     Rt ear sensorineural hearing loss, with hearing aide. W/u by Dr. Molli Barrows extensively negative    Past Surgical History  Procedure Date  . Hernia repair     as an infant    Family History  Problem Relation Age of Onset  . Emphysema Mother     smoker  . Hypertension Father     F and B   . Leukemia Sister   . Diabetes Neg Hx   . Prostate cancer Brother 17  . Colon cancer Neg Hx   .  Coronary artery disease Neg Hx     History  Substance Use Topics  . Smoking status: Never Smoker   . Smokeless tobacco: Never Used  . Alcohol Use: Yes     occasionally      Review of Systems  Constitutional: Negative for fever and chills.  HENT: Negative.   Respiratory: Negative.   Cardiovascular: Negative.   Gastrointestinal: Negative.   Musculoskeletal: Negative.   Skin: Negative.   Neurological: Positive for weakness.       See HPI.    Allergies  Review of patient's allergies indicates no known allergies.  Home Medications   Current Outpatient Rx  Name Route Sig Dispense Refill  . AZOR 10-40 MG PO TABS  take 1 tablet by mouth once daily 30 tablet 6  . CALCIUM CARBONATE 1250 MG PO TABS Oral Take 1 tablet by mouth daily.      Marland Kitchen FLUTICASONE PROPIONATE 50 MCG/ACT NA SUSP Nasal Place 2 sprays into the nose daily.    . FUROSEMIDE 40 MG PO TABS  take 1/2 tablet by mouth twice a day 60 tablet 6  . MULTIVITAMINS PO TABS Oral Take 1 tablet by mouth daily.      Marland Kitchen FISH OIL 1200 MG PO CAPS Oral Take 1,200 mg by mouth 2 (two) times daily.     Marland Kitchen  PANTOPRAZOLE SODIUM 20 MG PO TBEC Oral Take 1 tablet (20 mg total) by mouth daily. 14 tablet 0  . PHENYTOIN SODIUM EXTENDED 100 MG PO CAPS Oral Take 200 mg by mouth 2 (two) times daily.       BP 141/83  Pulse 67  Temp(Src) 98.6 F (37 C) (Oral)  Resp 19  SpO2 99%  Physical Exam  Constitutional: He is oriented to person, place, and time. He appears well-developed and well-nourished.  HENT:  Head: Normocephalic and atraumatic.  Eyes: EOM are normal. Pupils are equal, round, and reactive to light.  Neck: Normal range of motion.  Cardiovascular: Normal rate and regular rhythm.   No murmur heard. Pulmonary/Chest: Effort normal and breath sounds normal. He has no wheezes. He has no rales.  Abdominal: Soft. There is no tenderness.  Neurological: He is alert and oriented to person, place, and time. He has normal strength and normal  reflexes. He displays normal reflexes. No cranial nerve deficit or sensory deficit. He exhibits normal muscle tone. He displays a negative Romberg sign. Coordination normal.    ED Course  Procedures (including critical care time)   Labs Reviewed  PHENYTOIN LEVEL, FREE  BASIC METABOLIC PANEL  CBC  DIFFERENTIAL  URINALYSIS, ROUTINE W REFLEX MICROSCOPIC   Mr Brain Wo Contrast  05/27/2011  *RADIOLOGY REPORT*  Clinical Data: Multiple falls.  Syncope.  Difficulty walking  MRI HEAD WITHOUT CONTRAST  Technique:  Multiplanar, multiecho pulse sequences of the brain and surrounding structures were obtained according to standard protocol without intravenous contrast.  Comparison: Head CT 03/02/2011  Findings: There are mixed age subdural hematomas bilaterally.  On the right, the subdural hematoma extends along the convexity from front to back and shows maximal thickness of 2.5 cm.  On the left, the subdural extends along the convexity from front to back with maximal thickness of 13 mm.  There is right to left midline shift of 16 mm because of the larger right subdural.  Diffusion imaging does not show any acute or subacute infarction. 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  No cortical or large vessel territory infarction.  No evidence of neoplastic mass lesion, hydrocephalus, pituitary mass, skull or skull base lesion or inflammatory sinus disease.  Major vessels at the base of the brain show flow.  IMPRESSION: Large mixed age subdural hematoma on the right, 2.5 cm in thickness with right-to-left shift of 16 mm.  Smaller mixed a subdural hematoma on the left measuring 13 mm in thickness.  Critical Value/emergent results were called by telephone at the time of interpretation on 05/27/2011  at 1410 hours  to  Dr. Drue Novel, who verbally acknowledged these results.  Original Report Authenticated By: Thomasenia Sales, M.D.     No diagnosis found.    MDM    MRI reviewed, Dr. Radford Pax has seen the patient and neurosurgery paged to him.        Rodena Medin, PA-C 05/27/11 1610

## 2011-05-27 NOTE — Progress Notes (Signed)
Subjective:  Patient resting comfortably in the recovery room. Dressing is intact. Moderate drainage into Jackson-Pratt drain.  Objective: Vital signs in last 24 hours: Filed Vitals:   05/27/11 1630 05/27/11 1730 05/27/11 1915 05/27/11 2234  BP: 125/74 125/75  140/85  Pulse: 69 69 68 62  Temp: 97.6 F (36.4 C)   97.4 F (36.3 C)  TempSrc: Oral     Resp: 18 20 19 22   SpO2: 97% 97% 97% 99%    Intake/Output from previous day:   Intake/Output this shift: Total I/O In: 1675 [I.V.:1600; Other:75] Out: 550 [Urine:300; Blood:250]  Physical Exam:  Patient is awake and alert, following commands with appropriate speech. Moving all 4 extremities well, with strong grips, and good dorsiflexion and plantarflexion bilaterally. Dressing is clean and dry.  CBC  Basename 05/27/11 1614  WBC 7.1  HGB 14.1  HCT 42.1  PLT 162   BMET  Basename 05/27/11 1614 05/27/11 1211  NA 138 138  K 3.9 3.7  CL 104 103  CO2 26 29  GLUCOSE 88 105*  BUN 11 12  CREATININE 0.83 0.9  CALCIUM 9.2 9.1   Studies/Results: Mr Brain Wo Contrast  05/27/2011  *RADIOLOGY REPORT*  Clinical Data: Multiple falls.  Syncope.  Difficulty walking  MRI HEAD WITHOUT CONTRAST  Technique:  Multiplanar, multiecho pulse sequences of the brain and surrounding structures were obtained according to standard protocol without intravenous contrast.  Comparison: Head CT 03/02/2011  Findings: There are mixed age subdural hematomas bilaterally.  On the right, the subdural hematoma extends along the convexity from front to back and shows maximal thickness of 2.5 cm.  On the left, the subdural extends along the convexity from front to back with maximal thickness of 13 mm.  There is right to left midline shift of 16 mm because of the larger right subdural.  Diffusion imaging does not show any acute or subacute infarction. 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  No cortical or large  vessel territory infarction.  No evidence of neoplastic mass lesion, hydrocephalus, pituitary mass, skull or skull base lesion or inflammatory sinus disease.  Major vessels at the base of the brain show flow.  IMPRESSION: Large mixed age subdural hematoma on the right, 2.5 cm in thickness with right-to-left shift of 16 mm.  Smaller mixed a subdural hematoma on the left measuring 13 mm in thickness.  Critical Value/emergent results were called by telephone at the time of interpretation on 05/27/2011  at 1410 hours  to  Dr. Drue Novel, who verbally acknowledged these results.  Original Report Authenticated By: Thomasenia Sales, M.D.    Assessment/Plan: Stable following surgery, will continue to monitor condition closely in the neurosurgery ICU. Have spoken with the patient's family regarding findings at surgery and condition in the recovery room.   Hewitt Shorts, MD 05/27/2011, 11:05 PM

## 2011-05-27 NOTE — Assessment & Plan Note (Addendum)
Patient presents with ongoing problems with off-balance,  Sx started after a fall 12-12, there was a question SAH but eventually belived to be a hygroma. Differential diagnosis is postconcussion syndrome, it also could be related to Dilantin or a cerebellar atrophy.  I discussed the case with the patient's neurologist, Dr. Sandria Manly, we agreed to get a MRI w/o and W he will be able to see him next Tuesday at 8 AM. Patient aware. ER if symptoms increase. Needs also to check a Dilantin level. Addendum, significant other asked for a sed rate, will do Addendum: Call from rads, has a large subdural bleed, we are sending him to [facility] ER stat. Called ER MD who is aware Called Dr Jule Ser for consult (on call for neurosurgery), discuss case w/ him Also spoke w/ the patient and explained the situation JP

## 2011-05-27 NOTE — Progress Notes (Signed)
  Subjective:    Patient ID: Joel [name], male    DOB: 1945/08/30, 66 y.o.   MRN: [id number]  HPI Follow up. Please see previus note: Since the last time he was here he felt slt better, saw Dr Yetta Barre, neurosurgery, b/c he was better, no further XR were ordered. Then the sx came back:  unsteady gait, imbalance. Had 2 falls: 2 weeks ago and 1 week ago, hit his head, no LOC, falls were not provoked just from poor balance. No increase sx since the fall   Past Medical History  Diagnosis Date  . Seizure disorder     Dx remotely, last SZ activity aprox 1998  . Back pain     HNP dx ~2008, no surgery  . HTN (hypertension)   . Hearing loss     Rt ear sensorineural hearing loss, with hearing aide. W/u by Dr. Molli Barrows extensively negative   Past Surgical History  Procedure Date  . Hernia repair     as an infant    Review of Systems Denies any actual headache, nausea or vomiting. No actual dizziness or spinning. His BP is usually normal. No motor deficits. No slurred speech.     Objective:   Physical Exam General -- alert, well-developed, and well-nourished.   Lungs -- normal respiratory effort, no intercostal retractions, no accessory muscle use, and normal breath sounds.   Heart-- normal rate, regular rhythm, no murmur, and no gallop.   extremities-- no pretibial edema bilaterally Neurologic-- alert & oriented X3 and strength normal in all extremities. Speech is clear, gait show no wide base, normal arm swing. Memory seems intact. Psych-- Cognition and judgment appear intact. Alert and cooperative with normal attention span and concentration.  not anxious appearing and not depressed appearing.         Assessment & Plan:  Today , I spent more than 50 min with the patient, >50% of the time counseling, and coordinating patient care

## 2011-05-27 NOTE — ED Notes (Addendum)
Pt sent from outpt MRI center for diagnosis of intercranial bleed. Pt has had recent frequent falls and dizziness. Pt A&O x4.

## 2011-05-27 NOTE — ED Provider Notes (Addendum)
Medical screening examination/treatment/procedure(s) were conducted as a shared visit with non-physician practitioner(s) and myself.  I personally evaluated the patient during the encounter       .Face to face Exam:  General:  A&Ox3 HEENT:  Atraumatic Resp:  Normal effort Abd:  Nondistended Neuro:No focal deficits    Nelia Shi, MD 05/27/11 1717  Nelia Shi, MD 05/27/11 [phone]

## 2011-05-27 NOTE — ED Notes (Signed)
Patient can not obtain urine at this time.

## 2011-05-27 NOTE — Transfer of Care (Signed)
Immediate Anesthesia Transfer of Care Note  Patient: Joel [name]  Procedure(s) Performed: Procedure(s) (LRB): CRANIOTOMY HEMATOMA EVACUATION SUBDURAL (Right)  Patient Location: PACU  Anesthesia Type: General  Level of Consciousness: awake, alert , oriented and patient cooperative  Airway & Oxygen Therapy: Patient Spontanous Breathing and Patient connected to nasal cannula oxygen  Post-op Assessment: Report given to PACU RN, Post -op Vital signs reviewed and stable and Patient moving all extremities  Post vital signs: Reviewed and stable  Complications: No apparent anesthesia complications

## 2011-05-28 LAB — MRSA PCR SCREENING: MRSA by PCR: NEGATIVE

## 2011-05-28 LAB — BASIC METABOLIC PANEL
BUN: 11 mg/dL (ref 6–23)
CO2: 23 mEq/L (ref 19–32)
Calcium: 8.4 mg/dL (ref 8.4–10.5)
Chloride: 105 mEq/L (ref 96–112)
Creatinine, Ser: 0.76 mg/dL (ref 0.50–1.35)
GFR calc Af Amer: >90 mL/min (ref >90)
GFR calc non Af Amer: >90 mL/min (ref >90)
Glucose, Bld: 151 mg/dL — ABNORMAL HIGH (ref 70–99)
Potassium: 3.6 mEq/L (ref 3.5–5.1)
Sodium: 137 mEq/L (ref 135–145)

## 2011-05-28 LAB — URINALYSIS, ROUTINE W REFLEX MICROSCOPIC
Ketones, ur: 15 mg/dL — AB
Nitrite: NEGATIVE
Specific Gravity, Urine: 1.019 (ref 1.005–1.030)
Urobilinogen, UA: 1 mg/dL (ref 0.0–1.0)
pH: 7 (ref 5.0–8.0)

## 2011-05-28 LAB — URINE MICROSCOPIC-ADD ON

## 2011-05-28 LAB — DIFFERENTIAL
Basophils Absolute: 0 10*3/uL (ref 0.0–0.1)
Basophils Relative: 0 % (ref 0–1)
Eosinophils Absolute: 0 10*3/uL (ref 0.0–0.7)
Eosinophils Relative: 0 % (ref 0–5)
Lymphocytes Relative: 9 % — ABNORMAL LOW (ref 12–46)
Lymphs Abs: 1.2 10*3/uL (ref 0.7–4.0)
Monocytes Absolute: 0.6 10*3/uL (ref 0.1–1.0)
Monocytes Relative: 5 % (ref 3–12)
Neutro Abs: 11.4 10*3/uL — ABNORMAL HIGH (ref 1.7–7.7)
Neutrophils Relative %: 86 % — ABNORMAL HIGH (ref 43–77)

## 2011-05-28 LAB — CBC
HCT: 39.6 % (ref 39.0–52.0)
Hemoglobin: 13.5 g/dL (ref 13.0–17.0)
MCH: 32.2 pg (ref 26.0–34.0)
MCHC: 34.1 g/dL (ref 30.0–36.0)
MCV: 94.5 fL (ref 78.0–100.0)
Platelets: 148 10*3/uL — ABNORMAL LOW (ref 150–400)
RBC: 4.19 MIL/uL — ABNORMAL LOW (ref 4.22–5.81)
RDW: 12.3 % (ref 11.5–15.5)
WBC: 13.2 10*3/uL — ABNORMAL HIGH (ref 4.0–10.5)

## 2011-05-28 LAB — PHENYTOIN LEVEL, TOTAL: Phenytoin Lvl: 8.9 ug/mL — ABNORMAL LOW (ref 10.0–20.0)

## 2011-05-28 MED ORDER — PHENYTOIN SODIUM EXTENDED 100 MG PO CAPS
300.0000 mg | ORAL_CAPSULE | Freq: Once | ORAL | Status: AC
  Administered 2011-05-28: 300 mg via ORAL
  Filled 2011-05-28: qty 3

## 2011-05-28 NOTE — Progress Notes (Signed)
Subjective:  Patient resting comfortably in bed, without complaints.  Objective: Vital signs in last 24 hours: Filed Vitals:   05/27/11 2300 05/27/11 2330 05/27/11 2345 05/28/11 0000  BP: 147/84 150/87 147/84 148/86  Pulse: 59 57 59 59  Temp: 97.6 F (36.4 C)  98.1 F (36.7 C) 98.1 F (36.7 C)  TempSrc: Oral     Resp: 22 19 21 18   Height:    6\' 1"  (1.854 m)  Weight:    93.8 kg (206 lb 12.7 oz)  SpO2: 98% 98% 98% 98%    Intake/Output from previous day: 03/15 0701 - 03/16 0700 In: 1710 [I.V.:1600] Out: 635 [Urine:385; Blood:250] Intake/Output this shift: Total I/O In: 1710 [I.V.:1600; Other:110] Out: 635 [Urine:385; Blood:250]  Physical Exam:  Awake, alert, oriented fully. Dressing is clean and dry. Extraocular movements intact. Facial movement symmetrical. Moving all 4 extremities well with good strength.  Assessment/Plan: Doing well following surgery. We'll continue to monitor and we'll check CT of head on March 17, or sooner if he has difficulties.   Hewitt Shorts, MD 05/28/2011, 2:12 AM

## 2011-05-28 NOTE — Progress Notes (Signed)
Subjective:  Patient resting comfortably in bed, without complaints. Mild incisional discomfort. Dilantin level this morning was 8.9. Moderate drainage continues and the Jackson-Pratt drain.  Objective: Vital signs in last 24 hours: Filed Vitals:   05/28/11 0400 05/28/11 0500 05/28/11 0600 05/28/11 0700  BP: 143/72 145/75 151/74   Pulse: 65 73 68 69  Temp: 97.9 F (36.6 C) 98.1 F (36.7 C) 98.1 F (36.7 C) 98.4 F (36.9 C)  TempSrc:   Core (Comment)   Resp: 19 23 18 19   Height:      Weight:      SpO2: 99% 99% 99% 99%    Intake/Output from previous day: 03/15 0701 - 03/16 0700 In: 1968.3 [I.V.:1735.3; IV Piggyback:31] Out: 1020 [Urine:770; Blood:250] Intake/Output this shift:    Physical Exam:  Patient is awake and alert, fully oriented. Pupils are equal round and reactive light. Extraocular movements are intact. Facial movement is symmetrical. Moving all 4 extremities well, no drift of upper extremities.  CBC  Basename 05/28/11 0450 05/27/11 1614  WBC 13.2* 7.1  HGB 13.5 14.1  HCT 39.6 42.1  PLT 148* 162   BMET  Basename 05/28/11 0450 05/27/11 1614  NA 137 138  K 3.6 3.9  CL 105 104  CO2 23 26  GLUCOSE 151* 88  BUN 11 11  CREATININE 0.76 0.83  CALCIUM 8.4 9.2    Assessment/Plan: Patient is doing well following surgery last night. Still moderate drainage from the Jackson-Pratt drain. Dilantin level is low and we will give an additional 300 mg by mouth this morning and recheck Dilantin level on March 18. We'll begin clear liquids and out of bed to chair, and we'll check CT of brain without contrast in the morning.   Hewitt Shorts, MD 05/28/2011, 7:58 AM

## 2011-05-28 NOTE — Plan of Care (Signed)
Problem: Consults Goal: Diagnosis - Craniotomy Outcome: Progressing Subdural hematoma     

## 2011-05-29 ENCOUNTER — Inpatient Hospital Stay (HOSPITAL_COMMUNITY): Payer: BC Managed Care – PPO

## 2011-05-29 NOTE — Progress Notes (Signed)
Subjective:  Patient sitting up in bed, without complaints. Taking by mouth clear liquids without difficulty. Anxious to progress. Subdural Jackson-Pratt drain is slowly decreasing output now down to about 20-25 cc per 12 hour shift. Drainage also is becoming somewhat thinner sanguinous.  Objective: Vital signs in last 24 hours: Filed Vitals:   05/29/11 0500 05/29/11 0600 05/29/11 0700 05/29/11 0800  BP: 162/87 161/82 161/87 156/88  Pulse: 79 73 73 81  Temp: 99.7 F (37.6 C) 99.7 F (37.6 C) 99.9 F (37.7 C) 99.9 F (37.7 C)  TempSrc:      Resp: 23 21 21 21   Height:      Weight:      SpO2: 97% 97% 97% 96%    Intake/Output from previous day: 03/16 0701 - 03/17 0700 In: 490 [I.V.:480; IV Piggyback:10] Out: 3245 [Urine:3000; Drains:245] Intake/Output this shift: Total I/O In: 20 [I.V.:20] Out: 300 [Urine:300]  Physical Exam:  Awake, alert, fully oriented. Cranial nerves remain intact. Moving all 4 extremities well.  CBC  Basename 05/28/11 0450 05/27/11 1614  WBC 13.2* 7.1  HGB 13.5 14.1  HCT 39.6 42.1  PLT 148* 162   BMET  Basename 05/28/11 0450 05/27/11 1614  NA 137 138  K 3.6 3.9  CL 105 104  CO2 23 26  GLUCOSE 151* 88  BUN 11 11  CREATININE 0.76 0.83  CALCIUM 8.4 9.2    Assessment/Plan: CT this morning shows good evacuation of large right hemispheric mixed density subdural hematoma, there remains a small amount of residual subdural collection on the right side, and the subdural hematomas on the left side are unchanged in appearance. With JP drainage decreasing and CT looking good we'll continue to monitor JP drainage and it continues to decrease or becomes even thinner upon discontinuing the drain probably tomorrow and then repeating a CT the following day. For now we'll DC A. line, DC Foley, advanced to regular diet, ambulated ICU, and change IV to saline lock. Dilantin level to be checked tomorrow.   Hewitt Shorts, MD 05/29/2011, 9:24 AM

## 2011-05-30 LAB — PHENYTOIN LEVEL, TOTAL: Phenytoin Lvl: 10.5 ug/mL (ref 10.0–20.0)

## 2011-05-30 MED ORDER — SENNA 8.6 MG PO TABS
1.0000 | ORAL_TABLET | Freq: Every evening | ORAL | Status: DC | PRN
  Administered 2011-05-30: 8.6 mg via ORAL
  Filled 2011-05-30 (×2): qty 1

## 2011-05-30 MED ORDER — PANTOPRAZOLE SODIUM 40 MG PO TBEC
40.0000 mg | DELAYED_RELEASE_TABLET | Freq: Every day | ORAL | Status: DC
  Administered 2011-05-30 – 2011-06-02 (×4): 40 mg via ORAL
  Filled 2011-05-30 (×4): qty 1

## 2011-05-30 NOTE — Progress Notes (Signed)
Subjective:  Patient sitting up in bed, eating breakfast, without complaints. J-P drain has continued to put out less than less, 10 cc over the night shift, 5 cc this morning. Patient has been up and living in the ICU.  Objective: Vital signs in last 24 hours: Filed Vitals:   05/30/11 0400 05/30/11 0500 05/30/11 0600 05/30/11 0700  BP: 151/91 149/87  144/84  Pulse: 79 75 75 73  Temp:      TempSrc:      Resp: 21 21 20 19   Height:      Weight:      SpO2: 97% 97% 97% 96%    Intake/Output from previous day: 03/17 0701 - 03/18 0700 In: 955 [P.O.:880; I.V.:65; IV Piggyback:10] Out: 2810 [Urine:2800; Drains:10] Intake/Output this shift:    Physical Exam:  Patient is awake and alert, fully oriented. Cranial nerves are intact. Moving all extremities well.  Studies/Results: Ct Head Wo Contrast  05/29/2011  *RADIOLOGY REPORT*  Clinical Data: Status post evacuation of subdural hematoma.  CT HEAD WITHOUT CONTRAST  Technique:  Contiguous axial images were obtained from the base of the skull through the vertex without contrast.  Comparison: MRI brain 05/27/2011.  Findings: The patient is status post right frontal craniotomy.  A subdural drain is in place.  There is significant decrease in the volume of extra-axial blood on the right.  Midline shift is considerably improved now measuring 4.29mm at the foramen of Monro, compared with 10 mm.  A smaller left mixed density extra-axial collection is stable, compatible with a left-sided subdural hematoma.  No acute infarct, parenchymal hemorrhage, or mass lesion is present.  The ventricles are of normal size.  There is less effacement of the right lateral ventricle.  Gas is present in the right extra-axial space.  The paranasal sinuses and mastoid air cells are clear.  Apart from craniotomy, the osseous skull is intact.  IMPRESSION:  1.  Status post right craniotomy with significant decompression of the right subdural hematoma. 2.  There are some mixed density  extra-axial blood products remaining on the right. 3.  Significant decrease in midline shift, now measuring 4.5 mm at the foramen of Monro. 4.  Persistent left next density extra-axial collection compatible with a smaller subdural hematoma.  This collection has not changed significantly.  Original Report Authenticated By: Jamesetta Orleans. MATTERN, M.D.    Assessment/Plan: JP drain removed and exit site and incision redress. Incision is healing well, without swelling, erythema, or drainage. Patient to be restudied with CT tomorrow morning, we'll continue to monitor in ICU.  Hewitt Shorts, MD 05/30/2011, 8:32 AM

## 2011-05-31 ENCOUNTER — Inpatient Hospital Stay (HOSPITAL_COMMUNITY): Payer: BC Managed Care – PPO

## 2011-05-31 ENCOUNTER — Encounter (HOSPITAL_COMMUNITY): Payer: Self-pay | Admitting: Neurosurgery

## 2011-05-31 LAB — COMPREHENSIVE METABOLIC PANEL
ALT: 11 U/L (ref 0–53)
AST: 11 U/L (ref 0–37)
Albumin: 2.9 g/dL — ABNORMAL LOW (ref 3.5–5.2)
Alkaline Phosphatase: 81 U/L (ref 39–117)
BUN: 12 mg/dL (ref 6–23)
CO2: 26 mEq/L (ref 19–32)
Calcium: 9 mg/dL (ref 8.4–10.5)
Chloride: 104 mEq/L (ref 96–112)
Creatinine, Ser: 0.8 mg/dL (ref 0.50–1.35)
GFR calc Af Amer: >90 mL/min (ref >90)
GFR calc non Af Amer: >90 mL/min (ref >90)
Glucose, Bld: 99 mg/dL (ref 70–99)
Potassium: 3.6 mEq/L (ref 3.5–5.1)
Sodium: 137 mEq/L (ref 135–145)
Total Bilirubin: 0.4 mg/dL (ref 0.3–1.2)
Total Protein: 6.2 g/dL (ref 6.0–8.3)

## 2011-05-31 NOTE — Progress Notes (Signed)
Utilization review completed. Cavon Nicolls, RN, BSN. 05/31/11  

## 2011-05-31 NOTE — Progress Notes (Signed)
Subjective:  Patient resting in bed, without complaints. Has been using Percocet for pain. Ambulate twice in ICU yesterday.  Objective: Vital signs in last 24 hours: Filed Vitals:   05/31/11 0200 05/31/11 0300 05/31/11 0400 05/31/11 0600  BP: 125/71 122/75 136/78 120/74  Pulse: 73 69 72 66  Temp:      TempSrc:      Resp: 17 18 18 16   Height:      Weight:      SpO2: 95% 96% 96% 96%    Intake/Output from previous day: 03/18 0701 - 03/19 0700 In: 840 [P.O.:840] Out: 805 [Urine:800; Drains:5] Intake/Output this shift:    Physical Exam:  Awake and alert, oriented. Following commands. Moving all extremities well.   BMET  Basename 05/31/11 0423  NA 137  K 3.6  CL 104  CO2 26  GLUCOSE 99  BUN 12  CREATININE 0.80  CALCIUM 9.0    Assessment/Plan: CT scan this morning shows small amount of recurrent/residual right hemispheric subdural hematoma as well as persistence of left hemispheric subdural hematoma. However overall mass effect and shift as compared to prior surgery is much decreased. We'll plan on monitoring subdural hematomas over time with serial CT scans. Nursing staff change scalp dressing today. Have encouraged to ambulate at least 4-5 times in the ICU today.   Hewitt Shorts, MD 05/31/2011, 7:42 AM

## 2011-06-01 LAB — PHENYTOIN LEVEL, FREE AND TOTAL
Phenytoin Bound: 8.8 mg/L
Phenytoin, Free: 1.5 mg/L (ref 1.0–2.0)
Phenytoin, Total: 10.3 mg/L (ref 10.0–20.0)

## 2011-06-01 NOTE — Progress Notes (Signed)
Subjective:  Patient sitting up in chair, without complaints. Has been up and living in the ICU.  Objective: Vital signs in last 24 hours: Filed Vitals:   06/01/11 0900 06/01/11 1000 06/01/11 1100 06/01/11 1204  BP: 112/63 123/79 135/80   Pulse: 74 75 72   Temp:    98.7 F (37.1 C)  TempSrc:    Oral  Resp: 18 21 21    Height:      Weight:      SpO2: 98% 97% 99%     Intake/Output from previous day: 03/19 0701 - 03/20 0700 In: 840 [P.O.:840] Out: -  Intake/Output this shift: Total I/O In: 400 [P.O.:400] Out: 500 [Urine:500]  Physical Exam:  Wound is healing nicely, clean and dry. Moving all extremities well.  BMET  Basename 05/31/11 0423  NA 137  K 3.6  CL 104  CO2 26  GLUCOSE 99  BUN 12  CREATININE 0.80  CALCIUM 9.0    Studies/Results: Ct Head Wo Contrast  05/31/2011  *RADIOLOGY REPORT*  Clinical Data: 66 year old male with subdural hemorrhage.  CT HEAD WITHOUT CONTRAST  Technique:  Contiguous axial images were obtained from the base of the skull through the vertex without contrast.  Comparison: 05/29/2011 and earlier.  Findings: Visualized orbit soft tissues are within normal limits. Right scalp postoperative changes following right frontotemporal craniotomy. Visualized paranasal sinuses and mastoids are clear. Stable visualized osseous structures.  Right subdural drain has been removed.  A small volume of pneumocephalus has decreased.  Mixed density right subdural collection now measures up to 10-13 mm in thickness along the right anterior and lateral convexity.  Leftward midline shift of 5 mm is stable.  Basilar cisterns are patent and stable.  No intraventricular hemorrhage or ventriculomegaly.  Smaller mixed density left subdural hematoma is stable measuring 8-10 mm in thickness.  No new areas of intracranial hemorrhage identified. Small left middle cranial fossa arachnoid cyst is stable. No evidence of cortically based acute infarction identified.  IMPRESSION: 1.   Right subdural drain removed.  Stable mixed density right subdural collection measuring 10-13 mm in thickness. 2.  Stable leftward midline shift of 5 mm. 3.  Stable left mixed density subdural measuring 8-10 mm in thickness. 4.  No new intracranial abnormality.  Original Report Authenticated By: Harley Hallmark, M.D.    Assessment/Plan: We'll transfer to the 3000 unit. I've asked his significant other Dr. Dow Adolph to remove his staples in 3 days, and gave her a staple removal device. I explained to Dr. Audria Nine and the patient his limitations in the postoperative period, which include no driving, but he can ride in the car locally with friends and family driving. Also no heavy or strenuous activities., We don't want him lifting more than a plate of food for the first week and then with a light grocery bags second week. The other hand we will him walking outside in the fresh air each day. He is going to need to return for followup with me in 2-3 weeks with a repeat CT scan of the head.   Hewitt Shorts, MD 06/01/2011, 1:24 PM

## 2011-06-02 MED ORDER — HYDROCODONE-ACETAMINOPHEN 5-325 MG PO TABS: 1.0000 | ORAL_TABLET | ORAL | Status: DC | PRN

## 2011-06-02 NOTE — Discharge Summary (Signed)
Physician Discharge Summary  Patient ID: Joel [name] MRN: [id number] DOB/AGE: Jan 21, 1946 66 y.o.  Admit date: 05/27/2011 Discharge date: 06/02/2011  Admission Diagnoses: Bilateral subdural hematomas, right larger than left, with increasing imbalance and unsteadiness  Discharge Diagnoses: Bilateral subdural hematomas, right larger than left, with increasing imbalance and unsteadiness   Discharged Condition: good  Hospital Course: Patient presented to his primary physician with progressive imbalance and unsteadiness, he was studied with MRI which revealed bilateral mixed density subdural hematomas, right greater than left, with right to left shift. Patient was admitted and taken to surgery for a right frontoparietal craniotomy and evacuation of subdural hematoma. A Jackson-Pratt drain was left in the subdural space and was removed on the third postoperative day. His incision is healed nicely and he is up and ambulating in the halls actively. He has remained neurologically intact throughout. He is being discharged to home and he is to be scheduled for followup with me in the office in 3 weeks with a CT scan of the brain without contrast that day. His significant other is going to remove his staples in 2 days.  Discharge Exam: Blood pressure 128/79, pulse 72, temperature 98.7 F (37.1 C), temperature source Oral, resp. rate 19, height 6\' 1"  (1.854 m), weight 93.8 kg (206 lb 12.7 oz), SpO2 94.00%.  Disposition: Home   Medication List  As of 06/02/2011 10:21 AM   STOP taking these medications         aspirin 81 MG chewable tablet      Fish Oil 1200 MG Caps         TAKE these medications         AZOR 10-40 MG per tablet   Generic drug: amLODipine-olmesartan   Take 1 tablet by mouth daily.      CALCIUM 500 1250 MG tablet   Generic drug: calcium carbonate   Take 1 tablet by mouth daily.      fluticasone 50 MCG/ACT nasal spray   Commonly known as: FLONASE   Place 2 sprays into  the nose daily.      furosemide 40 MG tablet   Commonly known as: LASIX   Take 20 mg by mouth 2 (two) times daily.      HYDROcodone-acetaminophen 5-325 MG per tablet   Commonly known as: NORCO   Take 1-2 tablets by mouth every 4 (four) hours as needed for pain.      multivitamin per tablet   Take 1 tablet by mouth daily.      phenytoin 100 MG ER capsule   Commonly known as: DILANTIN   Take 200 mg by mouth 2 (two) times daily.           Follow-up Information    Follow up with Willow Ora, MD .         Signed: Hewitt Shorts, MD 06/02/2011, 10:21 AM

## 2011-06-02 NOTE — Progress Notes (Signed)
Patient discharge instructions and education provided to patient and family. All questions answered to patient and family's satisfaction. D/C home with no signs of acute distress.

## 2011-06-06 ENCOUNTER — Encounter (HOSPITAL_COMMUNITY): Payer: Self-pay | Admitting: Emergency Medicine

## 2011-06-06 ENCOUNTER — Emergency Department (HOSPITAL_COMMUNITY)
Admission: EM | Admit: 2011-06-06 | Discharge: 2011-06-07 | Disposition: A | Discharge status: Home / Self Care | Payer: BC Managed Care – PPO | Attending: Emergency Medicine | Admitting: Emergency Medicine

## 2011-06-06 ENCOUNTER — Emergency Department (HOSPITAL_COMMUNITY): Payer: BC Managed Care – PPO

## 2011-06-06 DIAGNOSIS — Z79899 Other long term (current) drug therapy: Secondary | ICD-10-CM | POA: Insufficient documentation

## 2011-06-06 DIAGNOSIS — R209 Unspecified disturbances of skin sensation: Secondary | ICD-10-CM | POA: Insufficient documentation

## 2011-06-06 DIAGNOSIS — I10 Essential (primary) hypertension: Secondary | ICD-10-CM | POA: Insufficient documentation

## 2011-06-06 DIAGNOSIS — R200 Anesthesia of skin: Secondary | ICD-10-CM

## 2011-06-06 DIAGNOSIS — Z8679 Personal history of other diseases of the circulatory system: Secondary | ICD-10-CM

## 2011-06-06 LAB — BASIC METABOLIC PANEL
Calcium: 9.7 mg/dL (ref 8.4–10.5)
GFR calc Af Amer: >90 mL/min (ref >90)
GFR calc non Af Amer: >90 mL/min (ref >90)
Glucose, Bld: 97 mg/dL (ref 70–99)
Potassium: 3.8 mEq/L (ref 3.5–5.1)
Sodium: 137 mEq/L (ref 135–145)

## 2011-06-06 LAB — CBC
MCH: 32.9 pg (ref 26.0–34.0)
MCHC: 35.1 g/dL (ref 30.0–36.0)
MCV: 93.7 fL (ref 78.0–100.0)
Platelets: 248 10*3/uL (ref 150–400)
RDW: 11.7 % (ref 11.5–15.5)

## 2011-06-06 LAB — DIFFERENTIAL
Basophils Relative: 0 % (ref 0–1)
Eosinophils Absolute: 0.1 10*3/uL (ref 0.0–0.7)
Eosinophils Relative: 1 % (ref 0–5)
Neutrophils Relative %: 71 % (ref 43–77)

## 2011-06-06 NOTE — ED Provider Notes (Addendum)
History     CSN: [id number]  Arrival date & time 06/06/11  1908   First MD Initiated Contact with Patient 06/06/11 2210      Chief Complaint  Patient presents with  . Numbness    (Consider location/radiation/quality/duration/timing/severity/associated sxs/prior treatment) HPI Comments: The patient is 10 days status post craniotomy by Dr. Newell Coral to drain subdural hematoma.  He has been doing fine until three days ago.  He started to develop numbness in the left hand, face, and foot.  He denies any fever, chills, headache, and has had no other complaints.  He has tried to call Dr. Earl Gala office on two occasions but has not received a return call.  The history is provided by the patient.    Past Medical History  Diagnosis Date  . Seizure disorder     Dx remotely, last SZ activity aprox 1998  . Back pain     HNP dx ~2008, no surgery  . HTN (hypertension)   . Hearing loss     Rt ear sensorineural hearing loss, with hearing aide. W/u by Dr. Molli Barrows extensively negative    Past Surgical History  Procedure Date  . Hernia repair     as an infant  . Craniotomy 05/27/2011    Procedure: CRANIOTOMY HEMATOMA EVACUATION SUBDURAL;  Surgeon: Hewitt Shorts, MD;  Location: [facility];  Service: Neurosurgery;  Laterality: Right;  Evacuation of Subdural Hematoma    Family History  Problem Relation Age of Onset  . Emphysema Mother     smoker  . Hypertension Father     F and B   . Leukemia Sister   . Diabetes Neg Hx   . Prostate cancer Brother 22  . Colon cancer Neg Hx   . Coronary artery disease Neg Hx     History  Substance Use Topics  . Smoking status: Never Smoker   . Smokeless tobacco: Never Used  . Alcohol Use: Yes     occasionally      Review of Systems  All other systems reviewed and are negative.    Allergies  Review of patient's allergies indicates no known allergies.  Home Medications   Current Outpatient Rx  Name Route Sig Dispense Refill  .  AMLODIPINE-OLMESARTAN 10-40 MG PO TABS Oral Take 1 tablet by mouth daily.    Marland Kitchen CALCIUM + D PO Oral Take 1 tablet by mouth 2 (two) times daily.    Marland Kitchen FLUTICASONE PROPIONATE 50 MCG/ACT NA SUSP Nasal Place 2 sprays into the nose daily.    . FUROSEMIDE 40 MG PO TABS Oral Take 20 mg by mouth 2 (two) times daily.    Marland Kitchen HYDROCODONE-ACETAMINOPHEN 5-325 MG PO TABS Oral Take 1-2 tablets by mouth every 4 (four) hours as needed. For pain    . ADULT MULTIVITAMIN W/MINERALS CH Oral Take 1 tablet by mouth daily.    Marland Kitchen PHENYTOIN SODIUM EXTENDED 100 MG PO CAPS Oral Take 200 mg by mouth 2 (two) times daily.       BP 142/80  Pulse 68  Temp(Src) 98.2 F (36.8 C) (Oral)  Resp 18  SpO2 99%  Physical Exam  Nursing note and vitals reviewed. Constitutional: He is oriented to person, place, and time. He appears well-developed and well-nourished. No distress.  HENT:  Head: Normocephalic and atraumatic.       The right parietal incision appears to be healing well.  There is no redness, drainage, or erythema.  Eyes: EOM are normal. Pupils are equal, round, and  reactive to light.  Neck: Normal range of motion. Neck supple.  Cardiovascular: Normal rate and regular rhythm.   No murmur heard. Pulmonary/Chest: Effort normal and breath sounds normal.  Abdominal: Soft. Bowel sounds are normal.  Musculoskeletal: Normal range of motion. He exhibits no edema and no tenderness.  Neurological: He is alert and oriented to person, place, and time. No cranial nerve deficit. He exhibits normal muscle tone. Coordination normal.  Skin: Skin is warm and dry. He is not diaphoretic.    ED Course  Procedures (including critical care time)   Labs Reviewed  CBC  DIFFERENTIAL  BASIC METABOLIC PANEL   No results found.   No diagnosis found.    MDM  The Ct performed shows no change from prior post-op ct.  I spoke with Dr. Lovell Sheehan who does not feel as though there is any emergent problem.  He wants patient to discuss a  possible increase in his dilantin with his neurologist.  He is to follow up with both neurology and neurosurgery in the next 1-2 days.        Geoffery Lyons, MD 06/06/11 3016  Geoffery Lyons, MD 06/06/11 [phone]

## 2011-06-06 NOTE — ED Notes (Signed)
s/p craniotomy x10 days for evacuation of bilat subdural hematomas secondary to trauma; presents tonight with 4 day hx of interm left facial numbness, slurred speech, LUE numbness and tingling; states symptoms last only briefly and occur at random times; presently caox4; speech clear, no neurological deficits noted at this time; craniotomy incisions noted to scalp; appear to be well-healing

## 2011-06-06 NOTE — ED Notes (Signed)
Patient transported to CT 

## 2011-06-06 NOTE — ED Notes (Signed)
PT. REPORTS LEFT FOREARM NUMBNESS /LEFT FACIAL NUMBNESS FOR SEVERAL DAYS , STATES CRANIOTOMY BY DR. Newell Coral 10 DAYS AGO , REPORTS HE CALLED HIS OFFICE SEVERAL DAYS AGO BUT DID NOT RECEIVED A RETURN CALL , ALERT AND ORIENTED , SPEECH CLEAR , NO FACIAL ASYMMETRY, AMBULATES STEADILY WITH EQUAL STRONG GRIPS , NO ARM DRIFT . DENIES PAIN . RESPIRATIONS UNLABORED.

## 2011-06-06 NOTE — Discharge Instructions (Signed)
Paresthesia  Paresthesia is an abnormal burning or prickling sensation. This sensation is generally felt in the hands, arms, legs, or feet. However, it may occur in any part of the body. It is usually not painful. The feeling may be described as:  · Tingling or numbness.  · "Pins and needles."  · Skin crawling.  · Buzzing.  · Limbs "falling asleep."  · Itching.  Most people experience temporary (transient) paresthesia at some time in their lives.  CAUSES   Paresthesia may occur when you breathe too quickly (hyperventilation). It can also occur without any apparent cause. Commonly, paresthesia occurs when pressure is placed on a nerve. The feeling quickly goes away once the pressure is removed. For some people, however, paresthesia is a long-lasting (chronic) condition caused by an underlying disorder. The underlying disorder may be:  · A traumatic, direct injury to nerves. Examples include a:  · Broken (fractured) neck.  · Fractured skull.  · A disorder affecting the brain and spinal cord (central nervous system). Examples include:  · Transverse myelitis.  · Encephalitis.  · Transient ischemic attack.  · Multiple sclerosis.  · Stroke.  · Tumor or blood vessel problems, such as an arteriovenous malformation pressing against the brain or spinal cord.  · A condition that damages the peripheral nerves (peripheral neuropathy). Peripheral nerves are not part of the brain and spinal cord. These conditions include:  · Diabetes.  · Peripheral vascular disease.  · Nerve entrapment syndromes, such as carpal tunnel syndrome.  · Shingles.  · Hypothyroidism.  · Vitamin B12 deficiencies.  · Alcoholism.  · Heavy metal poisoning (lead, arsenic).  · Rheumatoid arthritis.  · Systemic lupus erythematosus.  DIAGNOSIS   Your caregiver will attempt to find the underlying cause of your paresthesia. Your caregiver may:  · Take your medical history.  · Perform a physical exam.  · Order various lab tests.  · Order imaging tests.  TREATMENT    Treatment for paresthesia depends on the underlying cause.  HOME CARE INSTRUCTIONS  · Avoid drinking alcohol.  · You may consider massage or acupuncture to help relieve your symptoms.  · Keep all follow-up appointments as directed by your caregiver.  SEEK IMMEDIATE MEDICAL CARE IF:   · You feel weak.  · You have trouble walking or moving.  · You have problems with speech or vision.  · You feel confused.  · You cannot control your bladder or bowel movements.  · You feel numbness after an injury.  · You faint.  · Your burning or prickling feeling gets worse when walking.  · You have pain, cramps, or dizziness.  · You develop a rash.  MAKE SURE YOU:  · Understand these instructions.  · Will watch your condition.  · Will get help right away if you are not doing well or get worse.  Document Released: 02/18/2002 Document Revised: 02/17/2011 Document Reviewed: 11/19/2010  [employer]® Patient Information ©2012 [employer].

## 2011-06-09 ENCOUNTER — Emergency Department (HOSPITAL_COMMUNITY)
Admission: EM | Admit: 2011-06-09 | Discharge: 2011-06-09 | Disposition: A | Discharge status: Home / Self Care | Payer: BC Managed Care – PPO | Attending: Emergency Medicine | Admitting: Emergency Medicine

## 2011-06-09 ENCOUNTER — Other Ambulatory Visit: Payer: Self-pay

## 2011-06-09 DIAGNOSIS — R569 Unspecified convulsions: Secondary | ICD-10-CM

## 2011-06-09 DIAGNOSIS — I10 Essential (primary) hypertension: Secondary | ICD-10-CM | POA: Insufficient documentation

## 2011-06-09 LAB — BASIC METABOLIC PANEL
CO2: 23 mEq/L (ref 19–32)
Calcium: 9.2 mg/dL (ref 8.4–10.5)
Chloride: 100 mEq/L (ref 96–112)
Sodium: 136 mEq/L (ref 135–145)

## 2011-06-09 LAB — CBC
Platelets: 252 10*3/uL (ref 150–400)
RDW: 11.8 % (ref 11.5–15.5)
WBC: 9.4 10*3/uL (ref 4.0–10.5)

## 2011-06-09 LAB — DIFFERENTIAL
Basophils Absolute: 0 10*3/uL (ref 0.0–0.1)
Eosinophils Relative: 0 % (ref 0–5)
Lymphocytes Relative: 11 % — ABNORMAL LOW (ref 12–46)
Neutro Abs: 7.5 10*3/uL (ref 1.7–7.7)
Neutrophils Relative %: 80 % — ABNORMAL HIGH (ref 43–77)

## 2011-06-09 LAB — PHENYTOIN LEVEL, TOTAL: Phenytoin Lvl: 8.2 ug/mL — ABNORMAL LOW (ref 10.0–20.0)

## 2011-06-09 LAB — TROPONIN I: Troponin I: <0.3 ng/mL (ref <0.30)

## 2011-06-09 MED ORDER — LEVETIRACETAM 500 MG PO TABS
1000.0000 mg | ORAL_TABLET | ORAL | Status: AC
  Administered 2011-06-09: 1000 mg via ORAL
  Filled 2011-06-09: qty 2

## 2011-06-09 MED ORDER — LEVETIRACETAM 500 MG PO TABS
500.0000 mg | ORAL_TABLET | Freq: Once | ORAL | Status: DC
  Filled 2011-06-09: qty 1

## 2011-06-09 MED ORDER — LEVETIRACETAM 500 MG PO TABS: 500.0000 mg | ORAL_TABLET | Freq: Two times a day (BID) | ORAL | Status: DC

## 2011-06-09 MED ORDER — PHENYTOIN SODIUM EXTENDED 100 MG PO CAPS
400.0000 mg | ORAL_CAPSULE | Freq: Once | ORAL | Status: AC
  Administered 2011-06-09: 400 mg via ORAL
  Filled 2011-06-09: qty 4

## 2011-06-09 NOTE — ED Notes (Signed)
Seizure pads on bed.pt.dress in gown the patient.onmonitor

## 2011-06-09 NOTE — ED Notes (Signed)
Per ems- pt coming from home where he had seizure that lasted approx 4-5 minutes.  Pt had a craniotomy two weeks ago secondary to a fall that was due to a syncopal episode.  Pt had a ct scan here on Saturday that he was told he did not have any active bleeding and was told the shunt was working.  Pt had a follow up appointment yesterday and was told his dilantin levels low (pt states his doctor told him around 8).

## 2011-06-09 NOTE — ED Notes (Signed)
Pt states he was working on his taxes and then the next thing he remembers was ems arriving.

## 2011-06-09 NOTE — Discharge Instructions (Signed)
Take 500 mg of Keppra twice a day. Take 200 mg of Dilantin in the morning and 300 mg of Dilantin at night. Follow up with Dr. love this week. Return to the ED if you develop new or worsening symptoms. Epilepsy A seizure (convulsion) is a sudden change in brain function that causes a change in behavior, muscle activity, or ability to remain awake and alert. If a person has recurring seizures, this is called epilepsy. CAUSES  Epilepsy is a disorder with many possible causes. Anything that disturbs the normal pattern of brain cell activity can lead to seizures. Seizure can be caused from illness to brain damage to abnormal brain development. Epilepsy may develop because of:  An abnormality in brain wiring.   An imbalance of nerve signaling chemicals (neurotransmitters).   Some combination of these factors.  Scientists are learning an increasing amount about genetic causes of seizures. SYMPTOMS  The symptoms of a seizure can vary greatly from one person to another. These may include:  An aura, or warning that tells a person they are about to have a seizure.   Abnormal sensations, such as abnormal smell or seeing flashing lights.   Sudden, general body stiffness.   Rhythmic jerking of the face, arm, or leg - on one or both sides.   Sudden change in consciousness.   The person may appear to be awake but not responding.   They may appear to be asleep but cannot be awakened.   Grimacing, chewing, lip smacking, or drooling.   Often there is a period of sleepiness after a seizure.  DIAGNOSIS  The description you give to your caregiver about what you experienced will help them understand your problems. Equally important is the description by any witnesses to your seizure. A physical exam, including a detailed neurological exam, is necessary. An EEG (electroencephalogram) is a painless test of your brain waves. In this test a diagram is created of your brain waves. These diagrams can be  interpreted by a specialist. Pictures of your brain are usually taken with:  An MRI.   A CT scan.  Lab tests may be done to look for:  Signs of infection.   Abnormal blood chemistry.  PREVENTION  There is no way to prevent the development of epilepsy. If you have seizures that are typically triggered by an event (such as flashing lights), try to avoid the trigger. This can help you avoid a seizure.  PROGNOSIS  Most people with epilepsy lead outwardly normal lives. While epilepsy cannot currently be cured, for some people it does eventually go away. Most seizures do not cause brain damage. It is not uncommon for people with epilepsy, especially children, to develop behavioral and emotional problems. These problems are sometimes the consequence of medicine for seizures or social stress. For some people with epilepsy, the risk of seizures restricts their independence and recreational activities. For example, some states refuse drivers licenses to people with epilepsy. Most women with epilepsy can become pregnant. They should discuss their epilepsy and the medicine they are taking with their caregivers. Women with epilepsy have a 90 percent or better chance of having a normal, healthy baby. RISKS AND COMPLICATIONS  People with epilepsy are at increased risk of falls, accidents, and injuries. People with epilepsy are at special risk for two life-threatening conditions. These are status epilepticus and sudden unexplained death (extremely rare). Status epilepticus is a long lasting, continuous seizure that is a medical emergency. TREATMENT  Once epilepsy is diagnosed, it is important  to begin treatment as soon as possible. For about 80 percent of those diagnosed with epilepsy, seizures can be controlled with modern medicines and surgical techniques. Some antiepileptic drugs can interfere with the effectiveness of oral contraceptives. In 1997, the FDA approved a pacemaker for the brain the (vagus nerve  stimulator). This stimulator can be used for people with seizures that are not well-controlled by medicine. Studies have shown that in some cases, children may experience fewer seizures if they maintain a strict diet. The strict diet is called the ketogenic diet. This diet is rich in fats and low in carbohydrates. HOME CARE INSTRUCTIONS   Your caregiver will make recommendations about driving and safety in normal activities. Follow these carefully.   Take any medicine prescribed exactly as directed.   Do any blood tests requested to monitor the levels of your medicine.   The people you live and work with should know that you are prone to seizures. They should receive instructions on how to help you. In general, a witness to a seizure should:   Cushion your head and body.   Turn you on your side.   Avoid unnecessarily restraining you.   Not place anything inside your mouth.   Call for local emergency medical help if there is any question about what has occurred.   Keep a seizure diary. Record what you recall about any seizure, especially any possible trigger.   If your caregiver has given you a follow-up appointment, it is very important to keep that appointment. Not keeping the appointment could result in permanent injury and disability. If there is any problem keeping the appointment, you must call back to this facility for assistance.  SEEK MEDICAL CARE IF:   You develop signs of infection or other illness. This might increase the risk of a seizure.   You seem to be having more frequent seizures.   Your seizure pattern is changing.  SEEK IMMEDIATE MEDICAL CARE IF:   A seizure does not stop after a few moments.   A seizure causes any difficulty in breathing.   A seizure results in a very severe headache.   A seizure leaves you with the inability to speak or use a part of your body.  MAKE SURE YOU:   Understand these instructions.   Will watch your condition.   Will get  help right away if you are not doing well or get worse.  Document Released: 02/28/2005 Document Revised: 02/17/2011 Document Reviewed: 10/05/2007 [facility] Patient Information 2012 [place], [place].

## 2011-06-09 NOTE — ED Provider Notes (Signed)
History     CSN: [id number]  Arrival date & time 06/09/11  1239   First MD Initiated Contact with Patient 06/09/11 1245      Chief Complaint  Patient presents with  . Seizures    (Consider location/radiation/quality/duration/timing/severity/associated sxs/prior treatment) HPI Comments: Patient presents from home with generalized tonic-clonic seizure activity lasting approximately 4 minutes per his son. Patient is 2 weeks status post craniotomy for subdural hematoma by Dr. Newell Coral.  He has not had a seizure in 10 years. He is taking Dilantin and saw Dr. love yesterday and got a call today and was informed that level is low. He states compliance with his Dilantin. 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 He did have tongue biting. EMS reports that his left arm was initially flaccid but has since regained strength. denies any bowel or bladder incontinence. Denies any chest pain, shortness of breath, fever chills. He states he feels completely back to normal. Denies any headache, vision change abdominal pain, back pain.  The history is provided by the patient and the EMS personnel.    Past Medical History  Diagnosis Date  . Seizure disorder     Dx remotely, last SZ activity aprox 1998  . Back pain     HNP dx ~2008, no surgery  . HTN (hypertension)   . Hearing loss     Rt ear sensorineural hearing loss, with hearing aide. W/u by Dr. Molli Barrows extensively negative    Past Surgical History  Procedure Date  . Hernia repair     as an infant  . Craniotomy 05/27/2011    Procedure: CRANIOTOMY HEMATOMA EVACUATION SUBDURAL;  Surgeon: Hewitt Shorts, MD;  Location: [facility];  Service: Neurosurgery;  Laterality: Right;  Evacuation of Subdural Hematoma    Family History  Problem Relation Age of Onset  . Emphysema Mother     smoker  . Hypertension Father     F and B   . Leukemia Sister   . Diabetes Neg Hx   . Prostate cancer Brother 20  .  Colon cancer Neg Hx   . Coronary artery disease Neg Hx     History  Substance Use Topics  . Smoking status: Never Smoker   . Smokeless tobacco: Never Used  . Alcohol Use: Yes     occasionally      Review of Systems  Constitutional: Positive for activity change. Negative for fever.  HENT: Negative for congestion and rhinorrhea.   Eyes: Negative for visual disturbance.  Respiratory: Negative for cough and shortness of breath.   Cardiovascular: Negative for chest pain.  Gastrointestinal: Negative for nausea, vomiting and abdominal pain.  Genitourinary: Negative for dysuria.  Musculoskeletal: Negative for back pain.  Skin: Negative for rash.  Neurological: Positive for seizures. Negative for light-headedness and headaches.    Allergies  Review of patient's allergies indicates no known allergies.  Home Medications   Current Outpatient Rx  Name Route Sig Dispense Refill  . AMLODIPINE-OLMESARTAN 10-40 MG PO TABS Oral Take 1 tablet by mouth daily.    Marland Kitchen FLUTICASONE PROPIONATE 50 MCG/ACT NA SUSP Nasal Place 2 sprays into the nose daily.    . FUROSEMIDE 40 MG PO TABS Oral Take 20 mg by mouth 2 (two) times daily.    Marland Kitchen HYDROCODONE-ACETAMINOPHEN 5-325 MG PO TABS Oral Take 1-2 tablets by mouth every 4 (four) hours as needed. For pain    . ADULT MULTIVITAMIN W/MINERALS CH Oral Take 1  tablet by mouth daily.    Marland Kitchen PHENYTOIN SODIUM EXTENDED 100 MG PO CAPS Oral Take 200 mg by mouth 2 (two) times daily.     Marland Kitchen LEVETIRACETAM 500 MG PO TABS Oral Take 1 tablet (500 mg total) by mouth every 12 (twelve) hours. 60 tablet 0    BP 126/66  Pulse 72  Temp(Src) 98.3 F (36.8 C) (Oral)  Resp 18  SpO2 100%  Physical Exam  Constitutional: He is oriented to person, place, and time. He appears well-developed and well-nourished. No distress.  HENT:  Head: Normocephalic and atraumatic.  Mouth/Throat: Oropharynx is clear and moist.       Well-healed right-sided craniotomy incision  Eyes: Conjunctivae  are normal. Pupils are equal, round, and reactive to light.  Neck: Normal range of motion. Neck supple.       No meningismus  Cardiovascular: Normal rate, regular rhythm and normal heart sounds.   Pulmonary/Chest: No respiratory distress.  Abdominal: Soft. There is no tenderness. There is no rebound and no guarding.  Musculoskeletal: He exhibits no edema and no tenderness.  Neurological: He is alert and oriented to person, place, and time. No cranial nerve deficit.       5 out of 5 strength throughout, no focal deficits, no facial droop, no ataxia finger to nose.  Skin: Skin is warm.    ED Course  Procedures (including critical care time)  Labs Reviewed  DIFFERENTIAL - Abnormal; Notable for the following:    Neutrophils Relative 80 (*)    Lymphocytes Relative 11 (*)    All other components within normal limits  BASIC METABOLIC PANEL - Abnormal; Notable for the following:    Glucose, Bld 147 (*)    GFR calc non Af Amer 89 (*)    All other components within normal limits  PHENYTOIN LEVEL, TOTAL - Abnormal; Notable for the following:    Phenytoin Lvl 8.2 (*)    All other components within normal limits  TROPONIN I  CBC   No results found.   1. Seizure       MDM  Seizure with history of seizure disorder and recent craniotomy. Back to baseline now. Transient left upper extremity weakness has resolved. Nonfocal neuro exam at this time.   Will check CT head, labs, Dilantin level, discuss with neurology.  Patient and family members refusing head CT. His left upper extremity weakness has resolved. This is consistent with a todd's paralysis.  I discussed the patient's presentation with Dr. love. He would like to give him an additional 400 mg of Dilantin now. He also start him on keppa 1 g now then 500 twice a day.  Dr. Sandria Manly is ok with patient not receiving CT as his LUE weakness has resolved and consistent with a Todd's paralysis.   Normal neuro exam upon discharge.   Date:  06/09/2011  Rate: 78  Rhythm: normal sinus rhythm  QRS Axis: normal  Intervals: normal  ST/T Wave abnormalities: normal  Conduction Disutrbances:none  Narrative Interpretation:   Old EKG Reviewed: unchanged     Glynn Octave, MD 06/09/11 1515

## 2011-06-21 ENCOUNTER — Other Ambulatory Visit (HOSPITAL_COMMUNITY): Payer: Self-pay | Admitting: Neurosurgery

## 2011-06-21 DIAGNOSIS — S065X9A Traumatic subdural hemorrhage with loss of consciousness of unspecified duration, initial encounter: Secondary | ICD-10-CM

## 2011-06-27 ENCOUNTER — Ambulatory Visit (HOSPITAL_COMMUNITY)
Admission: RE | Admit: 2011-06-27 | Discharge: 2011-06-27 | Disposition: A | Discharge status: Home / Self Care | Payer: BC Managed Care – PPO | Source: Ambulatory Visit | Attending: Neurosurgery | Admitting: Neurosurgery

## 2011-06-27 DIAGNOSIS — S065X9A Traumatic subdural hemorrhage with loss of consciousness of unspecified duration, initial encounter: Secondary | ICD-10-CM

## 2011-06-27 DIAGNOSIS — X58XXXA Exposure to other specified factors, initial encounter: Secondary | ICD-10-CM | POA: Insufficient documentation

## 2011-06-27 DIAGNOSIS — S065X0A Traumatic subdural hemorrhage without loss of consciousness, initial encounter: Secondary | ICD-10-CM | POA: Insufficient documentation

## 2011-06-28 ENCOUNTER — Other Ambulatory Visit (HOSPITAL_COMMUNITY): Payer: Self-pay | Admitting: Neurosurgery

## 2011-06-28 DIAGNOSIS — S065X9A Traumatic subdural hemorrhage with loss of consciousness of unspecified duration, initial encounter: Secondary | ICD-10-CM

## 2011-07-25 ENCOUNTER — Ambulatory Visit (HOSPITAL_COMMUNITY)
Admission: RE | Admit: 2011-07-25 | Discharge: 2011-07-25 | Disposition: A | Discharge status: Home / Self Care | Payer: BC Managed Care – PPO | Source: Ambulatory Visit | Attending: Neurosurgery | Admitting: Neurosurgery

## 2011-07-25 DIAGNOSIS — S065X9A Traumatic subdural hemorrhage with loss of consciousness of unspecified duration, initial encounter: Secondary | ICD-10-CM

## 2011-07-25 DIAGNOSIS — R930 Abnormal findings on diagnostic imaging of skull and head, not elsewhere classified: Secondary | ICD-10-CM | POA: Insufficient documentation

## 2011-07-25 DIAGNOSIS — I6200 Nontraumatic subdural hemorrhage, unspecified: Secondary | ICD-10-CM | POA: Insufficient documentation

## 2011-08-24 ENCOUNTER — Other Ambulatory Visit (HOSPITAL_COMMUNITY): Payer: Self-pay | Admitting: Neurosurgery

## 2011-08-24 DIAGNOSIS — S065X9A Traumatic subdural hemorrhage with loss of consciousness of unspecified duration, initial encounter: Secondary | ICD-10-CM

## 2011-09-12 ENCOUNTER — Ambulatory Visit (HOSPITAL_COMMUNITY)
Admission: RE | Admit: 2011-09-12 | Discharge: 2011-09-12 | Disposition: A | Discharge status: Home / Self Care | Payer: BC Managed Care – PPO | Source: Ambulatory Visit | Attending: Neurosurgery | Admitting: Neurosurgery

## 2011-09-12 DIAGNOSIS — S065XAA Traumatic subdural hemorrhage with loss of consciousness status unknown, initial encounter: Secondary | ICD-10-CM | POA: Insufficient documentation

## 2011-09-12 DIAGNOSIS — X58XXXA Exposure to other specified factors, initial encounter: Secondary | ICD-10-CM | POA: Insufficient documentation

## 2011-09-12 DIAGNOSIS — S065X9A Traumatic subdural hemorrhage with loss of consciousness of unspecified duration, initial encounter: Secondary | ICD-10-CM

## 2011-11-13 HISTORY — PX: EYE SURGERY: SHX253

## 2011-12-05 ENCOUNTER — Telehealth: Payer: Self-pay | Admitting: Internal Medicine

## 2011-12-05 MED ORDER — AMLODIPINE-OLMESARTAN 10-40 MG PO TABS: 1.0000 | ORAL_TABLET | Freq: Every day | ORAL | Status: DC

## 2011-12-05 NOTE — Telephone Encounter (Signed)
Refill done.  

## 2011-12-05 NOTE — Telephone Encounter (Signed)
Refill: Azor 10-40 mg tablet. Take 1 tablet by mouth once daily. Qty 30. Last fill 8.19.13

## 2011-12-06 ENCOUNTER — Other Ambulatory Visit: Payer: Self-pay | Admitting: Internal Medicine

## 2011-12-06 NOTE — Telephone Encounter (Signed)
Pharmacy confirmed pt still has refills left on file. Refill request sent in error.

## 2012-04-03 ENCOUNTER — Ambulatory Visit (INDEPENDENT_AMBULATORY_CARE_PROVIDER_SITE_OTHER): Payer: Medicare Other | Admitting: Internal Medicine

## 2012-04-03 ENCOUNTER — Encounter: Payer: Self-pay | Admitting: Internal Medicine

## 2012-04-03 VITALS — BP 126/72 | HR 75 | Temp 98.0°F | Ht 72.0 in | Wt 196.0 lb

## 2012-04-03 DIAGNOSIS — Z0000 Encounter for general adult medical examination without abnormal findings: Secondary | ICD-10-CM

## 2012-04-03 DIAGNOSIS — R569 Unspecified convulsions: Secondary | ICD-10-CM

## 2012-04-03 DIAGNOSIS — I10 Essential (primary) hypertension: Secondary | ICD-10-CM

## 2012-04-03 DIAGNOSIS — Z23 Encounter for immunization: Secondary | ICD-10-CM

## 2012-04-03 MED ORDER — ZOSTER VACCINE LIVE 19400 UNT/0.65ML ~~LOC~~ SOLR: 0.6500 mL | Freq: Once | SUBCUTANEOUS | Status: DC

## 2012-04-03 MED ORDER — AMLODIPINE-OLMESARTAN 10-40 MG PO TABS: 1.0000 | ORAL_TABLET | Freq: Every day | ORAL | Status: DC

## 2012-04-03 NOTE — Progress Notes (Signed)
Subjective:    Patient ID: Joel [name], male    DOB: 1945/10/05, 67 y.o.   MRN: [id number]  HPI Here for Medicare AWV: 1. Risk factors based on Past M, S, F history: reviewed 2. Physical Activities: exercises x 5/weel, gym, spin class  3. Depression/mood:  (-) screening  4. Hearing: R hearing loss, has a hearing aid   5. ADL's: independent , drives    6. Fall Risk: see instructions  7. home Safety: does feelsafe at home  8. Height, weight, &visual acuity: see VS, had a L retina detachment, also planning cataract surgery, sees eye doctors ([facility], [place])  9. Counseling: provided 10. Labs ordered based on risk factors: if needed  11. Referral Coordination: if needed 12.  Care Plan, see assessment and plan  13.   Cognitive Assessment: Motor skills and cognition intact  In addition, today we discussed the following: Hypertension, good medication compliance, no apparent side effects. Ambulatory BP 126/70 on average. History of seizures , good medication compliance, no recent seizure events.  Past Medical History  Diagnosis Date  . Seizure disorder     Dx remotely, last SZ activity aprox 1998  . Back pain     HNP dx ~2008, no surgery  . HTN (hypertension)   . Hearing loss     Rt ear sensorineural hearing loss, with hearing aide. W/u by Dr. Molli Barrows extensively negative  . Fall 05-2011     had a IC bleed    Past Surgical History  Procedure Date  . Hernia repair     as an infant  . Craniotomy 05/27/2011    Procedure: CRANIOTOMY HEMATOMA EVACUATION SUBDURAL;  Surgeon: Hewitt Shorts, MD;  Location: [facility];  Service: Neurosurgery;  Laterality: Right;  Evacuation of Subdural Hematoma  . Eye surgery 11-2011     for retinal detachment , L, Dr Luciana Axe   History   Social History  . Marital Status: Single    Spouse Name: N/A    Number of Children: 2  . Years of Education: N/A   Occupational History  . Retired: Prof [employer]    Social History Main Topics  . Smoking  status: Never Smoker   . Smokeless tobacco: Never Used  . Alcohol Use: No     Comment: not in > 1 year  . Drug Use: No  . Sexually Active: Not on file   Other Topics Concern  . Not on file   Social History Narrative   2 children, one in [place] another in [place] -----Diet: very healthy ----    Family History  Problem Relation Age of Onset  . Emphysema Mother     smoker  . Hypertension Father     F and B   . Leukemia Sister   . Diabetes Neg Hx   . Prostate cancer Brother 50  . Colon cancer Neg Hx   . Coronary artery disease Neg Hx     Review of Systems  Respiratory: Negative for shortness of breath.   Cardiovascular: Negative for chest pain, palpitations and leg swelling.  Gastrointestinal: Negative for nausea, diarrhea and blood in stool.  Genitourinary: Negative for hematuria and difficulty urinating.       Objective:   Physical Exam General -- alert, well-developed, and well-nourished.   Neck --no thyromegaly , normal carotid pulse Lungs -- normal respiratory effort, no intercostal retractions, no accessory muscle use, and normal breath sounds.   Heart-- normal rate, regular rhythm, no murmur, and no gallop.  Abdomen--soft, non-tender, no distention, no masses, no HSM, no guarding, and no rigidity.   Extremities-- no pretibial edema bilaterally Rectal-- No external abnormalities noted. Normal sphincter tone. No rectal masses or tenderness. Brown stool, Hemoccult trace + Prostate:  Prostate gland firm and smooth, no enlargement, nodularity, tenderness, mass, asymmetry or induration. Neurologic-- alert & oriented X3 and strength normal in all extremities. Psych-- Cognition and judgment appear intact. Alert and cooperative with normal attention span and concentration.  not anxious appearing and not depressed appearing.      Assessment & Plan:

## 2012-04-03 NOTE — Assessment & Plan Note (Signed)
Seems to be well-controlled, labs and  RF meds

## 2012-04-03 NOTE — Assessment & Plan Note (Signed)
Developed a seizure disorder after a subarachnoid  hemorrhag, well-controlled on current medications, to see neurology soon

## 2012-04-03 NOTE — Assessment & Plan Note (Addendum)
Td 09 Had a flu shot Pneumonia shot-- today zostavax-- benefits discussed, rx provided   + FH  prostate cancer, he is asymptomatic, rectal exam negative, check a PSA. Last colonoscopy 2003, was recommended to have another colonoscopy in 5 to 10 years. He is asymptomatic but has a trace positive Hemoccult today. Refer to Dr. Luther Parody consideration of a colonoscopy.  Continue with healthy lifestyle !

## 2012-04-03 NOTE — Patient Instructions (Addendum)
Please come back fasting: BMP, CBC, FLP --- dx HTN PSA --- dx  + family history of prostate cancer, screening. ------ Check the  blood pressure 2 or 3 times a week, be sure it is between 110/60 and 140/85. If it is consistently higher or lower, let me know Next visit in one year for another physical as long as you feel well and the blood pressure is well-controlled. Be sure you see the GI specialist -----   Fall Prevention and Home Safety Falls cause injuries and can affect all age groups. It is possible to use preventive measures to significantly decrease the likelihood of falls. There are many simple measures which can make your home safer and prevent falls. OUTDOORS  Repair cracks and edges of walkways and driveways.  Remove high doorway thresholds.  Trim shrubbery on the main path into your home.  Have good outside lighting.  Clear walkways of tools, rocks, debris, and clutter.  Check that handrails are not broken and are securely fastened. Both sides of steps should have handrails.  Have leaves, snow, and ice cleared regularly.  Use sand or salt on walkways during winter months.  In the garage, clean up grease or oil spills. BATHROOM  Install night lights.  Install grab bars by the toilet and in the tub and shower.  Use non-skid mats or decals in the tub or shower.  Place a plastic non-slip stool in the shower to sit on, if needed.  Keep floors dry and clean up all water on the floor immediately.  Remove soap buildup in the tub or shower on a regular basis.  Secure bath mats with non-slip, double-sided rug tape.  Remove throw rugs and tripping hazards from the floors. BEDROOMS  Install night lights.  Make sure a bedside light is easy to reach.  Do not use oversized bedding.  Keep a telephone by your bedside.  Have a firm chair with side arms to use for getting dressed.  Remove throw rugs and tripping hazards from the floor. KITCHEN  Keep handles on  pots and pans turned toward the center of the stove. Use back burners when possible.  Clean up spills quickly and allow time for drying.  Avoid walking on wet floors.  Avoid hot utensils and knives.  Position shelves so they are not too high or low.  Place commonly used objects within easy reach.  If necessary, use a sturdy step stool with a grab bar when reaching.  Keep electrical cables out of the way.  Do not use floor polish or wax that makes floors slippery. If you must use wax, use non-skid floor wax.  Remove throw rugs and tripping hazards from the floor. STAIRWAYS  Never leave objects on stairs.  Place handrails on both sides of stairways and use them. Fix any loose handrails. Make sure handrails on both sides of the stairways are as long as the stairs.  Check carpeting to make sure it is firmly attached along stairs. Make repairs to worn or loose carpet promptly.  Avoid placing throw rugs at the top or bottom of stairways, or properly secure the rug with carpet tape to prevent slippage. Get rid of throw rugs, if possible.  Have an electrician put in a light switch at the top and bottom of the stairs. OTHER FALL PREVENTION TIPS  Wear low-heel or rubber-soled shoes that are supportive and fit well. Wear closed toe shoes.  When using a stepladder, make sure it is fully opened and both spreaders  are firmly locked. Do not climb a closed stepladder.  Add color or contrast paint or tape to grab bars and handrails in your home. Place contrasting color strips on first and last steps.  Learn and use mobility aids as needed. Install an electrical emergency response system.  Turn on lights to avoid dark areas. Replace light bulbs that burn out immediately. Get light switches that glow.  Arrange furniture to create clear pathways. Keep furniture in the same place.  Firmly attach carpet with non-skid or double-sided tape.  Eliminate uneven floor surfaces.  Select a carpet  pattern that does not visually hide the edge of steps.  Be aware of all pets. OTHER HOME SAFETY TIPS  Set the water temperature for 120 F (48.8 C).  Keep emergency numbers on or near the telephone.  Keep smoke detectors on every level of the home and near sleeping areas. Document Released: 02/18/2002 Document Revised: 08/30/2011 Document Reviewed: 05/20/2011 [facility] Patient Information 2013 [place], [place].

## 2012-04-05 ENCOUNTER — Other Ambulatory Visit (INDEPENDENT_AMBULATORY_CARE_PROVIDER_SITE_OTHER): Payer: Medicare Other

## 2012-04-05 ENCOUNTER — Encounter: Payer: Self-pay | Admitting: Internal Medicine

## 2012-04-05 DIAGNOSIS — I10 Essential (primary) hypertension: Secondary | ICD-10-CM

## 2012-04-05 DIAGNOSIS — Z125 Encounter for screening for malignant neoplasm of prostate: Secondary | ICD-10-CM

## 2012-04-05 LAB — CBC WITH DIFFERENTIAL/PLATELET
Basophils Absolute: 0 10*3/uL (ref 0.0–0.1)
Basophils Relative: 0.4 % (ref 0.0–3.0)
Eosinophils Absolute: 0 10*3/uL (ref 0.0–0.7)
Eosinophils Relative: 0.7 % (ref 0.0–5.0)
HCT: 42.1 % (ref 39.0–52.0)
Hemoglobin: 14 g/dL (ref 13.0–17.0)
Lymphocytes Relative: 22.1 % (ref 12.0–46.0)
Lymphs Abs: 1.1 10*3/uL (ref 0.7–4.0)
MCHC: 33.3 g/dL (ref 30.0–36.0)
MCV: 97.6 fl (ref 78.0–100.0)
Monocytes Absolute: 0.5 10*3/uL (ref 0.1–1.0)
Monocytes Relative: 10.7 % (ref 3.0–12.0)
Neutro Abs: 3.2 10*3/uL (ref 1.4–7.7)
Neutrophils Relative %: 66.1 % (ref 43.0–77.0)
Platelets: 157 10*3/uL (ref 150.0–400.0)
RBC: 4.32 Mil/uL (ref 4.22–5.81)
RDW: 12.9 % (ref 11.5–14.6)
WBC: 4.8 10*3/uL (ref 4.5–10.5)

## 2012-04-05 LAB — BASIC METABOLIC PANEL
CO2: 28 mEq/L (ref 19–32)
Calcium: 9.2 mg/dL (ref 8.4–10.5)
GFR: 99.88 mL/min (ref >60.00)
Sodium: 139 mEq/L (ref 135–145)

## 2012-04-05 LAB — LDL CHOLESTEROL, DIRECT: Direct LDL: 109.4 mg/dL

## 2012-04-05 LAB — LIPID PANEL
Cholesterol: 204 mg/dL — ABNORMAL HIGH (ref 0–200)
Triglycerides: 77 mg/dL (ref 0.0–149.0)

## 2012-04-10 ENCOUNTER — Encounter: Payer: Self-pay | Admitting: *Deleted

## 2012-04-23 ENCOUNTER — Other Ambulatory Visit: Payer: Self-pay | Admitting: Internal Medicine

## 2012-04-23 NOTE — Telephone Encounter (Signed)
Refill done.  

## 2012-04-24 ENCOUNTER — Other Ambulatory Visit: Payer: Self-pay | Admitting: Gastroenterology

## 2012-04-28 ENCOUNTER — Other Ambulatory Visit: Payer: Self-pay

## 2012-06-20 ENCOUNTER — Other Ambulatory Visit: Payer: Self-pay

## 2012-06-20 MED ORDER — PHENYTOIN SODIUM EXTENDED 100 MG PO CAPS: 200.0000 mg | ORAL_CAPSULE | Freq: Two times a day (BID) | ORAL | Status: DC

## 2012-06-20 MED ORDER — LEVETIRACETAM ER 750 MG PO TB24: 750.0000 mg | ORAL_TABLET | Freq: Two times a day (BID) | ORAL | Status: DC

## 2012-06-20 NOTE — Telephone Encounter (Signed)
Former Dr Sandria Manly Patient.  Auth refills via [facility] Dr Terrace Arabia

## 2012-06-26 ENCOUNTER — Telehealth: Payer: Self-pay

## 2012-06-26 MED ORDER — PHENYTOIN SODIUM EXTENDED 100 MG PO CAPS: 200.0000 mg | ORAL_CAPSULE | Freq: Two times a day (BID) | ORAL | Status: DC

## 2012-06-26 NOTE — Telephone Encounter (Signed)
Patient called requesting we resend the Dilantin rx for BMN, as this is what he was taking previously.  I have resent rx.

## 2012-09-11 IMAGING — CT CT HEAD W/O CM
2 series · 16 of 30 positions shown, 20 images · non-contrast
Comparison: 06/06/2011

CLINICAL DATA: Follow-up subdural hematoma

CT HEAD WITHOUT CONTRAST
TECHNIQUE: Contiguous axial images were obtained from the base of
the skull through the vertex without contrast.

[Series 2: head w/o · axial · non-contrast · 0.49mm/px · z∈[+110,+260]mm · 13 of 36 slices shown, 17 images]
[im 3/36  brain]
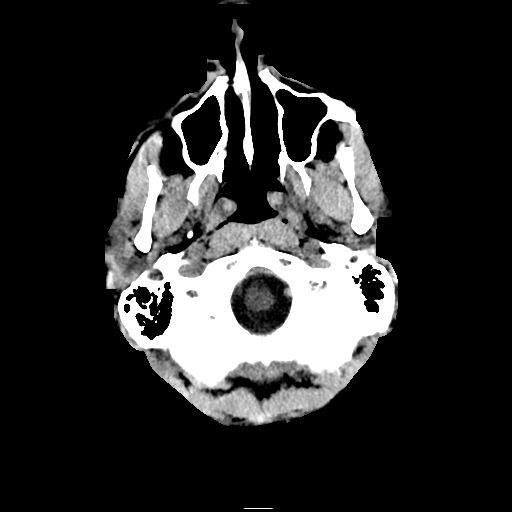
[im 3/36  bone]
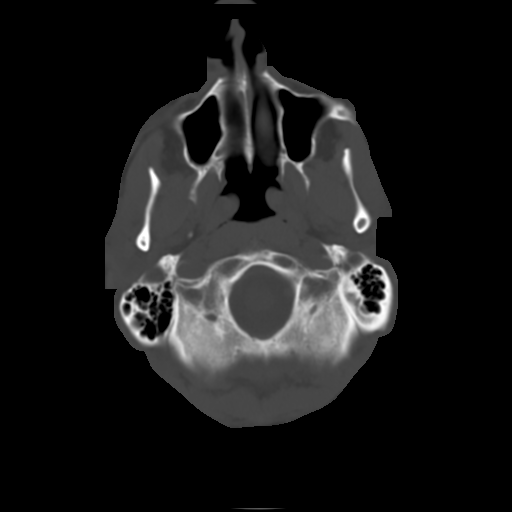
[im 6/36  brain]
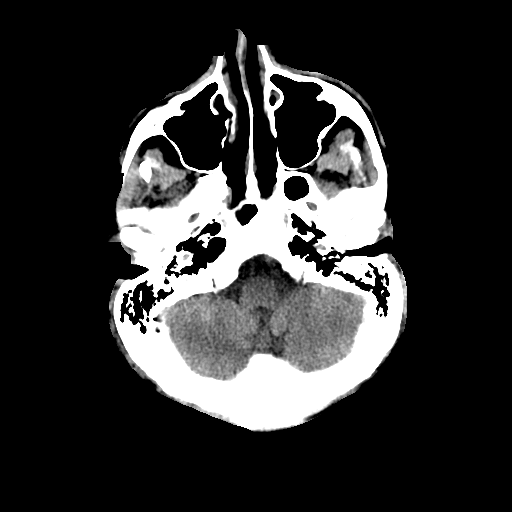
[im 8/36  brain]
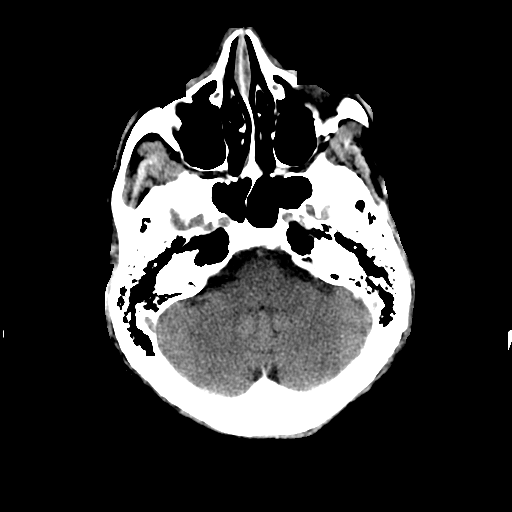
[im 11/36  brain]
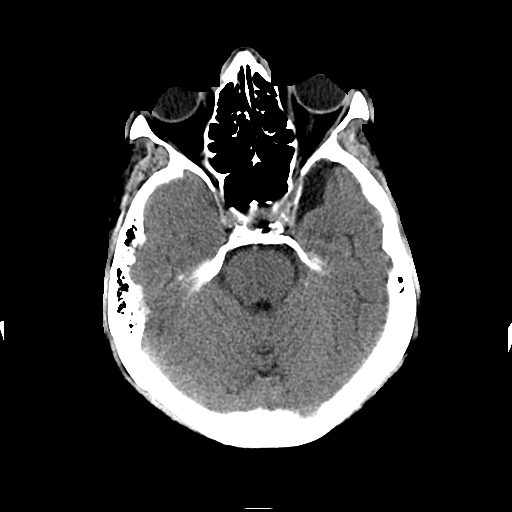
[im 13/36  brain]
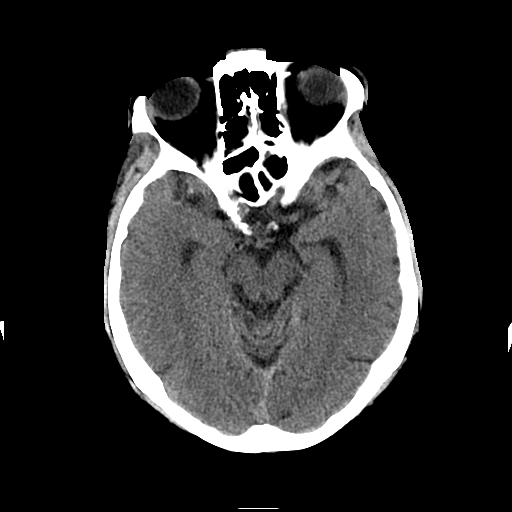
[im 13/36  bone]
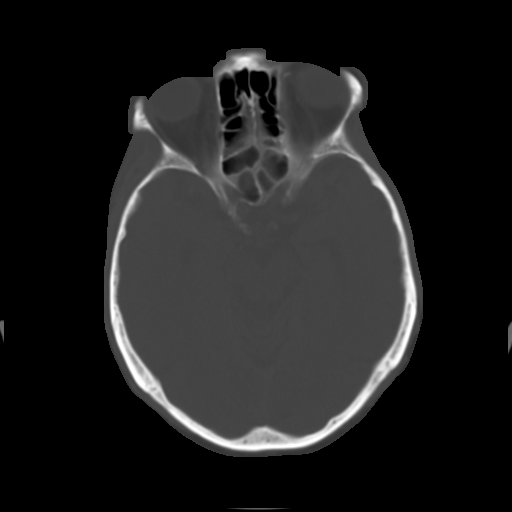
[im 16/36  brain]
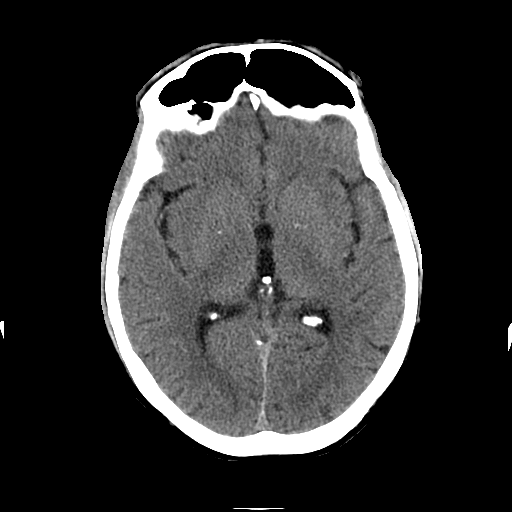
[im 18/36  brain]
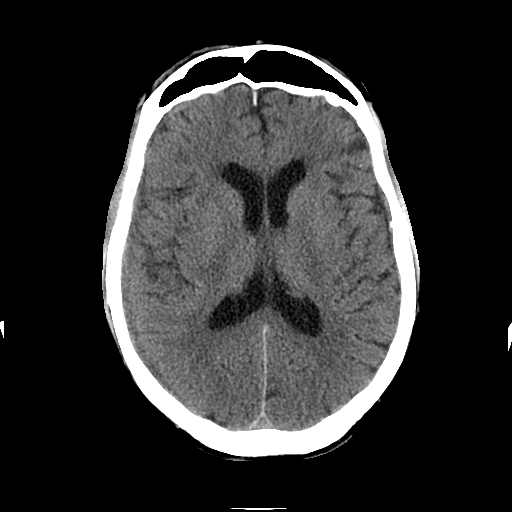
[im 21/36  brain]
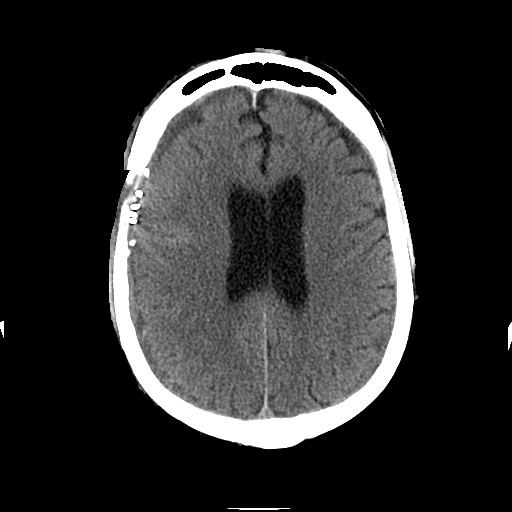
[im 23/36  brain]
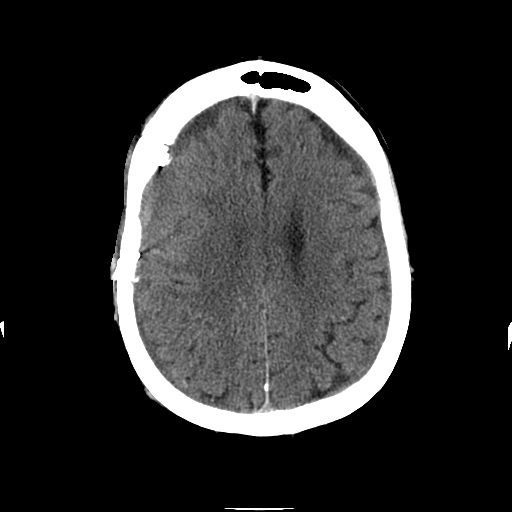
[im 23/36  bone]
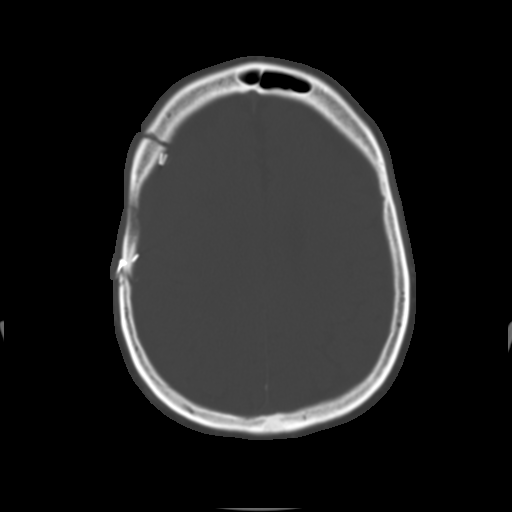
[im 26/36  brain]
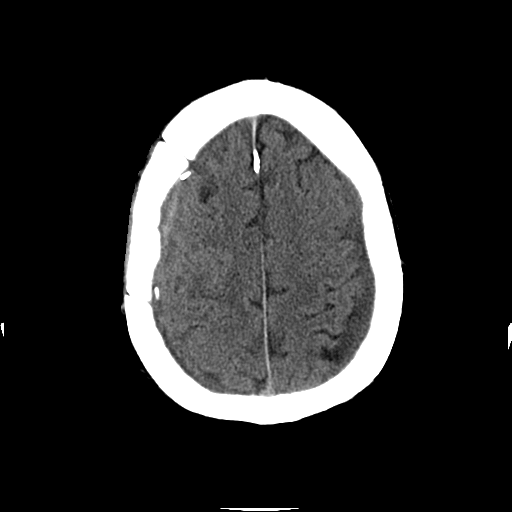
[im 28/36  brain]
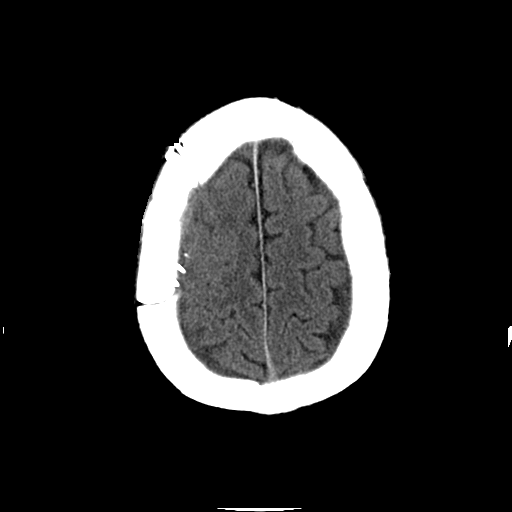
[im 31/36  brain]
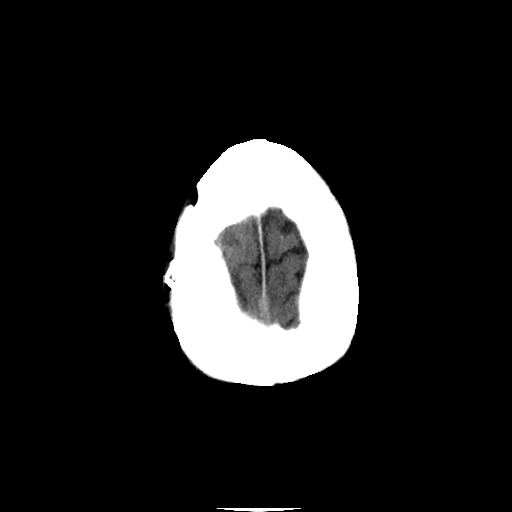
[im 33/36  brain]
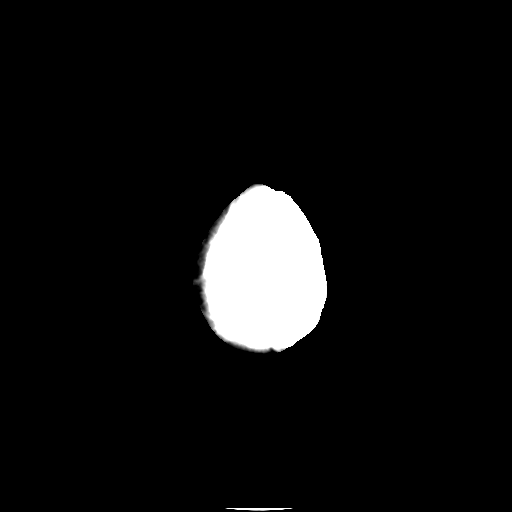
[im 33/36  bone]
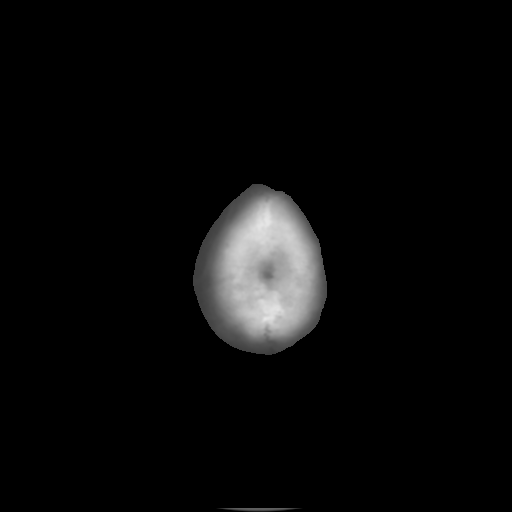

[Series 3: head w/o bone · axial · non-contrast · 0.49mm/px · z∈[+110,+160]mm · 3 of 36 slices shown]
[im 3/36  bone]
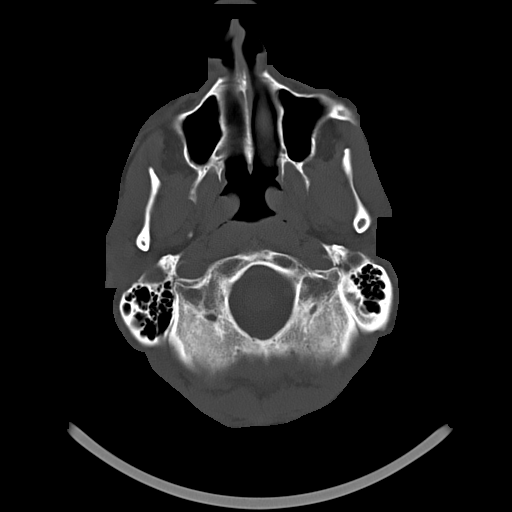
[im 8/36  bone]
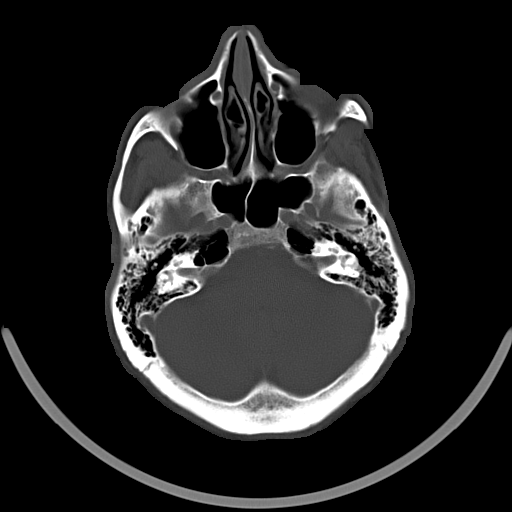
[im 13/36  bone]
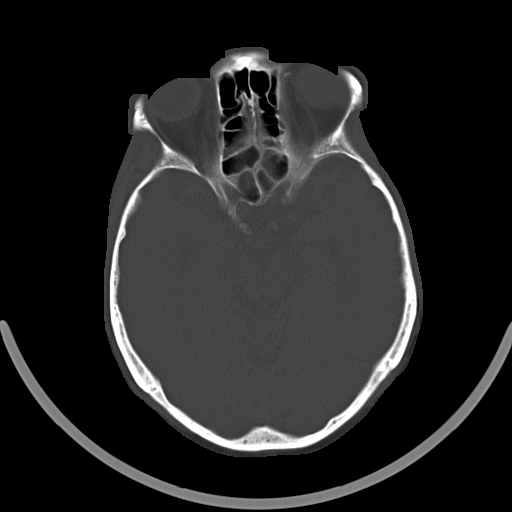

[16 of 30 positions shown; findings below may reference images not displayed]

FINDINGS: Bilateral subdural collections continue to get smaller
and showed decreasing density.  Previous measured maximal thickness
in the right frontal region at 11 mm today measures 9 mm.
Previously measured maximal thickness of the left convexity near
the vertex was 10 mm, today measures 4 mm.  No increased bleeding.
No significant mass effect or shift.  No sign of ischemic
infarction.  No hydrocephalus.  No mass lesion.  Craniotomy
findings as expected.  Sinuses are clear.
IMPRESSION: Diminishing size and density of bilateral subdural collections.  No
adverse findings.

## 2013-01-17 ENCOUNTER — Other Ambulatory Visit: Payer: Self-pay

## 2013-03-26 ENCOUNTER — Other Ambulatory Visit: Payer: Self-pay | Admitting: Internal Medicine

## 2013-04-03 ENCOUNTER — Telehealth: Payer: Self-pay

## 2013-04-03 NOTE — Telephone Encounter (Addendum)
Medication List and allergies:  Reviewed and updated  90 day supply/mail order: na Local prescriptions: [facility] on [address]  Immunizations due: UTD  A/P:   Updated FH, PSH and Personal Hx Flu vaccine--01/2013 Tdap--05/2007 PNA--03/2012 Shingles--04/2012 CCS--04/2012--recommendations 3-5 years PSA--03/2012--0.89  To Discuss with Provider: Not at this time

## 2013-04-05 ENCOUNTER — Encounter: Payer: Self-pay | Admitting: Internal Medicine

## 2013-04-05 ENCOUNTER — Ambulatory Visit (INDEPENDENT_AMBULATORY_CARE_PROVIDER_SITE_OTHER): Payer: Medicare Other | Admitting: Internal Medicine

## 2013-04-05 VITALS — BP 133/77 | HR 71 | Temp 98.2°F | Ht 71.9 in | Wt 200.0 lb

## 2013-04-05 DIAGNOSIS — J309 Allergic rhinitis, unspecified: Secondary | ICD-10-CM

## 2013-04-05 DIAGNOSIS — D229 Melanocytic nevi, unspecified: Secondary | ICD-10-CM

## 2013-04-05 DIAGNOSIS — Z0000 Encounter for general adult medical examination without abnormal findings: Secondary | ICD-10-CM

## 2013-04-05 DIAGNOSIS — I10 Essential (primary) hypertension: Secondary | ICD-10-CM

## 2013-04-05 DIAGNOSIS — R569 Unspecified convulsions: Secondary | ICD-10-CM

## 2013-04-05 LAB — LIPID PANEL
CHOLESTEROL: 212 mg/dL — AB (ref 0–200)
HDL: 70 mg/dL (ref >39)
LDL CALC: 130 mg/dL — AB (ref 0–99)
TRIGLYCERIDES: 59 mg/dL (ref <150)
Total CHOL/HDL Ratio: 3 Ratio
VLDL: 12 mg/dL (ref 0–40)

## 2013-04-05 LAB — COMPREHENSIVE METABOLIC PANEL
ALBUMIN: 4.1 g/dL (ref 3.5–5.2)
ALK PHOS: 95 U/L (ref 39–117)
ALT: 17 U/L (ref 0–53)
AST: 22 U/L (ref 0–37)
BUN: 11 mg/dL (ref 6–23)
CALCIUM: 9 mg/dL (ref 8.4–10.5)
CO2: 25 mEq/L (ref 19–32)
Chloride: 106 mEq/L (ref 96–112)
Creat: 0.85 mg/dL (ref 0.50–1.35)
GLUCOSE: 81 mg/dL (ref 70–99)
POTASSIUM: 3.9 meq/L (ref 3.5–5.3)
Sodium: 140 mEq/L (ref 135–145)
Total Bilirubin: 0.4 mg/dL (ref 0.3–1.2)
Total Protein: 6.9 g/dL (ref 6.0–8.3)

## 2013-04-05 LAB — TSH: TSH: 0.909 u[IU]/mL (ref 0.350–4.500)

## 2013-04-05 NOTE — Assessment & Plan Note (Addendum)
Well-controlled on double therapy, his neurologist Dr. Erling Cruz retired, He was planning to discontinue one of the 2 medications; needs to see if you neurologist. Plan: Dilantin level Refer to neurology (addendum-- states he will call Dr Krista Blue office and get an appointment)

## 2013-04-05 NOTE — Progress Notes (Signed)
Pre visit review using our clinic review tool, if applicable. No additional management support is needed unless otherwise documented below in the visit note. 

## 2013-04-05 NOTE — Patient Instructions (Signed)
Get your blood work before you leave   Next visit is for a physical exam in 1 year, fasting Please make an appointment     Check the  blood pressure 2 or 3 times a month  be sure it is between 110/60 and 140/85. Ideal blood pressure is 120/80. If it is consistently higher or lower, let me know  We are referring you to a dermatologist, if you don't hear from [place] in 2 weeks, please call the office     Fall Prevention and Home Safety Falls cause injuries and can affect all age groups. It is possible to use preventive measures to significantly decrease the likelihood of falls. There are many simple measures which can make your home safer and prevent falls. OUTDOORS  Repair cracks and edges of walkways and driveways.  Remove high doorway thresholds.  Trim shrubbery on the main path into your home.  Have good outside lighting.  Clear walkways of tools, rocks, debris, and clutter.  Check that handrails are not broken and are securely fastened. Both sides of steps should have handrails.  Have leaves, snow, and ice cleared regularly.  Use sand or salt on walkways during winter months.  In the garage, clean up grease or oil spills. BATHROOM  Install night lights.  Install grab bars by the toilet and in the tub and shower.  Use non-skid mats or decals in the tub or shower.  Place a plastic non-slip stool in the shower to sit on, if needed.  Keep floors dry and clean up all water on the floor immediately.  Remove soap buildup in the tub or shower on a regular basis.  Secure bath mats with non-slip, double-sided rug tape.  Remove throw rugs and tripping hazards from the floors. BEDROOMS  Install night lights.  Make sure a bedside light is easy to reach.  Do not use oversized bedding.  Keep a telephone by your bedside.  Have a firm chair with side arms to use for getting dressed.  Remove throw rugs and tripping hazards from the floor. KITCHEN  Keep handles on pots  and pans turned toward the center of the stove. Use back burners when possible.  Clean up spills quickly and allow time for drying.  Avoid walking on wet floors.  Avoid hot utensils and knives.  Position shelves so they are not too high or low.  Place commonly used objects within easy reach.  If necessary, use a sturdy step stool with a grab bar when reaching.  Keep electrical cables out of the way.  Do not use floor polish or wax that makes floors slippery. If you must use wax, use non-skid floor wax.  Remove throw rugs and tripping hazards from the floor. STAIRWAYS  Never leave objects on stairs.  Place handrails on both sides of stairways and use them. Fix any loose handrails. Make sure handrails on both sides of the stairways are as long as the stairs.  Check carpeting to make sure it is firmly attached along stairs. Make repairs to worn or loose carpet promptly.  Avoid placing throw rugs at the top or bottom of stairways, or properly secure the rug with carpet tape to prevent slippage. Get rid of throw rugs, if possible.  Have an electrician put in a light switch at the top and bottom of the stairs. OTHER FALL PREVENTION TIPS  Wear low-heel or rubber-soled shoes that are supportive and fit well. Wear closed toe shoes.  When using a stepladder, make sure  it is fully opened and both spreaders are firmly locked. Do not climb a closed stepladder.  Add color or contrast paint or tape to grab bars and handrails in your home. Place contrasting color strips on first and last steps.  Learn and use mobility aids as needed. Install an electrical emergency response system.  Turn on lights to avoid dark areas. Replace light bulbs that burn out immediately. Get light switches that glow.  Arrange furniture to create clear pathways. Keep furniture in the same place.  Firmly attach carpet with non-skid or double-sided tape.  Eliminate uneven floor surfaces.  Select a carpet  pattern that does not visually hide the edge of steps.  Be aware of all pets. OTHER HOME SAFETY TIPS  Set the water temperature for 120 F (48.8 C).  Keep emergency numbers on or near the telephone.  Keep smoke detectors on every level of the home and near sleeping areas. Document Released: 02/18/2002 Document Revised: 08/30/2011 Document Reviewed: 05/20/2011 [facility] Patient Information 2014 [place].

## 2013-04-05 NOTE — Progress Notes (Signed)
Subjective:    Patient ID: Joel [name], male    DOB: 1945/07/31, 68 y.o.   MRN: [id number]  HPI  Patient ID: Joel [name], male    DOB: [birth year], 68 y.o.   MRN: [id number]  HPI Here for Medicare AWV: 1. Risk factors based on Past M, S, F history: reviewed 2. Physical Activities: exercises x 5/week, gym, spin class   3. Depression/mood:  (-) screening   4. Hearing: R hearing loss, has a hearing aid    5. ADL's: independent , drives     6. Fall Risk: see instructions   7. home Safety: does feelsafe at home   8. Height, weight, &visual acuity: see VS, had a L retina detachment, also s/p B cataract surgery  9. Counseling: provided 10. Labs ordered based on risk factors: if needed   11. Referral Coordination: if needed 12.  Care Plan, see assessment and plan   13.   Cognitive Assessment: Motor skills and cognition intact  In addition, today we discussed the following: HTN, good medication compliance, BPs checked rarely although  when he does is within normal. Seizure disorder-- good compliance of medication, no recent SZ activity, due to see neurology. Has several moles at the back, has not seen dermatology in a while  Past Medical History  Diagnosis Date  . Seizure disorder     Dx remotely, last SZ activity aprox 1998  . Back pain     HNP dx ~2008, no surgery  . HTN (hypertension)   . Hearing loss     Rt ear sensorineural hearing loss, with hearing aide. W/u by Dr. Remer Macho extensively negative  . Fall 05-2011     had a IC bleed    Past Surgical History  Procedure Laterality Date  . Hernia repair      as an infant  . Craniotomy  05/27/2011    Procedure: CRANIOTOMY HEMATOMA EVACUATION SUBDURAL;  Surgeon: Hosie Spangle, MD;  Location: [facility];  Service: Neurosurgery;  Laterality: Right;  Evacuation of Subdural Hematoma  . Eye surgery  11-2011     for retinal detachment , L, Dr Zadie Rhine  . Cataract extraction Bilateral     R 2014, L 03-2013   History    Social History  . Marital Status: Single    Spouse Name: N/A    Number of Children: 2  . Years of Education: N/A   Occupational History  . Retired: [employer]    Social History Main Topics  . Smoking status: Never Smoker   . Smokeless tobacco: Never Used  . Alcohol Use: No     Comment: not in > 1 year  . Drug Use: No  . Sexual Activity: Not on file   Other Topics Concern  . Not on file   Social History Narrative   2 children, one in [place] another in [place]   Son lives w/ him   Family History  Problem Relation Age of Onset  . Emphysema Mother     smoker  . Hypertension Father     F and B   . Leukemia Sister   . Diabetes Neg Hx   . Prostate cancer Brother 50  . Colon cancer Neg Hx   . Coronary artery disease Neg Hx      Review of Systems No  CP, SOB, lower extremity edema Denies  nausea, vomiting diarrhea Denies  blood in the stools No GERD  Sx. (-) cough, sputum production No dysuria,  gross hematuria, difficulty urinating       Objective:   Physical Exam BP 133/77  Pulse 71  Temp(Src) 98.2 F (36.8 C)  Ht 5' 11.9" (1.826 m)  Wt 200 lb (90.719 kg)  BMI 27.21 kg/m2  SpO2 97% General -- alert, well-developed, NAD.  Neck --no thyromegaly , normal carotid pulse  HEENT-- Not pale.  Lungs -- normal respiratory effort, no intercostal retractions, no accessory muscle use, and normal breath sounds.  Heart-- normal rate, regular rhythm, no murmur.  Abdomen-- Not distended, good bowel sounds,soft, non-tender. Skin-- Multiple moles at the back, there is one in the middle of the back  that is about 3 mm and darker than the rest. Extremities-- no pretibial edema bilaterally  Neurologic--  alert & oriented X3. Speech normal, gait normal, strength normal in all extremities.  Psych-- Cognition and judgment appear intact. Cooperative with normal attention span and concentration. No anxious or depressed appearing.      Assessment & Plan:

## 2013-04-05 NOTE — Assessment & Plan Note (Addendum)
Td 09 Had a flu shot Pneumonia shot-- 2014 zostavax-- 2014  + FH  prostate cancer, he is asymptomatic,  PSAs consistently low x years: plan to check next year Had a colonoscopy 2003,  cscope again 04-2012, tubular adenoma, Dr Paulita Fujita, next 3 years per pt  Continue with healthy lifestyle !  Has multiple moles in the back, he is unable to monitor them. Refer to dermatology

## 2013-04-05 NOTE — Assessment & Plan Note (Signed)
Well-controlled, no change, labs

## 2013-04-05 NOTE — Assessment & Plan Note (Signed)
Well controlled with nasal steroids

## 2013-04-06 LAB — PHENYTOIN LEVEL, TOTAL: Phenytoin Lvl: 19.9 ug/mL (ref 10.0–20.0)

## 2013-04-08 ENCOUNTER — Telehealth: Payer: Self-pay

## 2013-04-08 ENCOUNTER — Telehealth: Payer: Self-pay | Admitting: Internal Medicine

## 2013-04-08 NOTE — Telephone Encounter (Signed)
Relevant patient education assigned to patient using Emmi. ° °

## 2013-04-08 NOTE — Telephone Encounter (Signed)
Error

## 2013-06-27 ENCOUNTER — Other Ambulatory Visit: Payer: Self-pay | Admitting: Neurology

## 2013-06-27 NOTE — Telephone Encounter (Signed)
[facility] patient.  Dr Erling Cruz said the patient could follow up with Dr Krista Blue

## 2013-07-01 ENCOUNTER — Telehealth: Payer: Self-pay | Admitting: *Deleted

## 2013-07-01 NOTE — Telephone Encounter (Signed)
I have confirmed appointment for 07-11-13.

## 2013-07-01 NOTE — Telephone Encounter (Signed)
Pharmacy along with patient called inquiring about refill for Levetiracetam (KEPPRA XR) 750 MG TB24.  Pt has only 2 tabs left, for this evening and tomorrow morning.  Per Janett Billow telephone call pt need to f/u with Dr. Krista Blue ([facility] Patient).  Patient will be out of town Thursday and Friday of this week, would like appt asap with Dr. Krista Blue.Marland KitchenMarland KitchenPlease advise..thanks

## 2013-07-01 NOTE — Telephone Encounter (Signed)
Dr. Krista Blue can you prescribe this Dr. Erling Cruz patient his [place] and I will make him a follow up appointment with you the end of April.  He was last seen 04-20-12.

## 2013-07-02 MED ORDER — LEVETIRACETAM ER 750 MG PO TB24: 750.0000 mg | ORAL_TABLET | Freq: Two times a day (BID) | ORAL | Status: DC

## 2013-07-02 NOTE — Telephone Encounter (Signed)
Rx has been sent  

## 2013-07-11 ENCOUNTER — Encounter: Payer: Self-pay | Admitting: Neurology

## 2013-07-11 ENCOUNTER — Ambulatory Visit (INDEPENDENT_AMBULATORY_CARE_PROVIDER_SITE_OTHER): Payer: Medicare Other | Admitting: Neurology

## 2013-07-11 VITALS — Ht 73.0 in | Wt 185.0 lb

## 2013-07-11 DIAGNOSIS — R569 Unspecified convulsions: Secondary | ICD-10-CM

## 2013-07-11 MED ORDER — LEVETIRACETAM ER 500 MG PO TB24: 1000.0000 mg | ORAL_TABLET | Freq: Every day | ORAL | Status: DC

## 2013-07-11 NOTE — Patient Instructions (Signed)
Tapering off dilantin 100mg  2 tabs twice a day now,   1st week, dilantin 1/1 2nd week, dilantin 0/1 3rd week 0/0  Increase Keppra xr to 1000 mg twice a day  EEG.

## 2013-07-11 NOTE — Progress Notes (Signed)
PATIENT: Joel [name] DOB: [birth year]  HISTORICAL  Joel [name] isa 68 years old right handed African American male with a history of seizures beginning since March of 1993. Previously patients of Dr. of Dr. Erling Cruz, last clinical visit was February 2014.  He would get up in the middle of the night to go the bathroom and awakened the next morning in bed with his tongue biten. Evaluation in 1993 with sleep deprived EEG and CAT scan of the brain without and with contrast were normal. 2-D echocardiogram and Holter monitoring evaluation and treadmill were normal. He had a similar episode of blacking out in the middle of the night in May of 1993. In May of 1994 he was admitted to [facility]. At that time he had an episode during the daytime and had loss of consciousness with jerking of his arms and legs and bit his tongue without urinary incontinence. EEG was normal and  he was placed on Dilantin since 1994. He developed transient elevated liver function tests with GGT 232. EEG 08/22/95 was normal.  MRI study of the  brain 05/21/96 showed a small left arachnoid cyst in the anterior temporal fossa, and questionable atrohy in the left hippocampus.   He had a seizure at his wife's funeral in March of 1998. He denies macropsia, micropsia, dj vu, strange odors or tastes.   Phenytoin level 06/23/09 was 9.0. DEXA scan 06/30/2009 revealed normal T. Scores.  He goes to a fitness center 7 days per week doing cardio, strength training, and "spins". He can balance on the balls of his feet for a long time.   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 He is independent in activities of daily living. He denies macropsia,micropsia, strange odors or tastes.  He had  sudden right ear hearing loss in 07/2010 and underwent MRI study 07/2010 and 08/31/2010 which was  unremarkable except for small arachnoid cyst in the anterior aspect of the left middle  fossa .He underwent a course of prednisone without benefit.  In mid December 2012 he got up in the middle of the night ,was on his toilet to void, stood up and next remembers awaking on the floor. He went back to bed and the next morning noted blood on his pillow from a  posterior head injury. CT scan showed a small left frontal bleed, thought  to represent a contrecoup injury. He was admitted to [facility] 02/25/11 and seen in consultation by Dr. Sherley Bounds, neurosurgeon He was followed as an outpatient. After repeat CT scan showed improvement he was allowed to return to the fitness center.   Mid February 2013 he had  problems walking without headache. He fell striking his head 05/12/2011 and developed worsening gait with a festinating quality. 05/19/2011, he fell in the  driveway. 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 He underwent craniotomy 3/15 by Dr. Jovita Gamma. He was discharged 06/02/2011.   After discharge he noted intermittent tingling in his left hand,face, and arm, lasting  30 seconds, 4 times per day.He had slurred  speech with the episodes. CT scan  06/06/11 showed bilateral subdurals right greater than left of 10 mm with 5 mm right to left midline shift. There was no evidence of new hemorrhage.   06/09/2011, he had a witnessed generalized major motor seizure and was seen in the [place] with a phenytoin level of  approximate 8.Keppra 750 mg twice a day was added on since then. In combination with his Dilantin 100 mg 2 tablets twice a day.  Repeat CT scan 5/13 showed SDHs had decreased and a density in the right frontal region of unknown etiology .CT scan 09/12/2011 showed resolution of tiny left SDH, interval decrease in the small, improving white matter edema in the right frontal lobe, and a small infarct in the left frontal lobe was slightly more prominent.  He is exercising daily. No seizures. He underwent scleral buckling OS for retinal  detachment by Dr. Zadie Rhine and has left eye cataract to be followed by Dr. Katy Fitch.  Bone density 01/12/2012 was normal  UPDATE April 30th 2015: He had a right eye cataract surgery in 2014, recovering very well,he has no recurrent seizures,he is now taking Keppra xr 750mg  bid, and Dilantin 100 mg 2 tablets twice a day.  He wants to consider tapering off Dilantin. He still exercise daily, 2-3 hours daily.     REVIEW OF SYSTEMS: Full 14 system review of systems performed and notable only for right ear hearing loss  ALLERGIES: No Known Allergies  HOME MEDICATIONS: Current Outpatient Prescriptions on File Prior to Visit  Medication Sig Dispense Refill  . AZOR 10-40 MG per tablet take 1 tablet by mouth once daily  90 tablet  1  . BROMFENAC SODIUM, ONCE-DAILY, OP Apply 1 drop to eye daily. Left eye only      . DILANTIN 100 MG ER capsule take 2 capsules by mouth twice a day  120 capsule  0  . furosemide (LASIX) 40 MG tablet take 1/2 tablet by mouth twice a day  30 tablet  11  . Levetiracetam (KEPPRA XR) 750 MG TB24 Take 1 tablet (750 mg total) by mouth 2 (two) times daily.  60 tablet  0  . triamcinolone (NASACORT) 55 MCG/ACT AERO nasal inhaler Place 2 sprays into the nose daily.      . [DISCONTINUED] calcium carbonate (CALCIUM 500) 1250 MG tablet Take 1 tablet by mouth daily.       . [DISCONTINUED] pantoprazole (PROTONIX) 20 MG tablet Take 1 tablet (20 mg total) by mouth daily.  14 tablet  0     PAST MEDICAL HISTORY: Past Medical History  Diagnosis Date  . Seizure disorder     Dx remotely, last SZ activity aprox 1998  . Back pain     HNP dx ~2008, no surgery  . HTN (hypertension)   . Hearing loss     Rt ear sensorineural hearing loss, with hearing aide. W/u by Dr. Remer Macho extensively negative  . Fall 05-2011     had a IC bleed     PAST SURGICAL HISTORY: Past Surgical History  Procedure Laterality Date  . Hernia repair      as an infant  . Craniotomy  05/27/2011    Procedure:  CRANIOTOMY HEMATOMA EVACUATION SUBDURAL;  Surgeon: Hosie Spangle, MD;  Location: [facility];  Service: Neurosurgery;  Laterality: Right;  Evacuation of Subdural Hematoma  . Eye surgery  11-2011     for retinal detachment , L, Dr Zadie Rhine  . Cataract extraction Bilateral     R 2014, L 03-2013    FAMILY HISTORY: Family History  Problem Relation Age of Onset  . Emphysema Mother     smoker  . Hypertension Father     F and B   . Leukemia Sister   . Diabetes Neg Hx   . Colon cancer Neg Hx   .  Coronary artery disease Neg Hx   . Prostate cancer Brother 58    SOCIAL HISTORY:  History   Social History  . Marital Status: Single    Spouse Name: N/A    Number of Children: 2  . Years of Education: PHD   Occupational History  . Retired: Prof [employer]    Social History Main Topics  . Smoking status: Never Smoker   . Smokeless tobacco: Never Used  . Alcohol Use: No     Comment: not in > 1 year  . Drug Use: No  . Sexual Activity: Not on file   Other Topics Concern  . Not on file   Social History Narrative   2 children, one in [place] another in [place]   Son lives with him.   Patient is retired.   Education PHD    Right handed.   Caffeine Three cups of coffee daily.     PHYSICAL EXAM   Filed Vitals:   07/11/13 0833  Height: 6\' 1"  (1.854 m)  Weight: 185 lb (83.915 kg)    Not recorded    Body mass index is 24.41 kg/(m^2).   Generalized: In no acute distress  Neck: Supple, no carotid bruits   Cardiac: Regular rate rhythm  Pulmonary: Clear to auscultation bilaterally  Musculoskeletal: No deformity  Neurological examination  Mentation: Alert oriented to time, place, history taking, and causual conversation  Cranial nerve II-XII: Pupils were equal round reactive to light. Extraocular movements were full.  Visual field were full on confrontational test. Bilateral fundi were sharp.  Facial sensation and strength were normal. Wearing hearing aid bilaterally.  Uvula tongue midline.  Head turning and shoulder shrug and were normal and symmetric.Tongue protrusion into cheek strength was normal.  Motor: Normal tone, bulk and strength.  Sensory: Intact to fine touch, pinprick, preserved vibratory sensation, and proprioception at toes.  Coordination: Normal finger to nose, heel-to-shin bilaterally there was no truncal ataxia  Gait: Rising up from seated position without assistance, normal stance, without trunk ataxia, moderate stride, good arm swing, smooth turning, able to perform tiptoe, and heel walking without difficulty.   Romberg signs: Negative  Deep tendon reflexes: Brachioradialis 2/2, biceps 2/2, triceps 2/2, patellar 2/2, Achilles 2/2, plantar responses were flexor bilaterally.   DIAGNOSTIC DATA (LABS, IMAGING, TESTING) - I reviewed patient records, labs, notes, testing and imaging myself where available.  Lab Results  Component Value Date   WBC 4.8 04/05/2012   HGB 14.0 04/05/2012   HCT 42.1 04/05/2012   MCV 97.6 04/05/2012   PLT 157.0 04/05/2012      Component Value Date/Time   NA 140 04/05/2013 1545   K 3.9 04/05/2013 1545   CL 106 04/05/2013 1545   CO2 25 04/05/2013 1545   GLUCOSE 81 04/05/2013 1545   BUN 11 04/05/2013 1545   CREATININE 0.85 04/05/2013 1545   CREATININE 1.0 04/05/2012 0850   CALCIUM 9.0 04/05/2013 1545   PROT 6.9 04/05/2013 1545   ALBUMIN 4.1 04/05/2013 1545   AST 22 04/05/2013 1545   ALT 17 04/05/2013 1545   ALKPHOS 95 04/05/2013 1545   BILITOT 0.4 04/05/2013 1545   GFRNONAA 89* 06/09/2011 1249   GFRAA >90 06/09/2011 1249   Lab Results  Component Value Date   CHOL 212* 04/05/2013   HDL 70 04/05/2013   LDLCALC 130* 04/05/2013   LDLDIRECT 109.4 04/05/2012   TRIG 59 04/05/2013   CHOLHDL 3.0 04/05/2013   No results found for this basename: HGBA1C   No  results found for this basename: VITAMINB12   Lab Results  Component Value Date   TSH 0.909 04/05/2013    ASSESSMENT AND PLAN  Joel [name] is a 68 y.o.  male with long-standing of seizure, last seizure was in March 2013,doing very well, on combination therapy of Keppra, Dilantin,  1.  Repeat EEG, I will call him result 2. Tapering off Dilantin  3.  Increase Keppra er to 1000mg  bid. 4. RTC in one year with Rhae Hammock, M.D. Ph.D.  [facility] [address] [place], [place] [address] [phone]

## 2013-07-15 ENCOUNTER — Telehealth: Payer: Self-pay | Admitting: Neurology

## 2013-07-15 ENCOUNTER — Ambulatory Visit (INDEPENDENT_AMBULATORY_CARE_PROVIDER_SITE_OTHER): Payer: Medicare Other | Admitting: Radiology

## 2013-07-15 DIAGNOSIS — R569 Unspecified convulsions: Secondary | ICD-10-CM

## 2013-07-15 NOTE — Telephone Encounter (Signed)
Spoke to patient and relayed normal EEG results and that he should continue to taper of Dilantin, per Dr. Krista Blue.

## 2013-07-15 NOTE — Telephone Encounter (Signed)
Please call patient, EEG was normal, he should continue tapering off Dilantin as previously discussed.

## 2013-07-15 NOTE — Procedures (Signed)
   HISTORY: 68 year old male, with past medical history of seizures since March 1993, he had a history of bilateral subdural hematoma with craniotomy in March 2013, last seizure was in March 2013, EEG for evaluation before tapering off Dilantin  TECHNIQUE:  16 channel EEG was performed based on standard 10-16 international system. One channel was dedicated to EKG, which has demonstrates normal sinus rhythm of 60 beats per minutes.  Upon awakening, the posterior background activity was well-developed, in alpha range, 10 Hz, with amplitude of 17 microvoltage, reactive to eye opening and closure.  There was no evidence of epilepsy form discharge.  Photic stimulation was performed, which induced a symmetric photic driving.  Hyperventilation was performed, there was no abnormality elicit.  Patient was drowsy during recording, but no deeper stage of sleep was achieved.  CONCLUSION: This is a  normal awake EEG.  There is no electrodiagnostic evidence of epileptiform discharge

## 2013-07-18 ENCOUNTER — Other Ambulatory Visit: Payer: Self-pay

## 2013-07-18 MED ORDER — LEVETIRACETAM ER 500 MG PO TB24: 1000.0000 mg | ORAL_TABLET | Freq: Two times a day (BID) | ORAL | Status: DC

## 2013-07-18 NOTE — Telephone Encounter (Signed)
Last OV note says: 1. Repeat EEG, I will call him result  2. Tapering off Dilantin  3. Increase Keppra er to 1000mg  bid.

## 2013-08-22 ENCOUNTER — Ambulatory Visit: Payer: Medicare Other | Admitting: Nurse Practitioner

## 2013-09-30 ENCOUNTER — Telehealth: Payer: Self-pay | Admitting: Internal Medicine

## 2013-09-30 MED ORDER — AMLODIPINE-OLMESARTAN 10-40 MG PO TABS: ORAL_TABLET | ORAL | Status: DC

## 2013-09-30 NOTE — Telephone Encounter (Signed)
Done

## 2013-09-30 NOTE — Telephone Encounter (Signed)
Caller name:Corrigan Relation to BE:[id number] Call back [place]: [facility] [address]  Reason for call: to request a refill on AZOR 10-40 MG per tablet

## 2013-12-25 ENCOUNTER — Other Ambulatory Visit: Payer: Self-pay | Admitting: Internal Medicine

## 2014-02-05 ENCOUNTER — Encounter: Payer: Self-pay | Admitting: Neurology

## 2014-05-30 ENCOUNTER — Ambulatory Visit (INDEPENDENT_AMBULATORY_CARE_PROVIDER_SITE_OTHER): Payer: Medicare Other | Admitting: Internal Medicine

## 2014-05-30 ENCOUNTER — Encounter: Payer: Self-pay | Admitting: Internal Medicine

## 2014-05-30 VITALS — BP 134/74 | HR 51 | Temp 97.7°F | Ht 72.5 in | Wt 198.5 lb

## 2014-05-30 DIAGNOSIS — Z0000 Encounter for general adult medical examination without abnormal findings: Secondary | ICD-10-CM

## 2014-05-30 DIAGNOSIS — Z125 Encounter for screening for malignant neoplasm of prostate: Secondary | ICD-10-CM

## 2014-05-30 DIAGNOSIS — I10 Essential (primary) hypertension: Secondary | ICD-10-CM | POA: Diagnosis not present

## 2014-05-30 DIAGNOSIS — G40909 Epilepsy, unspecified, not intractable, without status epilepticus: Secondary | ICD-10-CM

## 2014-05-30 DIAGNOSIS — Z23 Encounter for immunization: Secondary | ICD-10-CM

## 2014-05-30 LAB — CBC WITH DIFFERENTIAL/PLATELET
Basophils Absolute: 0 10*3/uL (ref 0.0–0.1)
Basophils Relative: 0.6 % (ref 0.0–3.0)
EOS ABS: 0.1 10*3/uL (ref 0.0–0.7)
Eosinophils Relative: 1.2 % (ref 0.0–5.0)
HCT: 38.1 % — ABNORMAL LOW (ref 39.0–52.0)
HEMOGLOBIN: 12.4 g/dL — AB (ref 13.0–17.0)
Lymphocytes Relative: 20.8 % (ref 12.0–46.0)
Lymphs Abs: 1.1 10*3/uL (ref 0.7–4.0)
MCHC: 32.5 g/dL (ref 30.0–36.0)
MCV: 87.8 fl (ref 78.0–100.0)
MONO ABS: 0.5 10*3/uL (ref 0.1–1.0)
Monocytes Relative: 9.5 % (ref 3.0–12.0)
NEUTROS ABS: 3.7 10*3/uL (ref 1.4–7.7)
Neutrophils Relative %: 67.9 % (ref 43.0–77.0)
Platelets: 182 10*3/uL (ref 150.0–400.0)
RBC: 4.34 Mil/uL (ref 4.22–5.81)
RDW: 15.1 % (ref 11.5–15.5)
WBC: 5.5 10*3/uL (ref 4.0–10.5)

## 2014-05-30 LAB — LIPID PANEL
CHOL/HDL RATIO: 3
Cholesterol: 196 mg/dL (ref 0–200)
HDL: 62.5 mg/dL (ref >39.00)
LDL CALC: 122 mg/dL — AB (ref 0–99)
NonHDL: 133.5
Triglycerides: 59 mg/dL (ref 0.0–149.0)
VLDL: 11.8 mg/dL (ref 0.0–40.0)

## 2014-05-30 LAB — BASIC METABOLIC PANEL
BUN: 13 mg/dL (ref 6–23)
CO2: 29 meq/L (ref 19–32)
Calcium: 9.1 mg/dL (ref 8.4–10.5)
Chloride: 107 mEq/L (ref 96–112)
Creatinine, Ser: 1.03 mg/dL (ref 0.40–1.50)
GFR: 92.58 mL/min (ref >60.00)
GLUCOSE: 97 mg/dL (ref 70–99)
Potassium: 3.9 mEq/L (ref 3.5–5.1)
Sodium: 138 mEq/L (ref 135–145)

## 2014-05-30 LAB — PSA: PSA: 1.89 ng/mL (ref 0.10–4.00)

## 2014-05-30 LAB — ALT: ALT: 23 U/L (ref 0–53)

## 2014-05-30 LAB — AST: AST: 28 U/L (ref 0–37)

## 2014-05-30 NOTE — Progress Notes (Signed)
Pre visit review using our clinic review tool, if applicable. No additional management support is needed unless otherwise documented below in the visit note. 

## 2014-05-30 NOTE — Patient Instructions (Signed)
Get your blood work before you leave   Check the  blood pressure 2 or 3 times a month   Be sure your blood pressure is between 110/65 and  145/85.  if it is consistently higher or lower, let me know    Come back to the office in 1 year for a physical exam  Please schedule an appointment at the front desk    Come back fasting       Fall Prevention and Home Safety Falls cause injuries and can affect all age groups. It is possible to use preventive measures to significantly decrease the likelihood of falls. There are many simple measures which can make your home safer and prevent falls. OUTDOORS  Repair cracks and edges of walkways and driveways.  Remove high doorway thresholds.  Trim shrubbery on the main path into your home.  Have good outside lighting.  Clear walkways of tools, rocks, debris, and clutter.  Check that handrails are not broken and are securely fastened. Both sides of steps should have handrails.  Have leaves, snow, and ice cleared regularly.  Use sand or salt on walkways during winter months.  In the garage, clean up grease or oil spills. BATHROOM  Install night lights.  Install grab bars by the toilet and in the tub and shower.  Use non-skid mats or decals in the tub or shower.  Place a plastic non-slip stool in the shower to sit on, if needed.  Keep floors dry and clean up all water on the floor immediately.  Remove soap buildup in the tub or shower on a regular basis.  Secure bath mats with non-slip, double-sided rug tape.  Remove throw rugs and tripping hazards from the floors. BEDROOMS  Install night lights.  Make sure a bedside light is easy to reach.  Do not use oversized bedding.  Keep a telephone by your bedside.  Have a firm chair with side arms to use for getting dressed.  Remove throw rugs and tripping hazards from the floor. KITCHEN  Keep handles on pots and pans turned toward the center of the stove. Use back burners when  possible.  Clean up spills quickly and allow time for drying.  Avoid walking on wet floors.  Avoid hot utensils and knives.  Position shelves so they are not too high or low.  Place commonly used objects within easy reach.  If necessary, use a sturdy step stool with a grab bar when reaching.  Keep electrical cables out of the way.  Do not use floor polish or wax that makes floors slippery. If you must use wax, use non-skid floor wax.  Remove throw rugs and tripping hazards from the floor. STAIRWAYS  Never leave objects on stairs.  Place handrails on both sides of stairways and use them. Fix any loose handrails. Make sure handrails on both sides of the stairways are as long as the stairs.  Check carpeting to make sure it is firmly attached along stairs. Make repairs to worn or loose carpet promptly.  Avoid placing throw rugs at the top or bottom of stairways, or properly secure the rug with carpet tape to prevent slippage. Get rid of throw rugs, if possible.  Have an electrician put in a light switch at the top and bottom of the stairs. OTHER FALL PREVENTION TIPS  Wear low-heel or rubber-soled shoes that are supportive and fit well. Wear closed toe shoes.  When using a stepladder, make sure it is fully opened and both spreaders are  firmly locked. Do not climb a closed stepladder.  Add color or contrast paint or tape to grab bars and handrails in your home. Place contrasting color strips on first and last steps.  Learn and use mobility aids as needed. Install an electrical emergency response system.  Turn on lights to avoid dark areas. Replace light bulbs that burn out immediately. Get light switches that glow.  Arrange furniture to create clear pathways. Keep furniture in the same place.  Firmly attach carpet with non-skid or double-sided tape.  Eliminate uneven floor surfaces.  Select a carpet pattern that does not visually hide the edge of steps.  Be aware of all  pets. OTHER HOME SAFETY TIPS  Set the water temperature for 120 F (48.8 C).  Keep emergency numbers on or near the telephone.  Keep smoke detectors on every level of the home and near sleeping areas. Document Released: 02/18/2002 Document Revised: 08/30/2011 Document Reviewed: 05/20/2011 [facility] Patient Information 2015 [place], [place]. This information is not intended to replace advice given to you by your health care provider. Make sure you discuss any questions you have with your health care provider.   Preventive Care for Adults Ages 32 and over  Blood pressure check.** / Every 1 to 2 years.  Lipid and cholesterol check.**/ Every 5 years beginning at age 51.  Lung cancer screening. / Every year if you are aged 59-80 years and have a 30-pack-year history of smoking and currently smoke or have quit within the past 15 years. Yearly screening is stopped once you have quit smoking for at least 15 years or develop a health problem that would prevent you from having lung cancer treatment.  Fecal occult blood test (FOBT) of stool. / Every year beginning at age 30 and continuing until age 90. You may not have to do this test if you get a colonoscopy every 10 years.  Flexible sigmoidoscopy** or colonoscopy.** / Every 5 years for a flexible sigmoidoscopy or every 10 years for a colonoscopy beginning at age 75 and continuing until age 36.  Hepatitis C blood test.** / For all people born from [birth year] through 1963 and any individual with known risks for hepatitis C.  Abdominal aortic aneurysm (AAA) screening.** / A one-time screening for ages 51 to 17 years who are current or former smokers.  Skin self-exam. / Monthly.  Influenza vaccine. / Every year.  Tetanus, diphtheria, and acellular pertussis (Tdap/Td) vaccine.** / 1 dose of Td every 10 years.  Varicella vaccine.** / Consult your health care provider.  Zoster vaccine.** / 1 dose for adults aged 73 years or older.  Pneumococcal  13-valent conjugate (PCV13) vaccine.** / Consult your health care provider.  Pneumococcal polysaccharide (PPSV23) vaccine.** / 1 dose for all adults aged 32 years and older.  Meningococcal vaccine.** / Consult your health care provider.  Hepatitis A vaccine.** / Consult your health care provider.  Hepatitis B vaccine.** / Consult your health care provider.  Haemophilus influenzae type b (Hib) vaccine.** / Consult your health care provider. **Family history and personal history of risk and conditions may change your health care provider's recommendations. Document Released: 04/26/2001 Document Revised: 03/05/2013 Document Reviewed: 07/26/2010 [facility] Patient Information 2015 [place], [place]. This information is not intended to replace advice given to you by your health care provider. Make sure you discuss any questions you have with your health care provider.

## 2014-05-30 NOTE — Progress Notes (Signed)
Subjective:    Patient ID: Joel [name], male    DOB: [birth year], 69 y.o.   MRN: [id number]  DOS:  05/30/2014 Type of visit - description :   HPI Here for Medicare AWV: 1. Risk factors based on Past M, S, F history: reviewed 2. Physical Activities: exercises x 5/week, gym, spin class  3. Depression/mood: (-) screening  4. Hearing: R hearing loss, has a hearing aid  5. ADL's: independent , drives  6. Fall Risk: see instructions  7. home Safety: does feel safe at home  8. Height, weight, &visual acuity: see VS, had a L retina detachment, also s/p B cataract surgery, sees eye doctor, vision ~ 20/20 B, still inflammation, on meds  9. Counseling: provided 10. Labs ordered based on risk factors: if needed  11. Referral Coordination: if needed 12. Care Plan, see assessment and plan  13. Cognitive Assessment: Motor skills and cognition intact 14. Care team updated 15. End-of-life care discussed already with daughter, his insurance has mail him information   We also discussed the following: Hypertension, under excellent control, he checks BPs at home  and when he donates blood. Allergies, on Nasacort, well-controlled Seizure disorder, no recent events, has a follow-up pending with neurology   Review of Systems  Constitutional: No fever, chills. No unexplained wt changes. No unusual sweats HEENT: No dental problems, ear discharge, facial swelling, voice changes. No eye discharge, redness or intolerance to light Respiratory: No wheezing or difficulty breathing. No cough , mucus production Cardiovascular: No CP, leg swelling or palpitations GI: no nausea, vomiting, diarrhea or abdominal pain.  No blood in the stools. No dysphagia   Endocrine: No polyphagia, polyuria or polydipsia GU: No dysuria, gross hematuria, difficulty urinating. No urinary urgency or frequency. Musculoskeletal: No joint swellings or unusual aches or pains Skin: No change in the color of the  skin, palor or rash Allergic, immunologic: No environmental allergies or food allergies Neurological: No dizziness or syncope. No headaches. No diplopia, slurred speech, motor deficits, facial numbness Hematological: No enlarged lymph nodes, easy bruising or bleeding Psychiatry: No suicidal ideas, hallucinations, behavior problems or confusion. No unusual/severe anxiety or depression.    Past Medical History  Diagnosis Date  . Seizure disorder     Dx remotely, last SZ activity aprox 1998  . Back pain     HNP dx ~2008, no surgery  . HTN (hypertension)   . Hearing loss     Rt ear sensorineural hearing loss, with hearing aide. W/u by Dr. Remer Macho extensively negative  . Fall 05-2011     had a IC bleed     Past Surgical History  Procedure Laterality Date  . Hernia repair      as an infant  . Craniotomy  05/27/2011    Procedure: CRANIOTOMY HEMATOMA EVACUATION SUBDURAL;  Surgeon: Hosie Spangle, MD;  Location: [facility];  Service: Neurosurgery;  Laterality: Right;  Evacuation of Subdural Hematoma  . Eye surgery  11-2011     for retinal detachment , L, Dr Zadie Rhine  . Cataract extraction Bilateral     R 2014, L 03-2013    History   Social History  . Marital Status: Single    Spouse Name: N/A  . Number of Children: 2  . Years of Education: PHD   Occupational History  . Retired: Prof [employer]    Social History Main Topics  . Smoking status: Never Smoker   . Smokeless tobacco: Never Used  . Alcohol Use: No  Comment: not in > 1 year  . Drug Use: No  . Sexual Activity: Not on file   Other Topics Concern  . Not on file   Social History Narrative   2 children, one in [place] another in [place]   Lives by himself   Patient is retired.   Education PHD    Right handed.   Caffeine Three cups of coffee daily.   Family History  Problem Relation Age of Onset  . Emphysema Mother     smoker  . Hypertension Father     F and B   . Leukemia Sister   . Diabetes Neg Hx   .  Colon cancer Neg Hx   . Coronary artery disease Neg Hx   . Prostate cancer Brother 44         Medication List       This list is accurate as of: 05/30/14 11:59 PM.  Always use your most recent med list.               AZOR 10-40 MG per tablet  Generic drug:  amLODipine-olmesartan  take 1 tablet by mouth once daily     BROMFENAC SODIUM (ONCE-DAILY) OP  Apply 1 drop to eye daily. Left eye only     levETIRAcetam 500 MG 24 hr tablet  Commonly known as:  KEPPRA XR  Take 2 tablets (1,000 mg total) by mouth 2 (two) times daily.     multivitamin-lutein Caps capsule  Take 1 capsule by mouth daily.     triamcinolone 55 MCG/ACT Aero nasal inhaler  Commonly known as:  NASACORT  Place 2 sprays into the nose daily.           Objective:   Physical Exam BP 134/74 mmHg  Pulse 51  Temp(Src) 97.7 F (36.5 C) (Oral)  Ht 6' 0.5" (1.842 m)  Wt 198 lb 8 oz (90.039 kg)  BMI 26.54 kg/m2  SpO2 98% General:   Well developed, well nourished . NAD.  Neck:  Full range of motion. Supple. No  thyromegaly , normal carotid pulse HEENT:  Normocephalic . Face symmetric, atraumatic Lungs:  CTA B Normal respiratory effort, no intercostal retractions, no accessory muscle use. Heart: RRR,  no murmur.  Abdomen:  Not distended, soft, non-tender. No rebound or rigidity. No mass,organomegaly Muscle skeletal: no pretibial edema bilaterally  Skin: Exposed areas without rash. Not pale. Not jaundice Rectal:  External abnormalities: none. Normal sphincter tone. No rectal masses or tenderness.  Stool brown  Prostate: Prostate gland firm and smooth, no enlargement, nodularity, tenderness, mass, asymmetry or induration.  Neurologic:  alert & oriented X3.  Speech normal, gait appropriate for age and unassisted Strength symmetric and appropriate for age.  Psych: Cognition and judgment appear intact.  Cooperative with normal attention span and concentration.  Behavior appropriate. No anxious or  depressed appearing.      Assessment & Plan:

## 2014-05-30 NOTE — Assessment & Plan Note (Addendum)
Td 09 Had a flu shot Pneumonia shot-- 2014 prevnar-- today zostavax-- 2014  + FH prostate cancer, he is asymptomatic,DRE today  Had a colonoscopy 2003, cscope again 04-2012, tubular adenoma, Dr Paulita Fujita, next 3 years per pt   Continue with healthy lifestyle !  Has multiple moles in the back, saw  Dermatology 2015, told ok, was not rec to schedule a RTC

## 2014-05-30 NOTE — Assessment & Plan Note (Addendum)
BP under excellent control, all readings less than 120/80. Continue with azor, he's not taking Lasix. We'll check BMP, LFTs, CBC, cholesterol

## 2014-05-30 NOTE — Assessment & Plan Note (Signed)
Follow-up by Dr. Krista Blue, asymptomatic

## 2014-06-02 ENCOUNTER — Other Ambulatory Visit (INDEPENDENT_AMBULATORY_CARE_PROVIDER_SITE_OTHER): Payer: Medicare Other

## 2014-06-02 DIAGNOSIS — D649 Anemia, unspecified: Secondary | ICD-10-CM

## 2014-06-02 LAB — IRON: Iron: 52 ug/dL (ref 42–165)

## 2014-06-02 LAB — FERRITIN: FERRITIN: 8.9 ng/mL — AB (ref 22.0–322.0)

## 2014-06-04 ENCOUNTER — Telehealth: Payer: Self-pay | Admitting: Internal Medicine

## 2014-06-04 NOTE — Telephone Encounter (Signed)
error:[id number] ° °

## 2014-06-30 ENCOUNTER — Other Ambulatory Visit: Payer: Self-pay | Admitting: Internal Medicine

## 2014-07-01 ENCOUNTER — Telehealth: Payer: Self-pay

## 2014-07-01 DIAGNOSIS — D509 Iron deficiency anemia, unspecified: Secondary | ICD-10-CM

## 2014-07-01 NOTE — Telephone Encounter (Signed)
-----   Message from Colon Branch, MD sent at 06/29/2014  9:00 AM EDT ----- Regarding: gi Base on last results, needed to see GI, i don't think we entered a referral, please do, dx iron deficiency

## 2014-07-01 NOTE — Telephone Encounter (Signed)
Referral placed to GI 

## 2014-07-10 ENCOUNTER — Ambulatory Visit: Payer: Medicare Other | Admitting: Neurology

## 2014-07-10 NOTE — Telephone Encounter (Signed)
error:[id number] ° °

## 2014-07-14 ENCOUNTER — Encounter: Payer: Self-pay | Admitting: Neurology

## 2014-07-14 ENCOUNTER — Ambulatory Visit (INDEPENDENT_AMBULATORY_CARE_PROVIDER_SITE_OTHER): Payer: Medicare Other | Admitting: Neurology

## 2014-07-14 ENCOUNTER — Ambulatory Visit: Payer: Medicare Other | Admitting: Nurse Practitioner

## 2014-07-14 VITALS — BP 131/79 | HR 64 | Ht 72.5 in | Wt 197.0 lb

## 2014-07-14 DIAGNOSIS — R569 Unspecified convulsions: Secondary | ICD-10-CM

## 2014-07-14 MED ORDER — LEVETIRACETAM ER 500 MG PO TB24: 1500.0000 mg | ORAL_TABLET | Freq: Every day | ORAL | Status: DC

## 2014-07-14 NOTE — Progress Notes (Signed)
PATIENT: Joel [name] DOB: [birth year]  HISTORICAL  Joel [name] isa 69 years old right handed African American male with a history of seizures beginning since March of 1993. Previously patients of Dr. of Dr. Erling Cruz, last clinical visit was February 2014.  He would get up in the middle of the night to go the bathroom and awakened the next morning in bed with his tongue biten. Evaluation in 1993 with sleep deprived EEG and CAT scan of the brain without and with contrast were normal. 2-D echocardiogram and Holter monitoring evaluation and treadmill were normal. He had a similar episode of blacking out in the middle of the night in May of 1993. In May of 1994 he was admitted to [facility]. At that time he had an episode during the daytime and had loss of consciousness with jerking of his arms and legs and bit his tongue without urinary incontinence. EEG was normal and  he was placed on Dilantin since 1994. He developed transient elevated liver function tests with GGT 232. EEG 08/22/95 was normal.  MRI study of the  brain 05/21/96 showed a small left arachnoid cyst in the anterior temporal fossa, and questionable atrohy in the left hippocampus.   He had a seizure at his wife's funeral in March of 1998. He denies macropsia, micropsia, dj vu, strange odors or tastes.   Phenytoin level 06/23/09 was 9.0. DEXA scan 06/30/2009 revealed normal T. Scores.  He goes to a fitness center 7 days per week doing cardio, strength training, and "spins". He can balance on the balls of his feet for a long time.   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 He is independent in activities of daily living. He denies macropsia,micropsia, strange odors or tastes.  He had  sudden right ear hearing loss in 07/2010 and underwent MRI study 07/2010 and 08/31/2010 which was  unremarkable except for small arachnoid cyst in the anterior aspect of the left middle  fossa .He underwent a course of prednisone without benefit.  In mid December 2012 he got up in the middle of the night ,was on his toilet to void, stood up and next remembers awaking on the floor. He went back to bed and the next morning noted blood on his pillow from a  posterior head injury. CT scan showed a small left frontal bleed, thought  to represent a contrecoup injury. He was admitted to [facility] 02/25/11 and seen in consultation by Dr. Sherley Bounds, neurosurgeon He was followed as an outpatient. After repeat CT scan showed improvement he was allowed to return to the fitness center.   Mid February 2013 he had  problems walking without headache. He fell striking his head 05/12/2011 and developed worsening gait with a festinating quality. 05/19/2011, he fell in the  driveway. 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 He underwent craniotomy 3/15 by Dr. Jovita Gamma. He was discharged 06/02/2011.   After discharge he noted intermittent tingling in his left hand,face, and arm, lasting  30 seconds, 4 times per day.He had slurred  speech with the episodes. CT scan  06/06/11 showed bilateral subdurals right greater than left of 10 mm with 5 mm right to left midline shift. There was no evidence of new hemorrhage.   06/09/2011, he had a witnessed generalized major motor seizure and was seen in the [place] with a phenytoin level of  approximate 8.Keppra 750 mg twice a day was added on since then. In combination with his Dilantin 100 mg 2 tablets twice a day.  Repeat CT scan 5/13 showed SDHs had decreased and a density in the right frontal region of unknown etiology .CT scan 09/12/2011 showed resolution of tiny left SDH, interval decrease in the small, improving white matter edema in the right frontal lobe, and a small infarct in the left frontal lobe was slightly more prominent.  He is exercising daily. No seizures. He underwent scleral buckling OS for retinal  detachment by Dr. Zadie Rhine and has left eye cataract to be followed by Dr. Katy Fitch.  Bone density 01/12/2012 was normal  UPDATE April 30th 2015: He had a right eye cataract surgery in 2014, recovering very well,he has no recurrent seizures,he is now taking Keppra xr 750mg  bid, and Dilantin 100 mg 2 tablets twice a day.  He wants to consider tapering off Dilantin. He still exercise daily, 2-3 hours daily.  UPDATE May 2nd 2016: He continue to exercise, has taper of Dilantin now, taking Keppra xr 500 mg 2 tablets twice a day, no significant side effect     REVIEW OF SYSTEMS: Full 14 system review of systems performed and notable only for right ear hearing loss  ALLERGIES: No Known Allergies  HOME MEDICATIONS: Current Outpatient Prescriptions on File Prior to Visit  Medication Sig Dispense Refill  . AZOR 10-40 MG per tablet take 1 tablet by mouth once daily  90 tablet  1  . BROMFENAC SODIUM, ONCE-DAILY, OP Apply 1 drop to eye daily. Left eye only      . DILANTIN 100 MG ER capsule take 2 capsules by mouth twice a day  120 capsule  0  . furosemide (LASIX) 40 MG tablet take 1/2 tablet by mouth twice a day  30 tablet  11  . Levetiracetam (KEPPRA XR) 750 MG TB24 Take 1 tablet (750 mg total) by mouth 2 (two) times daily.  60 tablet  0  . triamcinolone (NASACORT) 55 MCG/ACT AERO nasal inhaler Place 2 sprays into the nose daily.      . [DISCONTINUED] calcium carbonate (CALCIUM 500) 1250 MG tablet Take 1 tablet by mouth daily.       . [DISCONTINUED] pantoprazole (PROTONIX) 20 MG tablet Take 1 tablet (20 mg total) by mouth daily.  14 tablet  0     PAST MEDICAL HISTORY: Past Medical History  Diagnosis Date  . Seizure disorder     Dx remotely, last SZ activity aprox 1998  . Back pain     HNP dx ~2008, no surgery  . HTN (hypertension)   . Hearing loss     Rt ear sensorineural hearing loss, with hearing aide. W/u by Dr. Remer Macho extensively negative  . Fall 05-2011     had a IC bleed   . Anemia      Dr. Paulita Fujita    PAST SURGICAL HISTORY: Past Surgical History  Procedure Laterality Date  . Hernia repair      as an infant  . Craniotomy  05/27/2011    Procedure: CRANIOTOMY HEMATOMA EVACUATION SUBDURAL;  Surgeon: Hosie Spangle, MD;  Location: [place] [facility];  Service: Neurosurgery;  Laterality: Right;  Evacuation of Subdural Hematoma  . Eye surgery  11-2011     for retinal detachment , L, Dr Zadie Rhine  . Cataract extraction Bilateral     R 2014, L 03-2013    FAMILY HISTORY: Family History  Problem Relation Age of Onset  .  Emphysema Mother     smoker  . Hypertension Father     F and B   . Leukemia Sister   . Diabetes Neg Hx   . Colon cancer Neg Hx   . Coronary artery disease Neg Hx   . Prostate cancer Brother 74    SOCIAL HISTORY:  History   Social History  . Marital Status: Single    Spouse Name: N/A    Number of Children: 2  . Years of Education: PHD   Occupational History  . Retired: Prof [employer]    Social History Main Topics  . Smoking status: Never Smoker   . Smokeless tobacco: Never Used  . Alcohol Use: No     Comment: not in > 1 year  . Drug Use: No  . Sexual Activity: Not on file   Other Topics Concern  . Not on file   Social History Narrative   2 children, one in [place] another in [place]   Son lives with him.   Patient is retired.   Education PHD    Right handed.   Caffeine Three cups of coffee daily.     PHYSICAL EXAM   Filed Vitals:   07/14/14 1054  BP: 131/79  Pulse: 64  Height: 6' 0.5" (1.842 m)  Weight: 197 lb (89.359 kg)    Not recorded      Body mass index is 26.34 kg/(m^2). PHYSICAL EXAMNIATION:  Gen: NAD, conversant, well nourised, obese, well groomed                     Cardiovascular: Regular rate rhythm, no peripheral edema, warm, nontender. Eyes: Conjunctivae clear without exudates or hemorrhage Neck: Supple, no carotid bruise. Pulmonary: Clear to auscultation bilaterally   NEUROLOGICAL EXAM:  MENTAL  STATUS: Speech:    Speech is normal; fluent and spontaneous with normal comprehension.  Cognition:    The patient is oriented to person, place, and time;     recent and remote memory intact;     language fluent;     normal attention, concentration,     fund of knowledge.  CRANIAL NERVES: CN II: Visual fields are full to confrontation. Fundoscopic exam is normal with sharp discs and no vascular changes. Venous pulsations are present bilaterally. Pupils are 4 mm and briskly reactive to light. Visual acuity is 20/20 bilaterally. CN III, IV, VI: extraocular movement are normal. No ptosis. CN V: Facial sensation is intact to pinprick in all 3 divisions bilaterally. Corneal responses are intact.  CN VII: Face is symmetric with normal eye closure and smile. CN VIII: Hearing is normal to rubbing fingers CN IX, X: Palate elevates symmetrically. Phonation is normal. CN XI: Head turning and shoulder shrug are intact CN XII: Tongue is midline with normal movements and no atrophy.  MOTOR: There is no pronator drift of out-stretched arms. Muscle bulk and tone are normal. Muscle strength is normal.   Shoulder abduction Shoulder external rotation Elbow flexion Elbow extension Wrist flexion Wrist extension Finger abduction Hip flexion Knee flexion Knee extension Ankle dorsi flexion Ankle plantar flexion  R 5 5 5 5 5 5 5 5 5 5 5 5   L 5 5 5 5 5 5 5 5 5 5 5 5     REFLEXES: Reflexes are 2+ and symmetric at the biceps, triceps, knees, and ankles. Plantar responses are flexor.  SENSORY: Light touch, pinprick, position sense, and vibration sense are intact in fingers and toes.  COORDINATION: Rapid alternating movements  and fine finger movements are intact. There is no dysmetria on finger-to-nose and heel-knee-shin. There are no abnormal or extraneous movements.   GAIT/STANCE: Posture is normal. Gait is steady with normal steps, base, arm swing, and turning. Heel and toe walking are normal. Tandem gait  is normal.  Romberg is absent.   DIAGNOSTIC DATA (LABS, IMAGING, TESTING) - I reviewed patient records, labs, notes, testing and imaging myself where available.  Lab Results  Component Value Date   WBC 5.5 05/30/2014   HGB 12.4* 05/30/2014   HCT 38.1* 05/30/2014   MCV 87.8 05/30/2014   PLT 182.0 05/30/2014      Component Value Date/Time   NA 138 05/30/2014 0923   K 3.9 05/30/2014 0923   CL 107 05/30/2014 0923   CO2 29 05/30/2014 0923   GLUCOSE 97 05/30/2014 0923   BUN 13 05/30/2014 0923   CREATININE 1.03 05/30/2014 0923   CREATININE 0.85 04/05/2013 1545   CALCIUM 9.1 05/30/2014 0923   PROT 6.9 04/05/2013 1545   ALBUMIN 4.1 04/05/2013 1545   AST 28 05/30/2014 0923   ALT 23 05/30/2014 0923   ALKPHOS 95 04/05/2013 1545   BILITOT 0.4 04/05/2013 1545   GFRNONAA 89* 06/09/2011 1249   GFRAA >90 06/09/2011 1249   Lab Results  Component Value Date   CHOL 196 05/30/2014   HDL 62.50 05/30/2014   LDLCALC 122* 05/30/2014   LDLDIRECT 109.4 04/05/2012   TRIG 59.0 05/30/2014   CHOLHDL 3 05/30/2014   No results found for: HGBA1C No results found for: VITAMINB12 Lab Results  Component Value Date   TSH 0.909 04/05/2013    ASSESSMENT AND PLAN  LUDWIN FLAHIVE is a 69 y.o. male with long-standing of seizure, last seizure was in March 2013,doing very well, on combination therapy of Keppra, Dilantin,  1.  He is overall doing well, I will change him to Keppra xr 500mg  3 tabs qhs 2. RTC in one year   Marcial Pacas, M.D. Ph.D.  [facility] [address] [place], [place] [address] [phone]

## 2015-06-24 LAB — HM COLONOSCOPY

## 2015-07-02 ENCOUNTER — Other Ambulatory Visit: Payer: Self-pay

## 2015-07-02 MED ORDER — AMLODIPINE-OLMESARTAN 10-40 MG PO TABS: 1.0000 | ORAL_TABLET | Freq: Every day | ORAL | Status: DC

## 2015-07-08 ENCOUNTER — Ambulatory Visit (INDEPENDENT_AMBULATORY_CARE_PROVIDER_SITE_OTHER): Payer: Medicare Other | Admitting: Internal Medicine

## 2015-07-08 ENCOUNTER — Encounter: Payer: Self-pay | Admitting: Internal Medicine

## 2015-07-08 VITALS — BP 146/78 | HR 58 | Temp 98.0°F | Resp 16 | Ht 72.0 in | Wt 193.4 lb

## 2015-07-08 DIAGNOSIS — I10 Essential (primary) hypertension: Secondary | ICD-10-CM

## 2015-07-08 DIAGNOSIS — Z8042 Family history of malignant neoplasm of prostate: Secondary | ICD-10-CM | POA: Diagnosis not present

## 2015-07-08 DIAGNOSIS — Z0000 Encounter for general adult medical examination without abnormal findings: Secondary | ICD-10-CM | POA: Diagnosis not present

## 2015-07-08 DIAGNOSIS — Z09 Encounter for follow-up examination after completed treatment for conditions other than malignant neoplasm: Secondary | ICD-10-CM | POA: Insufficient documentation

## 2015-07-08 MED ORDER — AMLODIPINE-OLMESARTAN 10-40 MG PO TABS: 1.0000 | ORAL_TABLET | Freq: Every day | ORAL | Status: DC

## 2015-07-08 NOTE — Patient Instructions (Signed)
Get your blood work before you leave     Please consider visit these websites for more information: ---www.begintheconversation.org ---theconversationproject.org   Check the  blood pressure  weekly  Be sure your blood pressure is between 110/65 and  14085.  if it is consistently higher or lower, let me know     Next visit 1 year     Fall Prevention and Home Safety Falls cause injuries and can affect all age groups. It is possible to use preventive measures to significantly decrease the likelihood of falls. There are many simple measures which can make your home safer and prevent falls. OUTDOORS  Repair cracks and edges of walkways and driveways.  Remove high doorway thresholds.  Trim shrubbery on the main path into your home.  Have good outside lighting.  Clear walkways of tools, rocks, debris, and clutter.  Check that handrails are not broken and are securely fastened. Both sides of steps should have handrails.  Have leaves, snow, and ice cleared regularly.  Use sand or salt on walkways during winter months.  In the garage, clean up grease or oil spills. BATHROOM  Install night lights.  Install grab bars by the toilet and in the tub and shower.  Use non-skid mats or decals in the tub or shower.  Place a plastic non-slip stool in the shower to sit on, if needed.  Keep floors dry and clean up all water on the floor immediately.  Remove soap buildup in the tub or shower on a regular basis.  Secure bath mats with non-slip, double-sided rug tape.  Remove throw rugs and tripping hazards from the floors. BEDROOMS  Install night lights.  Make sure a bedside light is easy to reach.  Do not use oversized bedding.  Keep a telephone by your bedside.  Have a firm chair with side arms to use for getting dressed.  Remove throw rugs and tripping hazards from the floor. KITCHEN  Keep handles on pots and pans turned toward the center of the stove. Use back burners  when possible.  Clean up spills quickly and allow time for drying.  Avoid walking on wet floors.  Avoid hot utensils and knives.  Position shelves so they are not too high or low.  Place commonly used objects within easy reach.  If necessary, use a sturdy step stool with a grab bar when reaching.  Keep electrical cables out of the way.  Do not use floor polish or wax that makes floors slippery. If you must use wax, use non-skid floor wax.  Remove throw rugs and tripping hazards from the floor. STAIRWAYS  Never leave objects on stairs.  Place handrails on both sides of stairways and use them. Fix any loose handrails. Make sure handrails on both sides of the stairways are as long as the stairs.  Check carpeting to make sure it is firmly attached along stairs. Make repairs to worn or loose carpet promptly.  Avoid placing throw rugs at the top or bottom of stairways, or properly secure the rug with carpet tape to prevent slippage. Get rid of throw rugs, if possible.  Have an electrician put in a light switch at the top and bottom of the stairs. OTHER FALL PREVENTION TIPS  Wear low-heel or rubber-soled shoes that are supportive and fit well. Wear closed toe shoes.  When using a stepladder, make sure it is fully opened and both spreaders are firmly locked. Do not climb a closed stepladder.  Add color or contrast paint or  tape to grab bars and handrails in your home. Place contrasting color strips on first and last steps.  Learn and use mobility aids as needed. Install an electrical emergency response system.  Turn on lights to avoid dark areas. Replace light bulbs that burn out immediately. Get light switches that glow.  Arrange furniture to create clear pathways. Keep furniture in the same place.  Firmly attach carpet with non-skid or double-sided tape.  Eliminate uneven floor surfaces.  Select a carpet pattern that does not visually hide the edge of steps.  Be aware of  all pets. OTHER HOME SAFETY TIPS  Set the water temperature for 120 F (48.8 C).  Keep emergency numbers on or near the telephone.  Keep smoke detectors on every level of the home and near sleeping areas. Document Released: 02/18/2002 Document Revised: 08/30/2011 Document Reviewed: 05/20/2011 [facility] Patient Information 2015 [place], [place]. This information is not intended to replace advice given to you by your health care provider. Make sure you discuss any questions you have with your health care provider.   Preventive Care for Adults Ages 46 and over  Blood pressure check.** / Every 1 to 2 years.  Lipid and cholesterol check.**/ Every 5 years beginning at age 35.  Lung cancer screening. / Every year if you are aged 58-80 years and have a 30-pack-year history of smoking and currently smoke or have quit within the past 15 years. Yearly screening is stopped once you have quit smoking for at least 15 years or develop a health problem that would prevent you from having lung cancer treatment.  Fecal occult blood test (FOBT) of stool. / Every year beginning at age 61 and continuing until age 21. You may not have to do this test if you get a colonoscopy every 10 years.  Flexible sigmoidoscopy** or colonoscopy.** / Every 5 years for a flexible sigmoidoscopy or every 10 years for a colonoscopy beginning at age 28 and continuing until age 57.  Hepatitis C blood test.** / For all people born from [birth year] through 1965 and any individual with known risks for hepatitis C.  Abdominal aortic aneurysm (AAA) screening.** / A one-time screening for ages 86 to 9 years who are current or former smokers.  Skin self-exam. / Monthly.  Influenza vaccine. / Every year.  Tetanus, diphtheria, and acellular pertussis (Tdap/Td) vaccine.** / 1 dose of Td every 10 years.  Varicella vaccine.** / Consult your health care provider.  Zoster vaccine.** / 1 dose for adults aged 59 years or older.  Pneumococcal  13-valent conjugate (PCV13) vaccine.** / Consult your health care provider.  Pneumococcal polysaccharide (PPSV23) vaccine.** / 1 dose for all adults aged 4 years and older.  Meningococcal vaccine.** / Consult your health care provider.  Hepatitis A vaccine.** / Consult your health care provider.  Hepatitis B vaccine.** / Consult your health care provider.  Haemophilus influenzae type b (Hib) vaccine.** / Consult your health care provider. **Family history and personal history of risk and conditions may change your health care provider's recommendations. Document Released: 04/26/2001 Document Revised: 03/05/2013 Document Reviewed: 07/26/2010 [facility] Patient Information 2015 [place], [place]. This information is not intended to replace advice given to you by your health care provider. Make sure you discuss any questions you have with your health care provider.

## 2015-07-08 NOTE — Progress Notes (Signed)
Subjective:    Patient ID: Joel [name], male    DOB: 1945/11/04, 70 y.o.   MRN: [id number]  DOS:  07/08/2015 Type of visit - description : Physical exam Interval history: He remains very active, eats healthy, good compliance with medication, BP today slightly elevated but at home is normal.   Review of Systems  Constitutional: No fever. No chills. No unexplained wt changes. No unusual sweats  HEENT: No dental problems, no ear discharge, no facial swelling, no voice changes. No eye discharge, no eye  redness , no  intolerance to light   Respiratory: No wheezing , no  difficulty breathing. No cough , no mucus production  Cardiovascular: No CP, no leg swelling , no  Palpitations  GI: no nausea, no vomiting, no diarrhea , no  abdominal pain.  No blood in the stools. No dysphagia, no odynophagia    Endocrine: No polyphagia, no polyuria , no polydipsia  GU: No dysuria, gross hematuria, difficulty urinating. No urinary urgency, no frequency.  Musculoskeletal: No joint swellings or unusual aches or pains  Skin: No change in the color of the skin, palor , no  Rash  Allergic, immunologic: No environmental allergies , no  food allergies  Neurological: No dizziness no  syncope. No headaches. No diplopia, no slurred, no slurred speech, no motor deficits, no facial  Numbness  Hematological: No enlarged lymph nodes, no easy bruising , no unusual bleedings  Psychiatry: No suicidal ideas, no hallucinations, no beavior problems, no confusion.  No unusual/severe anxiety, no depression  Past Medical History  Diagnosis Date  . Seizure disorder ([place])     Dx remotely, last SZ activity aprox 1998  . Back pain     HNP dx ~2008, no surgery  . HTN (hypertension)   . Hearing loss     Rt ear sensorineural hearing loss, with hearing aide. W/u by Dr. Remer Macho extensively negative  . Fall 05-2011     had a IC bleed   . Anemia     Dr. Paulita Fujita    Past Surgical History  Procedure  Laterality Date  . Hernia repair      as an infant  . Craniotomy  05/27/2011    Procedure: CRANIOTOMY HEMATOMA EVACUATION SUBDURAL;  Surgeon: Hosie Spangle, MD;  Location: [place] [facility];  Service: Neurosurgery;  Laterality: Right;  Evacuation of Subdural Hematoma  . Eye surgery  11-2011     for retinal detachment , L, Dr Zadie Rhine  . Cataract extraction Bilateral     R 2014, L 03-2013  . Wisdom tooth extraction      x2, Upper  . Colonoscopy  06/2015   Family History  Problem Relation Age of Onset  . Emphysema Mother     smoker  . Hypertension Father     F and B   . Leukemia Sister   . Diabetes Neg Hx   . Colon cancer Neg Hx   . Coronary artery disease Neg Hx   . Prostate cancer Brother 73    Social History   Social History  . Marital Status: Single    Spouse Name: N/A  . Number of Children: 2  . Years of Education: PHD   Occupational History  . Retired: Prof [employer]    Social History Main Topics  . Smoking status: Never Smoker   . Smokeless tobacco: Never Used  . Alcohol Use: No     Comment: not in  years  . Drug Use: No  . Sexual  Activity: Not on file   Other Topics Concern  . Not on file   Social History Narrative   2 children, one in [place] another in [place]   Lives by himself   Patient is retired.   Education PHD    Right handed.   Caffeine Three cups of coffee daily.        Medication List       This list is accurate as of: 07/08/15  5:28 PM.  Always use your most recent med list.               amLODipine-olmesartan 10-40 MG tablet  Commonly known as:  AZOR  Take 1 tablet by mouth daily.     BROMFENAC SODIUM (ONCE-DAILY) OP  Apply 1 drop to eye daily. Left eye only     levETIRAcetam 500 MG 24 hr tablet  Commonly known as:  KEPPRA XR  Take 3 tablets (1,500 mg total) by mouth at bedtime.     triamcinolone 55 MCG/ACT Aero nasal inhaler  Commonly known as:  NASACORT  Place 2 sprays into the nose daily.           Objective:    Physical Exam BP 146/78 mmHg  Pulse 58  Temp(Src) 98 F (36.7 C) (Oral)  Resp 16  Ht 6' (1.829 m)  Wt 193 lb 6 oz (87.714 kg)  BMI 26.22 kg/m2  SpO2 99% General:   Well developed, well nourished . NAD.  HEENT:  Normocephalic . Face symmetric, atraumatic Neck: No thyromegaly, normal carotid pulse Lungs:  CTA B Normal respiratory effort, no intercostal retractions, no accessory muscle use. Heart: RRR,  no murmur.  no pretibial edema bilaterally  Abdomen:  Not distended, soft, non-tender. No rebound or rigidity.  Skin: Not pale. Not jaundice Neurologic:  alert & oriented X3.  Speech normal, gait appropriate for age and unassisted Psych--  Cognition and judgment appear intact.  Cooperative with normal attention span and concentration.  Behavior appropriate. No anxious or depressed appearing.    Assessment & Plan:   Assessment  HTN Seizure disorder, last activity 1998 R hearing loss, h/o ENT eval (-) Intracranial bleed due to fall 2013 Anemia -- Dr. Paulita Fujita H/o back pain, NPH, no surgery 2008 H/o   moles in the back, saw  Dermatology 2015, told ok, was not rec to schedule a RTC  Plan: HTN: Continue azor, check a CMP and FLP. BP today slightly elevated but at home and when he goes to donate blood is 125/70. No change Seizure disorder: Good compliance of medication, no recent activity, follow-up by neurology Anemia: Recently seen by GI, had a colonoscopy, labs were done and reportedly very good. RTC one year

## 2015-07-08 NOTE — Assessment & Plan Note (Signed)
HTN: Continue azor, check a CMP and FLP. BP today slightly elevated but at home and when he goes to donate blood is 125/70. No change Seizure disorder: Good compliance of medication, no recent activity, follow-up by neurology Anemia: Recently seen by GI, had a colonoscopy, labs were done and reportedly very good. RTC one year

## 2015-07-08 NOTE — Assessment & Plan Note (Addendum)
Td 09; Pneumonia shot-- 2014; prevnar-- 2016; zostavax-- 2014  + FH prostate cancer, asx,DRE (-) 2016, check a PSA   colonoscopy 2003, cscope   04-2012, tubular adenoma, again 06-2015 -- next 5 year per pt (GI  Dr Paulita Fujita) Continue with healthy lifestyle !

## 2015-07-08 NOTE — Progress Notes (Signed)
Pre visit review using our clinic review tool, if applicable. No additional management support is needed unless otherwise documented below in the visit note/SLS  

## 2015-07-09 LAB — COMPREHENSIVE METABOLIC PANEL
ALK PHOS: 87 U/L (ref 39–117)
ALT: 20 U/L (ref 0–53)
AST: 27 U/L (ref 0–37)
Albumin: 4.3 g/dL (ref 3.5–5.2)
BUN: 15 mg/dL (ref 6–23)
CO2: 26 mEq/L (ref 19–32)
Calcium: 9.8 mg/dL (ref 8.4–10.5)
Chloride: 106 mEq/L (ref 96–112)
Creatinine, Ser: 1.05 mg/dL (ref 0.40–1.50)
GFR: 90.25 mL/min (ref >60.00)
Glucose, Bld: 74 mg/dL (ref 70–99)
POTASSIUM: 4.1 meq/L (ref 3.5–5.1)
Sodium: 141 mEq/L (ref 135–145)
TOTAL PROTEIN: 7.3 g/dL (ref 6.0–8.3)
Total Bilirubin: 1.1 mg/dL (ref 0.2–1.2)

## 2015-07-09 LAB — LIPID PANEL
Cholesterol: 189 mg/dL (ref 0–200)
HDL: 56.1 mg/dL (ref >39.00)
LDL Cholesterol: 121 mg/dL — ABNORMAL HIGH (ref 0–99)
NonHDL: 133.16
Total CHOL/HDL Ratio: 3
Triglycerides: 63 mg/dL (ref 0.0–149.0)
VLDL: 12.6 mg/dL (ref 0.0–40.0)

## 2015-07-09 LAB — PSA: PSA: 2 ng/mL (ref 0.10–4.00)

## 2015-07-13 ENCOUNTER — Encounter (INDEPENDENT_AMBULATORY_CARE_PROVIDER_SITE_OTHER): Payer: Self-pay | Admitting: Nurse Practitioner

## 2015-07-13 ENCOUNTER — Other Ambulatory Visit: Payer: Self-pay | Admitting: *Deleted

## 2015-07-13 ENCOUNTER — Encounter: Payer: Self-pay | Admitting: Nurse Practitioner

## 2015-07-13 DIAGNOSIS — Z0289 Encounter for other administrative examinations: Secondary | ICD-10-CM

## 2015-07-13 MED ORDER — LEVETIRACETAM ER 500 MG PO TB24: 1500.0000 mg | ORAL_TABLET | Freq: Every day | ORAL | Status: DC

## 2015-07-13 NOTE — Telephone Encounter (Signed)
Refilled keppra as pt stated he was on last day of medication. Pt had to be rescheduled today, and will be seen tomorrow at 1630 07-14-15.

## 2015-07-14 ENCOUNTER — Ambulatory Visit (INDEPENDENT_AMBULATORY_CARE_PROVIDER_SITE_OTHER): Payer: Medicare Other | Admitting: Nurse Practitioner

## 2015-07-14 ENCOUNTER — Encounter: Payer: Self-pay | Admitting: Nurse Practitioner

## 2015-07-14 VITALS — BP 127/70 | HR 61 | Ht 72.0 in | Wt 194.4 lb

## 2015-07-14 DIAGNOSIS — G40909 Epilepsy, unspecified, not intractable, without status epilepticus: Secondary | ICD-10-CM

## 2015-07-14 MED ORDER — LEVETIRACETAM ER 500 MG PO TB24: 1500.0000 mg | ORAL_TABLET | Freq: Every day | ORAL | Status: DC

## 2015-07-14 NOTE — Patient Instructions (Signed)
Continue Keppra at current dose will refill Call for any seizure activity Follow up yearly

## 2015-07-14 NOTE — Progress Notes (Signed)
[facility]  PATIENT: Joel [name] DOB: November 07, 1945   REASON FOR VISIT: Follow-up for seizure disorder HISTORY FROM: Patient    HISTORY OF PRESENT ILLNESS: Joel [name] isa 69 years old right handed [place] American male with a history of seizures beginning since March of 1993. Previously patients of Dr. of Dr. Erling Cruz, last clinical visit was February 2014. He would get up in the middle of the night to go the bathroom and awakened the next morning in bed with his tongue biten. Evaluation in 1993 with sleep deprived EEG and CAT scan of the brain without and with contrast were normal. 2-D echocardiogram and Holter monitoring evaluation and treadmill were normal. He had a similar episode of blacking out in the middle of the night in May of 1993. In May of 1994 he was admitted to [facility]. At that time he had an episode during the daytime and had loss of consciousness with jerking of his arms and legs and bit his tongue without urinary incontinence. EEG was normal and he was placed on Dilantin since 1994. He developed transient elevated liver function tests with GGT 232. EEG 08/22/95 was normal. MRI study of the brain 05/21/96 showed a small left arachnoid cyst in the anterior temporal fossa, and questionable atrohy in the left hippocampus.  He had a seizure at his wife's funeral in March of 1998. He denies macropsia, micropsia, dj vu, strange odors or tastes.   Phenytoin level 06/23/09 was 9.0. DEXA scan 06/30/2009 revealed normal T. Scores. He goes to a fitness center 7 days per week doing cardio, strength training, and "spins". He can balance on the balls of his feet for a long time.  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 He is independent in activities of daily living. He denies macropsia,micropsia, strange odors or tastes. He had sudden right ear hearing loss in 07/2010 and underwent MRI study  07/2010 and 08/31/2010 which was unremarkable except for small arachnoid cyst in the anterior aspect of the left middle fossa .He underwent a course of prednisone without benefit. In mid December 2012 he got up in the middle of the night ,was on his toilet to void, stood up and next remembers awaking on the floor. He went back to bed and the next morning noted blood on his pillow from a posterior head injury. CT scan showed a small left frontal bleed, thought to represent a contrecoup injury. He was admitted to [facility] 02/25/11 and seen in consultation by Dr. Sherley Bounds, neurosurgeon He was followed as an outpatient. After repeat CT scan showed improvement he was allowed to return to the fitness center.   Mid February 2013 he had problems walking without headache. He fell striking his head 05/12/2011 and developed worsening gait with a festinating quality. 05/19/2011, he fell in the driveway. 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 He underwent craniotomy 3/15 by Dr. Jovita Gamma. He was discharged 06/02/2011.   After discharge he noted intermittent tingling in his left hand,face, and arm, lasting 30 seconds, 4 times per day.He had slurred speech with the episodes. CT scan 06/06/11 showed bilateral subdurals right greater than left of 10 mm with 5 mm right to left midline shift. There was no evidence of new hemorrhage.   06/09/2011, he had a witnessed generalized major motor seizure and was seen in the [place] with a phenytoin level  of approximate 8.Keppra 750 mg twice a day was added on since then. In combination with his Dilantin 100 mg 2 tablets twice a day.  Repeat CT scan 5/13 showed SDHs had decreased and a density in the right frontal region of unknown etiology .CT scan 09/12/2011 showed resolution of tiny left SDH, interval decrease in the small, improving white matter edema in the right frontal lobe, and a small infarct in the left frontal lobe was  slightly more prominent.  He is exercising daily. No seizures. He underwent scleral buckling OS for retinal detachment by Dr. Zadie Rhine and has left eye cataract to be followed by Dr. Katy Fitch. Bone density 01/12/2012 was normal  UPDATE April 30th 2015: He had a right eye cataract surgery in 2014, recovering very well,he has no recurrent seizures,he is now taking Keppra xr 750mg  bid, and Dilantin 100 mg 2 tablets twice a day. He wants to consider tapering off Dilantin. He still exercise daily, 2-3 hours daily.  UPDATE May 2nd 2016: He continue to exercise, has taper of Dilantin now, taking Keppra xr 500 mg 2 tablets twice a day, no significant side effect  UPDATE 05/02/2017[name] returns for yearly follow-up for his seizure disorder. He has previously been followed by Dr.Yan and Dr. Erling Cruz. Records reviewed Now taking Keppra XR 750 mg twice daily without side effects. Last seizure activity March 2013. He continues to exercise twice a day. No new neurologic complaints he returns for reevaluation    REVIEW OF SYSTEMS: Full 14 system review of systems performed and notable only for those listed, all others are neg:  Constitutional: neg  Cardiovascular: neg Ear/Nose/Throat: neg  Skin: neg Eyes: neg Respiratory: neg Gastroitestinal: neg  Hematology/Lymphatic: neg  Endocrine: neg Musculoskeletal:neg Allergy/Immunology: neg Neurological: neg Psychiatric: neg Sleep : neg   ALLERGIES: No Known Allergies  HOME MEDICATIONS: Outpatient Prescriptions Prior to Visit  Medication Sig Dispense Refill  . amLODipine-olmesartan (AZOR) 10-40 MG tablet Take 1 tablet by mouth daily. 90 tablet 1  . BROMFENAC SODIUM, ONCE-DAILY, OP Apply 1 drop to eye daily. Left eye only    . levETIRAcetam (KEPPRA XR) 500 MG 24 hr tablet Take 3 tablets (1,500 mg total) by mouth at bedtime. 90 tablet 0  . triamcinolone (NASACORT) 55 MCG/ACT AERO nasal inhaler Place 2 sprays into the nose daily.     No  facility-administered medications prior to visit.    PAST MEDICAL HISTORY: Past Medical History  Diagnosis Date  . Seizure disorder ([place])     Dx remotely, last SZ activity aprox 1998  . Back pain     HNP dx ~2008, no surgery  . HTN (hypertension)   . Hearing loss     Rt ear sensorineural hearing loss, with hearing aide. W/u by Dr. Remer Macho extensively negative  . Fall 05-2011     had a IC bleed   . Anemia     Dr. Paulita Fujita    PAST SURGICAL HISTORY: Past Surgical History  Procedure Laterality Date  . Hernia repair      as an infant  . Craniotomy  05/27/2011    Procedure: CRANIOTOMY HEMATOMA EVACUATION SUBDURAL;  Surgeon: Hosie Spangle, MD;  Location: [facility];  Service: Neurosurgery;  Laterality: Right;  Evacuation of Subdural Hematoma  . Eye surgery  11-2011     for retinal detachment , L, Dr Zadie Rhine  . Cataract extraction Bilateral     R 2014, L 03-2013  . Wisdom tooth extraction      x2,  Upper  . Colonoscopy  06/2015    FAMILY HISTORY: Family History  Problem Relation Age of Onset  . Emphysema Mother     smoker  . Hypertension Father     F and B   . Leukemia Sister   . Diabetes Neg Hx   . Colon cancer Neg Hx   . Coronary artery disease Neg Hx   . Prostate cancer Brother 76    SOCIAL HISTORY: Social History   Social History  . Marital Status: Single    Spouse Name: N/A  . Number of Children: 2  . Years of Education: PHD   Occupational History  . Retired: Prof [employer]    Social History Main Topics  . Smoking status: Never Smoker   . Smokeless tobacco: Never Used  . Alcohol Use: No     Comment: not in  years  . Drug Use: No  . Sexual Activity: Not on file   Other Topics Concern  . Not on file   Social History Narrative   2 children, one in [place] another in [place]   Lives by himself   Patient is retired.   Education PHD    Right handed.   Caffeine Three cups of coffee daily.     PHYSICAL EXAM  Filed Vitals:   07/14/15 1613  BP:  127/70  Pulse: 61  Height: 6' (1.829 m)  Weight: 194 lb 6.4 oz (88.179 kg)   Body mass index is 26.36 kg/(m^2).  Generalized: Well developed, in no acute distress  Head: normocephalic and atraumatic,. Oropharynx benign  Neck: Supple, no carotid bruits  Cardiac: Regular rate rhythm, no murmur  Musculoskeletal: No deformity   Neurological examination   Mentation: Alert oriented to time, place, history taking. Attention span and concentration appropriate. Recent and remote memory intact.  Follows all commands speech and language fluent.   Cranial nerve II-XII: Pupils were equal round reactive to light extraocular movements were full, visual field were full on confrontational test. Facial sensation and strength were normal. hearing was intact to finger rubbing bilaterally. Uvula tongue midline. head turning and shoulder shrug were normal and symmetric.Tongue protrusion into cheek strength was normal. Motor: normal bulk and tone, full strength in the BUE, BLE, fine finger movements normal, no pronator drift. No focal weakness Sensory: normal and symmetric to light touch, pinprick, and  Vibration, proprioception  Coordination: finger-nose-finger, heel-to-shin bilaterally, no dysmetria Reflexes: Brachioradialis 2/2, biceps 2/2, triceps 2/2, patellar 2/2, Achilles 2/2, plantar responses were flexor bilaterally. Gait and Station: Rising up from seated position without assistance, normal stance,  moderate stride, good arm swing, smooth turning, able to perform tiptoe, and heel walking without difficulty. Tandem gait is steady  DIAGNOSTIC DATA (LABS, IMAGING, TESTING) -     Component Value Date/Time   NA 141 07/08/2015 1422   K 4.1 07/08/2015 1422   CL 106 07/08/2015 1422   CO2 26 07/08/2015 1422   GLUCOSE 74 07/08/2015 1422   BUN 15 07/08/2015 1422   CREATININE 1.05 07/08/2015 1422   CREATININE 0.85 04/05/2013 1545   CALCIUM 9.8 07/08/2015 1422   PROT 7.3 07/08/2015 1422   ALBUMIN 4.3  07/08/2015 1422   AST 27 07/08/2015 1422   ALT 20 07/08/2015 1422   ALKPHOS 87 07/08/2015 1422   BILITOT 1.1 07/08/2015 1422   GFRNONAA 89* 06/09/2011 1249   GFRAA >90 06/09/2011 1249   Lab Results  Component Value Date   CHOL 189 07/08/2015   HDL 56.10 07/08/2015   [place]  121* 07/08/2015   LDLDIRECT 109.4 04/05/2012   TRIG 63.0 07/08/2015   CHOLHDL 3 07/08/2015    ASSESSMENT AND PLAN  70 y.o. year old male  has a past medical history of Seizure disorder ([place]); Back pain; HTN (hypertension); Hearing loss; Fall (05-2011 ); and Anemia. here to follow-up for seizure disorder that is well controlled.  PLAN: Continue Keppra XR  at current dose will refill Call for any seizure activity Follow up yearly  Dennie Bible, [facility], [facility], [facility] [address], [place], [place] [address] [phone]

## 2015-07-14 NOTE — Progress Notes (Signed)
Error, due to pt rescheduled to 07-14-15 at 1630.   This encounter was created in error - please disregard.

## 2015-07-15 NOTE — Progress Notes (Signed)
I have reviewed and agreed above plan. 

## 2016-02-10 ENCOUNTER — Other Ambulatory Visit: Payer: Self-pay | Admitting: Internal Medicine

## 2016-03-21 ENCOUNTER — Telehealth: Payer: Self-pay | Admitting: Internal Medicine

## 2016-03-21 NOTE — Telephone Encounter (Signed)
05/30/14 PR PPPS, SUBSEQ VISIT [id number] patient scheduled medicare wellness  for 07/11/16 at 2:30pm and physical with PCP directly after.

## 2016-07-04 ENCOUNTER — Ambulatory Visit: Payer: Medicare Other | Admitting: *Deleted

## 2016-07-07 ENCOUNTER — Telehealth: Payer: Self-pay | Admitting: *Deleted

## 2016-07-07 ENCOUNTER — Encounter: Payer: Self-pay | Admitting: Nurse Practitioner

## 2016-07-07 ENCOUNTER — Ambulatory Visit (INDEPENDENT_AMBULATORY_CARE_PROVIDER_SITE_OTHER): Payer: Medicare Other | Admitting: Nurse Practitioner

## 2016-07-07 ENCOUNTER — Other Ambulatory Visit: Payer: Self-pay | Admitting: *Deleted

## 2016-07-07 VITALS — BP 142/78 | HR 63 | Wt 208.6 lb

## 2016-07-07 DIAGNOSIS — G40909 Epilepsy, unspecified, not intractable, without status epilepticus: Secondary | ICD-10-CM

## 2016-07-07 MED ORDER — LEVETIRACETAM ER 500 MG PO TB24: 1500.0000 mg | ORAL_TABLET | Freq: Every day | ORAL | 3 refills | Status: DC

## 2016-07-07 NOTE — Telephone Encounter (Signed)
Great. Placed on schedule.

## 2016-07-07 NOTE — Telephone Encounter (Signed)
LMVM for pt on home and cell regarding rescheduling appt from 07-13-16  1615 to today at 1515 or to another time/day.

## 2016-07-07 NOTE — Patient Instructions (Signed)
Continue Keppra XR  at current dose will refill Call for any seizure activity Follow up yearly   

## 2016-07-07 NOTE — Telephone Encounter (Signed)
Pt called back, he is coming at 3:15 today

## 2016-07-07 NOTE — Progress Notes (Signed)
[facility]  PATIENT: Joel [name] DOB: [birth year]   REASON FOR VISIT: Follow-up for seizure disorder HISTORY FROM: Patient    HISTORY OF PRESENT ILLNESS: Joel [name] isa 71 years old right handed [place] American male with a history of seizures beginning since March of 1993. Previously patients of Dr. of Joel [name], last clinical visit was February 2014. He would get up in the middle of the night to go the bathroom and awakened the next morning in bed with his tongue biten. Evaluation in 1993 with sleep deprived EEG and CAT scan of the brain without and with contrast were normal. 2-D echocardiogram and Holter monitoring evaluation and treadmill were normal. He had a similar episode of blacking out in the middle of the night in May of 1993. In May of 1994 he was admitted to [facility]. At that time he had an episode during the daytime and had loss of consciousness with jerking of his arms and legs and bit his tongue without urinary incontinence. EEG was normal and he was placed on Dilantin since 1994. He developed transient elevated liver function tests with GGT 232. EEG 08/22/95 was normal. MRI study of the brain 05/21/96 showed a small left arachnoid cyst in the anterior temporal fossa, and questionable atrohy in the left hippocampus.  He had a seizure at his wife's funeral in March of 1998. He denies macropsia, micropsia, dj vu, strange odors or tastes.   Phenytoin level 06/23/09 was 9.0. DEXA scan 06/30/2009 revealed normal T. Scores. He goes to a fitness center 7 days per week doing cardio, strength training, and "spins". He can balance on the balls of his feet for a long time.  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 He is independent in activities of daily living. He denies macropsia,micropsia, strange odors or tastes. He had sudden right ear hearing loss in 07/2010 and underwent MRI study  07/2010 and 08/31/2010 which was unremarkable except for small arachnoid cyst in the anterior aspect of the left middle fossa .He underwent a course of prednisone without benefit. In mid December 2012 he got up in the middle of the night ,was on his toilet to void, stood up and next remembers awaking on the floor. He went back to bed and the next morning noted blood on his pillow from a posterior head injury. CT scan showed a small left frontal bleed, thought to represent a contrecoup injury. He was admitted to [facility] 02/25/11 and seen in consultation by Joel [name], neurosurgeon He was followed as an outpatient. After repeat CT scan showed improvement he was allowed to return to the fitness center.   Mid February 2013 he had problems walking without headache. He fell striking his head 05/12/2011 and developed worsening gait with a festinating quality. 05/19/2011, he fell in the driveway. 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 He underwent craniotomy 3/15 by Joel [name]. He was discharged 06/02/2011.  After discharge he noted intermittent tingling in his left hand,face, and arm, lasting 30 seconds, 4 times per day.He had slurred speech with the episodes. CT scan 06/06/11 showed bilateral subdurals right greater than left of 10 mm with 5 mm right to left midline shift. There was no evidence of new hemorrhage.   06/09/2011, he had a witnessed generalized major motor seizure and was seen in the [place] with a phenytoin level of  approximate 8.Keppra 750 mg twice a day was added on since then. In combination with his Dilantin 100 mg 2 tablets twice a day. Repeat CT scan 5/13 showed SDHs had decreased and a density in the right frontal region of unknown etiology .CT scan 09/12/2011 showed resolution of tiny left SDH, interval decrease in the small, improving white matter edema in the right frontal lobe, and a small infarct in the left frontal lobe was  slightly more prominent.He is exercising daily. No seizures. He underwent scleral buckling OS for retinal detachment by Joel [name] and has left eye cataract to be followed by Joel [name]. Bone density 01/12/2012 was normal  UPDATE April 30th 2015:YYHe had a right eye cataract surgery in 2014, recovering very well,he has no recurrent seizures,he is now taking Keppra xr 750mg  bid, and Dilantin 100 mg 2 tablets twice a day. He wants to consider tapering off Dilantin. He still exercise daily, 2-3 hours daily.  UPDATE May 2nd 2016:YYHe continue to exercise, has taper of Dilantin now, taking Keppra xr 500 mg 2 tablets twice a day, no significant side effect  UPDATE 05/02/2017[name] for yearly follow-up for his seizure disorder. He has previously been followed by Joel [name] and Joel [name]. Records reviewed Now taking Keppra XR 750 mg twice daily without side effects. Last seizure activity March 2013. He continues to exercise twice a day. No new neurologic complaints he [name] for reevaluation UPDATE 04/26/2018[name], 71 year old male [name] for follow-up with history of seizure disorder. He also has a history of subdural hematoma in 2013 with evacuation. His last seizure event was in March 2013. He eats healthy He continues to exercise twice a day. He is currently on Keppra extended release 500 mg 3 tablets at bedtime. He denies any side effects to the medication. He has had no interval medical problems since last seen. He [name] for reevaluation   REVIEW OF SYSTEMS: Full 14 system review of systems performed and notable only for those listed, all others are neg:  Constitutional: neg  Cardiovascular: neg Ear/Nose/Throat: neg  Skin: neg Eyes: neg Respiratory: neg Gastroitestinal: neg  Hematology/Lymphatic: neg  Endocrine: neg Musculoskeletal:neg Allergy/Immunology: neg Neurological: neg Psychiatric: neg Sleep : neg   ALLERGIES: No Known Allergies  HOME  MEDICATIONS: Outpatient Medications Prior to Visit  Medication Sig Dispense Refill  . amLODipine-olmesartan (AZOR) 10-40 MG tablet Take 1 tablet by mouth daily. 90 tablet 2  . BROMFENAC SODIUM, ONCE-DAILY, OP Apply 1 drop to eye daily. Left eye only    . levETIRAcetam (KEPPRA XR) 500 MG 24 hr tablet Take 3 tablets (1,500 mg total) by mouth at bedtime. 270 tablet 3  . triamcinolone (NASACORT) 55 MCG/ACT AERO nasal inhaler Place 2 sprays into the nose daily.     No facility-administered medications prior to visit.     PAST MEDICAL HISTORY: Past Medical History:  Diagnosis Date  . Anemia    Dr. Paulita Fujita  . Back pain    HNP dx ~2008, no surgery  . Fall 05-2011    had a IC bleed   . Hearing loss    Rt ear sensorineural hearing loss, with hearing aide. W/u by Dr. Remer Macho extensively negative  . HTN (hypertension)   . Seizure disorder ([place])    Dx remotely, last SZ activity aprox 1998    PAST SURGICAL HISTORY: Past Surgical History:  Procedure Laterality Date  . CATARACT EXTRACTION Bilateral    R 2014, L 03-2013  . COLONOSCOPY  06/2015  . CRANIOTOMY  05/27/2011   Procedure: CRANIOTOMY HEMATOMA EVACUATION SUBDURAL;  Surgeon: Hosie Spangle, MD;  Location: [facility];  Service: Neurosurgery;  Laterality: Right;  Evacuation of Subdural Hematoma  . EYE SURGERY  11-2011    for retinal detachment , L, Dr Zadie [name]  . HERNIA REPAIR     as an infant  . WISDOM TOOTH EXTRACTION     x2, Upper    FAMILY HISTORY: Family History  Problem Relation Age of Onset  . Emphysema Mother     smoker  . Hypertension Father     F and B   . Leukemia Sister   . Prostate cancer Brother 82  . Diabetes Neg Hx   . Colon cancer Neg Hx   . Coronary artery disease Neg Hx     SOCIAL HISTORY: Social History   Social History  . Marital status: Single    Spouse name: N/A  . Number of children: 2  . Years of education: PHD   Occupational History  . Retired: Prof [employer]    Social History Main Topics   . Smoking status: Never Smoker  . Smokeless tobacco: Never Used  . Alcohol use No     Comment: not in  years  . Drug use: No  . Sexual activity: Not on file   Other Topics Concern  . Not on file   Social History Narrative   2 children, one in [place] another in [place]   Lives by himself   Patient is retired.   Education PHD    Right handed.   Caffeine Three cups of coffee daily.     PHYSICAL EXAM  Vitals:   07/07/16 1506  BP: (!) 142/78  Pulse: 63  Weight: 208 lb 9.6 oz (94.6 kg)   Body mass index is 28.29 kg/m.  Generalized: Well developed, in no acute distress  Head: normocephalic and atraumatic,. Oropharynx benign  Neck: Supple, no carotid bruits  Cardiac: Regular rate rhythm, no murmur  Musculoskeletal: No deformity   Neurological examination   Mentation: Alert oriented to time, place, history taking. Attention span and concentration appropriate. Recent and remote memory intact.  Follows all commands speech and language fluent.   Cranial nerve II-XII: Pupils were equal round reactive to light extraocular movements were full, visual field were full on confrontational test. Facial sensation and strength were normal. hearing was intact to finger rubbing bilaterally. Uvula tongue midline. head turning and shoulder shrug were normal and symmetric.Tongue protrusion into cheek strength was normal. Motor: normal bulk and tone, full strength in the BUE, BLE, fine finger movements normal, no pronator drift. No focal weakness Sensory: normal and symmetric to light touch, pinprick, and  Vibration, in the upper and lower extremities Coordination: finger-nose-finger, heel-to-shin bilaterally, no dysmetria Reflexes: Brachioradialis 2/2, biceps 2/2, triceps 2/2, patellar 2/2, Achilles 2/2, plantar responses were flexor bilaterally. Gait and Station: Rising up from seated position without assistance, normal stance,  moderate stride, good arm swing, smooth turning, able to  perform tiptoe, and heel walking without difficulty. Tandem gait is steady  DIAGNOSTIC DATA (LABS, IMAGING, TESTING) -     Component Value Date/Time   NA 141 07/08/2015 1422   K 4.1 07/08/2015 1422   CL 106 07/08/2015 1422   CO2 26 07/08/2015 1422   GLUCOSE 74 07/08/2015 1422   BUN 15 07/08/2015 1422   CREATININE 1.05 07/08/2015 1422   CREATININE 0.85 04/05/2013 1545   CALCIUM 9.8 07/08/2015 1422   PROT 7.3 07/08/2015 1422   ALBUMIN  4.3 07/08/2015 1422   AST 27 07/08/2015 1422   ALT 20 07/08/2015 1422   ALKPHOS 87 07/08/2015 1422   BILITOT 1.1 07/08/2015 1422   GFRNONAA 89 (L) 06/09/2011 1249   GFRAA >90 06/09/2011 1249   Lab Results  Component Value Date   CHOL 189 07/08/2015   HDL 56.10 07/08/2015   LDLCALC 121 (H) 07/08/2015   LDLDIRECT 109.4 04/05/2012   TRIG 63.0 07/08/2015   CHOLHDL 3 07/08/2015    ASSESSMENT AND PLAN  71 y.o. year old male  has a past medical history of Anemia; Back pain; Fall (05-2011 ); Hearing loss; HTN (hypertension); and Seizure disorder ([place]). And history of bilateral subdural hematomas with craniotomy here to follow-up for seizure disorder that is well controlled.  PLAN: Continue Keppra XR  at current dose will refill Call for any seizure activity Follow up yearly  I spent 15 min in total face to face time with the patient more than 50% of which was spent counseling and coordination of care, reviewing test results reviewing medications and discussing and reviewing the diagnosis of seizure disorder. Answered questions about missed doses of Keppra and how to take the medication at that point. , Rayburn Ma, [facility], APRN  [facility] [address] [place], [place] [address] [phone]

## 2016-07-08 NOTE — Telephone Encounter (Signed)
.    This was opened in error.

## 2016-07-08 NOTE — Progress Notes (Signed)
Pre visit review using our clinic review tool, if applicable. No additional management support is needed unless otherwise documented below in the visit note. 

## 2016-07-08 NOTE — Progress Notes (Signed)
Subjective:   Joel [name] is a 71 y.o. male who presents for Medicare Annual/Subsequent preventive examination.  Review of Systems:  No ROS.  Medicare Wellness Visit.  Cardiac Risk Factors include: advanced age (>22men, >49 women);hypertension;male gender  Sleep patterns: no sleep issues.   Home Safety/Smoke Alarms: Feels safe in home. Smoke alarms in place.  Living environment; residence and Firearm Safety: Lives w/ son. 1-story house/ trailer, firearms stored safely. Seat Belt Safety/Bike Helmet: Wears seat belt.    Counseling:   Eye Exam- Follows w/ Dr. Zadie Rhine and Dr. Delman Cheadle.  Dental- Dr. Gerlene Burdock yearly  Male:   CCS- last 06/24/15. Internal hemorrhoids, otherwise normal. 5 year recall.    PSA-  Lab Results  Component Value Date   PSA 2.00 07/08/2015   PSA 1.89 05/30/2014   PSA 0.89 04/05/2012       Objective:    Vitals: BP 136/78   Pulse 67   Resp 16   Ht 6' (1.829 m)   Wt 207 lb (93.9 kg)   SpO2 98%   BMI 28.07 kg/m   Body mass index is 28.07 kg/m.  Wt Readings from Last 3 Encounters:  07/11/16 207 lb (93.9 kg)  07/07/16 208 lb 9.6 oz (94.6 kg)  07/14/15 194 lb 6.4 oz (88.2 kg)    Tobacco History  Smoking Status  . Never Smoker  Smokeless Tobacco  . Never Used     Counseling given: Not Answered   Past Medical History:  Diagnosis Date  . Anemia    Dr. Paulita Fujita  . Back pain    HNP dx ~2008, no surgery  . Fall 05-2011    had a IC bleed   . Hearing loss    Rt ear sensorineural hearing loss, with hearing aide. W/u by Dr. Remer Macho extensively negative  . HTN (hypertension)   . Seizure disorder ([place])    Dx remotely, last SZ activity aprox 1998  . 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 CATARACT EXTRACTION Bilateral    R 2014, L 03-2013  . COLONOSCOPY  06/2015  . CRANIOTOMY  05/27/2011   Procedure: CRANIOTOMY HEMATOMA EVACUATION SUBDURAL;  Surgeon: Hosie Spangle, MD;  Location: [facility];   Service: Neurosurgery;  Laterality: Right;  Evacuation of Subdural Hematoma  . EYE SURGERY  11-2011    for retinal detachment , L, Dr Zadie Rhine  . HERNIA REPAIR     as an infant  . WISDOM TOOTH EXTRACTION     x2, Upper   Family History  Problem Relation Age of Onset  . Emphysema Mother     smoker  . Hypertension Father     F and B   . Leukemia Sister   . Prostate cancer Brother 98  . Diabetes Neg Hx   . Colon cancer Neg Hx   . Coronary artery disease Neg Hx    History  Sexual Activity  . Sexual activity: Not on file    Outpatient Encounter Prescriptions as of 07/11/2016  Medication Sig  . amLODipine-olmesartan (AZOR) 10-40 MG tablet Take 1 tablet by mouth daily.  Marland Kitchen BROMFENAC SODIUM, ONCE-DAILY, OP Apply 1 drop to eye daily. Left eye only  . levETIRAcetam (KEPPRA XR) 500 MG 24 hr tablet Take 3 tablets (1,500 mg total) by mouth at bedtime.  . [DISCONTINUED] triamcinolone (NASACORT) 55 MCG/ACT AERO nasal inhaler Place 2 sprays into the nose daily.   No facility-administered encounter medications on file as of 07/11/2016.  Activities of Daily Living In your present state of health, do you have any difficulty performing the following activities: 07/11/2016  Hearing? N  Vision? N  Difficulty concentrating or making decisions? N  Walking or climbing stairs? N  Dressing or bathing? N  Doing errands, shopping? N  Preparing Food and eating ? N  Using the Toilet? N  In the past six months, have you accidently leaked urine? N  Do you have problems with loss of bowel control? N  Managing your Medications? N  Managing your Finances? N  Housekeeping or managing your Housekeeping? N  Some recent data might be hidden    Patient Care Team: Colon Branch, MD as PCP - General Sharyne Peach, MD as Consulting Physician (Ophthalmology) Hurman Horn, MD as Consulting Physician (Ophthalmology) Marcial Pacas, MD as Consulting Physician (Neurology) Arta Silence, MD as Consulting Physician  (Gastroenterology)   Assessment:    Physical assessment deferred to PCP.  Exercise Activities and Dietary recommendations Current Exercise Habits: Structured exercise class, Type of exercise: strength training/weights;Other - see comments (cardio, spin class, core exercises), Frequency (Times/Week): 7  Diet (meal preparation, eat out, water intake, caffeinated beverages, dairy products, fruits and vegetables): Regular diet, tries to eat 'sensible everyday.' Does not eat fried food or red meat. Drinks lots of Gatorade/sports drinks. Eats lots of bananas. Trying to cut back on bread/white carbs. Tries to eat lots of fruits/veggies. Eats lots of salads. Breakfast: Cheerios w/ almond milk     Goals    . Healthy Lifestyle          Continue to eat heart healthy diet (full of fruits, vegetables, whole grains, lean protein, water--limit salt, fat, and sugar intake) and increase physical activity as tolerated. Continue doing brain stimulating activities (puzzles, reading, adult coloring books, staying active) to keep memory sharp.       Fall Risk Fall Risk  07/11/2016 07/08/2015 05/30/2014 04/05/2013 04/03/2012  Falls in the past year? No No No No Yes  Number falls in past yr: - - - - 1  Injury with Fall? - - - - Yes   Depression Screen PHQ 2/9 Scores 07/11/2016 07/08/2015 05/30/2014 04/05/2013  PHQ - 2 Score 0 0 0 0    Cognitive Function MMSE - Mini Mental State Exam 07/11/2016  Orientation to time 5  Orientation to Place 5  Registration 3  Attention/ Calculation 5  Recall 3  Language- name 2 objects 2  Language- repeat 1  Language- follow 3 step command 3  Language- read & follow direction 1  Write a sentence 1  Copy design 1  Total score 30        Immunization History  Administered Date(s) Administered  . Influenza Whole 02/12/2007, 01/09/2008, 12/24/2008, 12/17/2009, 01/20/2013  . Influenza-Unspecified 10/31/2015  . Pneumococcal Conjugate-13 05/30/2014, 10/31/2015  .  Pneumococcal Polysaccharide-23 04/03/2012  . Td 05/16/2007  . Zoster 05/04/2012   Screening Tests Health Maintenance  Topic Date Due  . INFLUENZA VACCINE  10/12/2016  . TETANUS/TDAP  05/15/2017  . COLONOSCOPY  06/30/2020  . Hepatitis C Screening  Completed  . PNA vac Low Risk Adult  Completed      Plan:    Follow-up w/ PCP as scheduled.  Create and bring a copy of your advance directives to your next office visit.  I have personally reviewed and noted the following in the patient's chart:   . Medical and social history . Use of alcohol, tobacco or illicit drugs  . Current medications and  supplements . Functional ability and status . Nutritional status . Physical activity . Advanced directives . List of other physicians . Vitals . Screenings to include cognitive, depression, and falls . Referrals and appointments  In addition, I have reviewed and discussed with patient certain preventive protocols, quality metrics, and best practice recommendations. A written personalized care plan for preventive services as well as general preventive health recommendations were provided to patient.     Dorrene German, RN  07/11/2016  Kathlene November, MD

## 2016-07-08 NOTE — Progress Notes (Signed)
I have reviewed and agreed above plan. 

## 2016-07-11 ENCOUNTER — Ambulatory Visit: Payer: Medicare Other | Admitting: *Deleted

## 2016-07-11 ENCOUNTER — Encounter: Payer: Self-pay | Admitting: Internal Medicine

## 2016-07-11 ENCOUNTER — Ambulatory Visit (INDEPENDENT_AMBULATORY_CARE_PROVIDER_SITE_OTHER): Payer: Medicare Other | Admitting: Internal Medicine

## 2016-07-11 VITALS — BP 136/78 | HR 67 | Resp 16 | Ht 72.0 in | Wt 207.0 lb

## 2016-07-11 DIAGNOSIS — D649 Anemia, unspecified: Secondary | ICD-10-CM

## 2016-07-11 DIAGNOSIS — Z125 Encounter for screening for malignant neoplasm of prostate: Secondary | ICD-10-CM

## 2016-07-11 DIAGNOSIS — Z0000 Encounter for general adult medical examination without abnormal findings: Secondary | ICD-10-CM

## 2016-07-11 DIAGNOSIS — R972 Elevated prostate specific antigen [PSA]: Secondary | ICD-10-CM

## 2016-07-11 DIAGNOSIS — Z1159 Encounter for screening for other viral diseases: Secondary | ICD-10-CM

## 2016-07-11 MED ORDER — AMLODIPINE-OLMESARTAN 10-40 MG PO TABS: 1.0000 | ORAL_TABLET | Freq: Every day | ORAL | 3 refills | Status: DC

## 2016-07-11 NOTE — Assessment & Plan Note (Addendum)
--  Td 09; Pneumonia shot-- 2014; prevnar-- 2016; zostavax-- 2014; shingrex discussed  --CCS: colonoscopy 2003, cscope   04-2012, tubular adenoma, again 06-2015 -- next 5 year per pt (GI  Dr Paulita Fujita) -- (+) FH prostate cancer, asx,DRE (-) today, check a PSA  --Labs: CMP, FLP, CBC, TSH, PSH, hep C, iron, ferritin --Continue with healthy lifestyle

## 2016-07-11 NOTE — Patient Instructions (Addendum)
GO TO THE LAB : Get the blood work     GO TO THE FRONT DESK Schedule your next appointment for a  physical exam in one year   Check the  blood pressure   monthly   Be sure your blood pressure is between 110/65 and  145/85. If it is consistently higher or lower, let me know    Create and bring a copy of your advance directives to your next office visit.     Preventive Care 13 Years and Older, Male Preventive care refers to lifestyle choices and visits with your health care provider that can promote health and wellness. What does preventive care include?  A yearly physical exam. This is also called an annual well check.  Dental exams once or twice a year.  Routine eye exams. Ask your health care provider how often you should have your eyes checked.  Personal lifestyle choices, including:  Daily care of your teeth and gums.  Regular physical activity.  Eating a healthy diet.  Avoiding tobacco and drug use.  Limiting alcohol use.  Practicing safe sex.  Taking low doses of aspirin every day.  Taking vitamin and mineral supplements as recommended by your health care provider. What happens during an annual well check? The services and screenings done by your health care provider during your annual well check will depend on your age, overall health, lifestyle risk factors, and family history of disease. Counseling  Your health care provider may ask you questions about your:  Alcohol use.  Tobacco use.  Drug use.  Emotional well-being.  Home and relationship well-being.  Sexual activity.  Eating habits.  History of falls.  Memory and ability to understand (cognition).  Work and work Statistician. Screening  You may have the following tests or measurements:  Height, weight, and BMI.  Blood pressure.  Lipid and cholesterol levels. These may be checked every 5 years, or more frequently if you are over 98 years old.  Skin check.  Lung cancer  screening. You may have this screening every year starting at age 43 if you have a 30-pack-year history of smoking and currently smoke or have quit within the past 15 years.  Fecal occult blood test (FOBT) of the stool. You may have this test every year starting at age 2.  Flexible sigmoidoscopy or colonoscopy. You may have a sigmoidoscopy every 5 years or a colonoscopy every 10 years starting at age 12.  Prostate cancer screening. Recommendations will vary depending on your family history and other risks.  Hepatitis C blood test.  Hepatitis B blood test.  Sexually transmitted disease (STD) testing.  Diabetes screening. This is done by checking your blood sugar (glucose) after you have not eaten for a while (fasting). You may have this done every 1-3 years.  Abdominal aortic aneurysm (AAA) screening. You may need this if you are a current or former smoker.  Osteoporosis. You may be screened starting at age 85 if you are at high risk. Talk with your health care provider about your test results, treatment options, and if necessary, the need for more tests. Vaccines  Your health care provider may recommend certain vaccines, such as:  Influenza vaccine. This is recommended every year.  Tetanus, diphtheria, and acellular pertussis (Tdap, Td) vaccine. You may need a Td booster every 10 years.  Varicella vaccine. You may need this if you have not been vaccinated.  Zoster vaccine. You may need this after age 34.  Measles, mumps, and rubella (MMR)  vaccine. You may need at least one dose of MMR if you were born in 1957 or later. You may also need a second dose.  Pneumococcal 13-valent conjugate (PCV13) vaccine. One dose is recommended after age 22.  Pneumococcal polysaccharide (PPSV23) vaccine. One dose is recommended after age 75.  Meningococcal vaccine. You may need this if you have certain conditions.  Hepatitis A vaccine. You may need this if you have certain conditions or if you  travel or work in places where you may be exposed to hepatitis A.  Hepatitis B vaccine. You may need this if you have certain conditions or if you travel or work in places where you may be exposed to hepatitis B.  Haemophilus influenzae type b (Hib) vaccine. You may need this if you have certain risk factors. Talk to your health care provider about which screenings and vaccines you need and how often you need them. This information is not intended to replace advice given to you by your health care provider. Make sure you discuss any questions you have with your health care provider. Document Released: 03/27/2015 Document Revised: 11/18/2015 Document Reviewed: 12/30/2014 Elsevier Interactive Patient Education  2017 [employer].

## 2016-07-11 NOTE — Progress Notes (Signed)
Subjective:    Patient ID: Joel [name], male    DOB: February 21, 1946, 71 y.o.   MRN: [id number]  DOS:  07/11/2016 Type of visit - description : cpx Interval history: No concerns, good compliance with medication, ambulatory BPs when checked in the 125 range   Review of Systems   A 14 point review of systems is negative   Past Medical History:  Diagnosis Date  . Anemia    Dr. Paulita Fujita  . Back pain    HNP dx ~2008, no surgery  . Fall 05-2011    had a IC bleed   . Hearing loss    Rt ear sensorineural hearing loss, with hearing aide. W/u by Dr. Remer Macho extensively negative  . HTN (hypertension)   . Seizure disorder ([place])    Dx remotely, last SZ activity aprox 1998  . 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 CATARACT EXTRACTION Bilateral    R 2014, L 03-2013  . CRANIOTOMY  05/27/2011   Procedure: CRANIOTOMY HEMATOMA EVACUATION SUBDURAL;  Surgeon: Hosie Spangle, MD;  Location: [facility];  Service: Neurosurgery;  Laterality: Right;  Evacuation of Subdural Hematoma  . EYE SURGERY  11-2011    for retinal detachment , L, Dr Zadie Rhine  . HERNIA REPAIR     as an infant  . WISDOM TOOTH EXTRACTION     x2, Upper    Social History   Social History  . Marital status: Single    Spouse name: N/A  . Number of children: 2  . Years of education: PHD   Occupational History  . Retired: Prof [employer]    Social History Main Topics  . Smoking status: Never Smoker  . Smokeless tobacco: Never Used  . Alcohol use No     Comment: not in  years  . Drug use: No  . Sexual activity: Not on file   Other Topics Concern  . Not on file   Social History Narrative   2 children, one in [place] w/ pt   Patient is retired.   Education PHD    Right handed.   Caffeine Three cups of coffee daily.     Family History  Problem Relation Age of Onset  . Emphysema Mother     smoker  . Hypertension Father     F and B   .  Leukemia Sister   . Prostate cancer Brother 11  . Diabetes Neg Hx   . Colon cancer Neg Hx   . Coronary artery disease Neg Hx      Allergies as of 07/11/2016   No Known Allergies     Medication List       Accurate as of 07/11/16 11:59 PM. Always use your most recent med list.          amLODipine-olmesartan 10-40 MG tablet Commonly known as:  AZOR Take 1 tablet by mouth daily.   BROMFENAC SODIUM (ONCE-DAILY) OP Apply 1 drop to eye daily. Left eye only   levETIRAcetam 500 MG 24 hr tablet Commonly known as:  KEPPRA XR Take 3 tablets (1,500 mg total) by mouth at bedtime.          Objective:   Physical Exam BP 136/78   Pulse 67   Resp 16   Ht 6' (1.829 m)   Wt 207 lb (93.9 kg)   SpO2 98%   BMI 28.07 kg/m   General:  Well developed, well nourished . NAD.  Neck: No  thyromegaly  HEENT:  Normocephalic . Face symmetric, atraumatic Lungs:  CTA B Normal respiratory effort, no intercostal retractions, no accessory muscle use. Heart: RRR,  no murmur.  No pretibial edema bilaterally  Abdomen:  Not distended, soft, non-tender. No rebound or rigidity.  Rectal:  External abnormalities: none. Normal sphincter tone. No rectal masses or tenderness.  No stools found Prostate: Prostate gland firm and smooth, no enlargement, nodularity, tenderness, mass, asymmetry or induration.  Skin: Exposed areas without rash. Not pale. Not jaundice Neurologic:  alert & oriented X3.  Speech normal, gait appropriate for age and unassisted Strength symmetric and appropriate for age.  Psych: Cognition and judgment appear intact.  Cooperative with normal attention span and concentration.  Behavior appropriate. No anxious or depressed appearing.    Assessment & Plan:    Assessment  HTN Seizure disorder, last activity 1998 R hearing loss, h/o ENT eval (-) Intracranial bleed due to fall 2013 Anemia -- Dr. Paulita Fujita H/o back pain, NPH, no surgery 2008 H/o   moles in the back, saw   Dermatology 2015, told ok, was not rec to schedule a RTC  Plan: HTN: Seems controlled, continue Azor, checking labs. Seizure disorder: Follow-up by neurology. No recent activity. Anemia: GI ROS negative. Recheck labs. He donates blood frequently q 6-8 weeks. RTC one year

## 2016-07-12 LAB — LIPID PANEL
CHOL/HDL RATIO: 3
CHOLESTEROL: 178 mg/dL (ref 0–200)
HDL: 59.4 mg/dL (ref >39.00)
LDL CALC: 107 mg/dL — AB (ref 0–99)
NonHDL: 118.84
TRIGLYCERIDES: 61 mg/dL (ref 0.0–149.0)
VLDL: 12.2 mg/dL (ref 0.0–40.0)

## 2016-07-12 LAB — TSH: TSH: 0.91 u[IU]/mL (ref 0.35–4.50)

## 2016-07-12 LAB — CBC WITH DIFFERENTIAL/PLATELET
BASOS ABS: 0.1 10*3/uL (ref 0.0–0.1)
BASOS PCT: 0.9 % (ref 0.0–3.0)
EOS ABS: 0.1 10*3/uL (ref 0.0–0.7)
Eosinophils Relative: 1.3 % (ref 0.0–5.0)
HEMATOCRIT: 44.3 % (ref 39.0–52.0)
Hemoglobin: 14.5 g/dL (ref 13.0–17.0)
LYMPHS ABS: 1.2 10*3/uL (ref 0.7–4.0)
Lymphocytes Relative: 16.5 % (ref 12.0–46.0)
MCHC: 32.6 g/dL (ref 30.0–36.0)
MCV: 96.2 fl (ref 78.0–100.0)
MONOS PCT: 10.7 % (ref 3.0–12.0)
Monocytes Absolute: 0.8 10*3/uL (ref 0.1–1.0)
NEUTROS ABS: 5.2 10*3/uL (ref 1.4–7.7)
NEUTROS PCT: 70.6 % (ref 43.0–77.0)
PLATELETS: 180 10*3/uL (ref 150.0–400.0)
RBC: 4.61 Mil/uL (ref 4.22–5.81)
RDW: 13.3 % (ref 11.5–15.5)
WBC: 7.4 10*3/uL (ref 4.0–10.5)

## 2016-07-12 LAB — COMPREHENSIVE METABOLIC PANEL
ALBUMIN: 4.2 g/dL (ref 3.5–5.2)
ALT: 18 U/L (ref 0–53)
AST: 26 U/L (ref 0–37)
Alkaline Phosphatase: 94 U/L (ref 39–117)
BUN: 11 mg/dL (ref 6–23)
CALCIUM: 9.6 mg/dL (ref 8.4–10.5)
CHLORIDE: 108 meq/L (ref 96–112)
CO2: 28 meq/L (ref 19–32)
Creatinine, Ser: 1.02 mg/dL (ref 0.40–1.50)
GFR: 93.04 mL/min (ref >60.00)
Glucose, Bld: 84 mg/dL (ref 70–99)
Potassium: 4.5 mEq/L (ref 3.5–5.1)
Sodium: 141 mEq/L (ref 135–145)
Total Bilirubin: 1.3 mg/dL — ABNORMAL HIGH (ref 0.2–1.2)
Total Protein: 7.2 g/dL (ref 6.0–8.3)

## 2016-07-12 LAB — IRON: Iron: 149 ug/dL (ref 42–165)

## 2016-07-12 LAB — FERRITIN: Ferritin: 15.2 ng/mL — ABNORMAL LOW (ref 22.0–322.0)

## 2016-07-12 LAB — PSA: PSA: 4.63 ng/mL — ABNORMAL HIGH (ref 0.10–4.00)

## 2016-07-12 LAB — HEPATITIS C ANTIBODY: HCV AB: NEGATIVE

## 2016-07-13 ENCOUNTER — Ambulatory Visit: Payer: Medicare Other | Admitting: Nurse Practitioner

## 2016-07-13 NOTE — Assessment & Plan Note (Signed)
HTN: Seems controlled, continue Azor, checking labs. Seizure disorder: Follow-up by neurology. No recent activity. Anemia: GI ROS negative. Recheck labs. He donates blood frequently q 6-8 weeks. RTC one year

## 2016-07-15 NOTE — Addendum Note (Signed)
Addended byDamita Dunnings D on: 07/15/2016 02:45 PM   Modules accepted: Orders

## 2017-01-26 ENCOUNTER — Telehealth: Payer: Self-pay | Admitting: Internal Medicine

## 2017-01-26 NOTE — Telephone Encounter (Signed)
Due for a PSA, please arrange

## 2017-01-26 NOTE — Telephone Encounter (Signed)
MyChart message sent to Pt. PSA pending.

## 2017-01-30 ENCOUNTER — Other Ambulatory Visit (INDEPENDENT_AMBULATORY_CARE_PROVIDER_SITE_OTHER): Payer: Medicare Other

## 2017-01-30 DIAGNOSIS — R972 Elevated prostate specific antigen [PSA]: Secondary | ICD-10-CM

## 2017-01-30 LAB — PSA: PSA: 4.02 ng/mL — ABNORMAL HIGH (ref 0.10–4.00)

## 2017-01-31 NOTE — Addendum Note (Signed)
Addended byDamita Dunnings D on: 01/31/2017 05:51 PM   Modules accepted: Orders

## 2017-03-14 DIAGNOSIS — C61 Malignant neoplasm of prostate: Secondary | ICD-10-CM

## 2017-03-14 HISTORY — DX: Malignant neoplasm of prostate: C61

## 2017-03-14 HISTORY — PX: PROSTATE BIOPSY: SHX241

## 2017-06-12 LAB — PSA: PSA: 3.97

## 2017-06-21 ENCOUNTER — Encounter: Payer: Self-pay | Admitting: Internal Medicine

## 2017-07-07 NOTE — Progress Notes (Signed)
[facility]  PATIENT: Joel [name] DOB: [birth year]   REASON FOR VISIT: Follow-up for seizure disorder HISTORY FROM: Patient    HISTORY OF PRESENT ILLNESS: [name] isa 72 years old right handed [place] American male with a history of seizures beginning since March of 1993. Previously patients of Dr. of Dr. Erling Cruz, last clinical visit was February 2014. He would get up in the middle of the night to go the bathroom and awakened the next morning in bed with his tongue biten. Evaluation in 1993 with sleep deprived EEG and CAT scan of the brain without and with contrast were normal. 2-D echocardiogram and Holter monitoring evaluation and treadmill were normal. He had a similar episode of blacking out in the middle of the night in May of 1993. In May of 1994 he was admitted to [facility]. At that time he had an episode during the daytime and had loss of consciousness with jerking of his arms and legs and bit his tongue without urinary incontinence. EEG was normal and he was placed on Dilantin since 1994. He developed transient elevated liver function tests with GGT 232. EEG 08/22/95 was normal. MRI study of the brain 05/21/96 showed a small left arachnoid cyst in the anterior temporal fossa, and questionable atrohy in the left hippocampus.  He had a seizure at his wife's funeral in March of 1998. He denies macropsia, micropsia, dj vu, strange odors or tastes.   Phenytoin level 06/23/09 was 9.0. DEXA scan 06/30/2009 revealed normal T. Scores. He goes to a fitness center 7 days per week doing cardio, strength training, and "spins". He can balance on the balls of his feet for a long time.  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 He is independent in activities of daily living. He denies macropsia,micropsia, strange odors or tastes. He had sudden right ear hearing loss in 07/2010 and underwent MRI study  07/2010 and 08/31/2010 which was unremarkable except for small arachnoid cyst in the anterior aspect of the left middle fossa .He underwent a course of prednisone without benefit. In mid December 2012 he got up in the middle of the night ,was on his toilet to void, stood up and next remembers awaking on the floor. He went back to bed and the next morning noted blood on his pillow from a posterior head injury. CT scan showed a small left frontal bleed, thought to represent a contrecoup injury. He was admitted to [facility] 02/25/11 and seen in consultation by Dr. Sherley Bounds, neurosurgeon He was followed as an outpatient. After repeat CT scan showed improvement he was allowed to return to the fitness center.   Mid February 2013 he had problems walking without headache. He fell striking his head 05/12/2011 and developed worsening gait with a festinating quality. 05/19/2011, he fell in the driveway. 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 He underwent craniotomy 3/15 by Dr. Jovita Gamma. He was discharged 06/02/2011.  After discharge he noted intermittent tingling in his left hand,face, and arm, lasting 30 seconds, 4 times per day.He had slurred speech with the episodes. CT scan 06/06/11 showed bilateral subdurals right greater than left of 10 mm with 5 mm right to left midline shift. There was no evidence of new hemorrhage.   06/09/2011, he had a witnessed generalized major motor seizure and was seen in the [place] with a phenytoin level of  approximate 8.Keppra 750 mg twice a day was added on since then. In combination with his Dilantin 100 mg 2 tablets twice a day. Repeat CT scan 5/13 showed SDHs had decreased and a density in the right frontal region of unknown etiology .CT scan 09/12/2011 showed resolution of tiny left SDH, interval decrease in the small, improving white matter edema in the right frontal lobe, and a small infarct in the left frontal lobe was  slightly more prominent.He is exercising daily. No seizures. He underwent scleral buckling OS for retinal detachment by Dr. Zadie Rhine and has left eye cataract to be followed by Dr. Katy Fitch. Bone density 01/12/2012 was normal  UPDATE April 30th 2015:YYHe had a right eye cataract surgery in 2014, recovering very well,he has no recurrent seizures,he is now taking Keppra xr 750mg  bid, and Dilantin 100 mg 2 tablets twice a day. He wants to consider tapering off Dilantin. He still exercise daily, 2-3 hours daily.  UPDATE May 2nd 2016:YYHe continue to exercise, has taper of Dilantin now, taking Keppra xr 500 mg 2 tablets twice a day, no significant side effect  UPDATE 05/02/2017CM Mr. Lowdermilk returns for yearly follow-up for his seizure disorder. He has previously been followed by Dr.Yan and Dr. Erling Cruz. Records reviewed Now taking Keppra XR 750 mg twice daily without side effects. Last seizure activity March 2013. He continues to exercise twice a day. No new neurologic complaints he returns for reevaluation UPDATE 04/26/2018CM Mr.Dethlefs, 72 year old male returns for follow-up with history of seizure disorder. He also has a history of subdural hematoma in 2013 with evacuation. His last seizure event was in March 2013. He eats healthy He continues to exercise twice a day. He is currently on Keppra extended release 500 mg 3 tablets at bedtime. He denies any side effects to the medication. He has had no interval medical problems since last seen. He returns for reevaluation UPDATE 4/29/2019CM Mr. Hinchliffe, 72 year old male returns for follow-up with history of seizure disorder.  He had a subdural hematoma in 2013 evacuation.  His last seizure occurred in March 2013.  He continues to exercise twice a day and eat healthy.  He is sleeping well at night.  He is currently on Keppra extended release without side effects.  He is due to have a prostate biopsy for elevated PSA soon.  No other interval medical issues.  He  returns for reevaluation  REVIEW OF SYSTEMS: Full 14 system review of systems performed and notable only for those listed, all others are neg:  Constitutional: neg  Cardiovascular: neg Ear/Nose/Throat: neg  Skin: neg Eyes: neg Respiratory: neg Gastroitestinal: neg  Hematology/Lymphatic: neg  Endocrine: neg Musculoskeletal:neg Allergy/Immunology: neg Neurological: History of seizure disorder  Psychiatric: neg Sleep : neg   ALLERGIES: No Known Allergies  HOME MEDICATIONS: Outpatient Medications Prior to Visit  Medication Sig Dispense Refill  . amLODipine-olmesartan (AZOR) 10-40 MG tablet Take 1 tablet by mouth daily. 90 tablet 3  . BROMFENAC SODIUM, ONCE-DAILY, OP Apply 1 drop to eye daily. Left eye only    . levETIRAcetam (KEPPRA XR) 500 MG 24 hr tablet Take 3 tablets (1,500 mg total) by mouth at bedtime. 270 tablet 3   No facility-administered medications prior to visit.     PAST MEDICAL HISTORY: Past Medical History:  Diagnosis Date  . Anemia    Dr. Paulita Fujita  . Back pain    HNP dx ~2008, no surgery  . Elevated PSA   . Fall 05-2011    had a IC bleed   .  Hearing loss    Rt ear sensorineural hearing loss, with hearing aide. W/u by Dr. Remer Macho extensively negative  . HTN (hypertension)   . Seizure disorder ([place])    Dx remotely, last SZ activity aprox 1998  . Subdural hematoma ([place]) 2013    PAST SURGICAL HISTORY: Past Surgical History:  Procedure Laterality Date  . CATARACT EXTRACTION Bilateral    R 2014, L 03-2013  . CRANIOTOMY  05/27/2011   Procedure: CRANIOTOMY HEMATOMA EVACUATION SUBDURAL;  Surgeon: Hosie Spangle, MD;  Location: [facility];  Service: Neurosurgery;  Laterality: Right;  Evacuation of Subdural Hematoma  . EYE SURGERY  11-2011    for retinal detachment , L, Dr Zadie Rhine  . HERNIA REPAIR     as an infant  . WISDOM TOOTH EXTRACTION     x2, Upper    FAMILY HISTORY: Family History  Problem Relation Age of Onset  . Emphysema Mother         smoker  . Hypertension Father        F and B   . Leukemia Sister   . Prostate cancer Brother 10  . Diabetes Neg Hx   . Colon cancer Neg Hx   . Coronary artery disease Neg Hx     SOCIAL HISTORY: Social History   Socioeconomic History  . Marital status: Single    Spouse name: Not on file  . Number of children: 2  . Years of education: PHD  . Highest education level: Not on file  Occupational History  . Occupation: Retired: Prof [employer]  Social Needs  . Financial resource strain: Not on file  . Food insecurity:    Worry: Not on file    Inability: Not on file  . Transportation needs:    Medical: Not on file    Non-medical: Not on file  Tobacco Use  . Smoking status: Never Smoker  . Smokeless tobacco: Never Used  Substance and Sexual Activity  . Alcohol use: No    Alcohol/week: 0.0 oz    Comment: not in  years  . Drug use: No  . Sexual activity: Not on file  Lifestyle  . Physical activity:    Days per week: Not on file    Minutes per session: Not on file  . Stress: Not on file  Relationships  . Social connections:    Talks on phone: Not on file    Gets together: Not on file    Attends religious service: Not on file    Active member of club or organization: Not on file    Attends meetings of clubs or organizations: Not on file    Relationship status: Not on file  . Intimate partner violence:    Fear of current or ex partner: Not on file    Emotionally abused: Not on file    Physically abused: Not on file    Forced sexual activity: Not on file  Other Topics Concern  . Not on file  Social History Narrative   2 children, one in [place] w/ pt   Patient is retired.   Education PHD    Right handed.   Caffeine Three cups of coffee daily.     PHYSICAL EXAM  Vitals:   07/10/17 1509  BP: 124/77  Pulse: 64  Weight: 208 lb 9.6 oz (94.6 kg)  Height: 6' (1.829 m)   Body mass index is 28.29 kg/m.  Generalized: Well developed, in no acute  distress  Head: normocephalic and atraumatic,. Oropharynx benign  Neck: Supple,  Musculoskeletal: No deformity   Neurological examination   Mentation: Alert oriented to time, place, history taking. Attention span and concentration appropriate. Recent and remote memory intact.  Follows all commands speech and language fluent.   Cranial nerve II-XII: Pupils were equal round reactive to light extraocular movements were full, visual field were full on confrontational test. Facial sensation and strength were normal. hearing was intact to finger rubbing bilaterally. Uvula tongue midline. head turning and shoulder shrug were normal and symmetric.Tongue protrusion into cheek strength was normal. Motor: normal bulk and tone, full strength in the BUE, BLE,  Sensory: normal and symmetric to light touch, pinprick, and  Vibration, in the upper and lower extremities Coordination: finger-nose-finger, heel-to-shin bilaterally, no dysmetria Reflexes: Brachioradialis 2/2, biceps 2/2, triceps 2/2, patellar 2/2, Achilles 2/2, plantar responses were flexor bilaterally. Gait and Station: Rising up from seated position without assistance, normal stance,  moderate stride, good arm swing, smooth turning, able to perform tiptoe, and heel walking without difficulty. Tandem gait is steady  DIAGNOSTIC DATA (LABS, IMAGING, TESTING) -     Component Value Date/Time   NA 141 07/11/2016 1623   K 4.5 07/11/2016 1623   CL 108 07/11/2016 1623   CO2 28 07/11/2016 1623   GLUCOSE 84 07/11/2016 1623   BUN 11 07/11/2016 1623   CREATININE 1.02 07/11/2016 1623   CREATININE 0.85 04/05/2013 1545   CALCIUM 9.6 07/11/2016 1623   PROT 7.2 07/11/2016 1623   ALBUMIN 4.2 07/11/2016 1623   AST 26 07/11/2016 1623   ALT 18 07/11/2016 1623   ALKPHOS 94 07/11/2016 1623   BILITOT 1.3 (H) 07/11/2016 1623   GFRNONAA 89 (L) 06/09/2011 1249   GFRAA >90 06/09/2011 1249   Lab Results  Component Value Date   CHOL 178 07/11/2016   HDL 59.40  07/11/2016   LDLCALC 107 (H) 07/11/2016   LDLDIRECT 109.4 04/05/2012   TRIG 61.0 07/11/2016   CHOLHDL 3 07/11/2016    ASSESSMENT AND PLAN  72 y.o. year old male  has a past medical history of Anemia; Back pain; Fall (05-2011 ); Hearing loss; HTN (hypertension); and Seizure disorder ([place]). And history of bilateral subdural hematomas with craniotomy here to follow-up for seizure disorder that is well controlled.  PLAN: Continue Keppra XR  at current dose will refill Call for any seizure activity Follow up yearly  I spent 15 min in total face to face time with the patient more than 50% of which was spent counseling and coordination of care, reviewing test results reviewing medications and discussing and reviewing the diagnosis of seizure disorder. Answered questions about missed doses of Keppra and how to take the medication at that point.  Discussed common seizure triggers, Dennie Bible, [facility], [facility], APRN  [facility] [address] [place], [place] [address] [phone]

## 2017-07-10 ENCOUNTER — Encounter: Payer: Self-pay | Admitting: Nurse Practitioner

## 2017-07-10 ENCOUNTER — Ambulatory Visit: Payer: Medicare Other | Admitting: Nurse Practitioner

## 2017-07-10 VITALS — BP 124/77 | HR 64 | Ht 72.0 in | Wt 208.6 lb

## 2017-07-10 DIAGNOSIS — G40909 Epilepsy, unspecified, not intractable, without status epilepticus: Secondary | ICD-10-CM | POA: Diagnosis not present

## 2017-07-10 MED ORDER — LEVETIRACETAM ER 500 MG PO TB24: 1500.0000 mg | ORAL_TABLET | Freq: Every day | ORAL | 3 refills | Status: DC

## 2017-07-10 NOTE — Patient Instructions (Signed)
Continue Keppra XR  at current dose will refill Call for any seizure activity Follow up yearly

## 2017-07-11 NOTE — Progress Notes (Signed)
I have reviewed and agreed above plan. 

## 2017-07-14 ENCOUNTER — Encounter: Payer: Medicare Other | Admitting: Internal Medicine

## 2017-07-14 ENCOUNTER — Ambulatory Visit: Payer: Medicare Other | Admitting: *Deleted

## 2017-07-19 NOTE — Progress Notes (Addendum)
Subjective:   Joel [name] is a 72 y.o. male who presents for Medicare Annual/Subsequent preventive examination.  Review of Systems: No ROS.  Medicare Wellness Visit. Additional risk factors are reflected in the social history. Cardiac Risk Factors include: advanced age (>39men, >55 women);hypertension;male gender Sleep patterns: Sleeps 7 hrs. Feels rested. Home Safety/Smoke Alarms: Feels safe in home. Smoke alarms in place.  Living environment; residence and Firearm Safety: 1 story home. Son lives with pt.   Male:   CCS-  Due 2022   PSA-  Lab Results  Component Value Date   PSA 3.97 06/12/2017   PSA 4.02 (H) 01/30/2017   PSA 4.63 (H) 07/11/2016       Objective:    Vitals: BP 135/76 (BP Location: Left Arm, Patient Position: Sitting, Cuff Size: Normal)   Pulse 76   Ht 5\' 11"  (1.803 m)   Wt 205 lb 6.4 oz (93.2 kg)   SpO2 98%   BMI 28.65 kg/m   Body mass index is 28.65 kg/m.  Advanced Directives 07/21/2017 07/11/2016 05/28/2011 05/27/2011 02/25/2011  Does Patient Have a Medical Advance Directive? No No Patient does not have advance directive Patient does not have advance directive Patient does not have advance directive;Patient would not like information  Does patient want to make changes to medical advance directive? - Yes (MAU/Ambulatory/Procedural Areas - Information given) - - -  Would patient like information on creating a medical advance directive? Yes (MAU/Ambulatory/Procedural Areas - Information given) - - - -  Pre-existing out of facility DNR order (yellow form or pink MOST form) - - No No No    Tobacco Social History   Tobacco Use  Smoking Status Never Smoker  Smokeless Tobacco Never Used     Counseling given: Not Answered   Clinical Intake: Pain : No/denies pain     Past Medical History:  Diagnosis Date  . Anemia    Dr. Paulita Fujita  . Back pain    HNP dx ~2008, no surgery  . Elevated PSA   . Fall 05-2011    had a IC bleed   . Hearing loss    Rt ear sensorineural hearing loss, with hearing aide. W/u by Dr. Remer Macho extensively negative  . HTN (hypertension)   . Seizure disorder ([place])    Dx remotely, last SZ activity aprox 1998  . Subdural hematoma ([place]) 2013   Past Surgical History:  Procedure Laterality Date  . CATARACT EXTRACTION Bilateral    R 2014, L 03-2013  . CRANIOTOMY  05/27/2011   Procedure: CRANIOTOMY HEMATOMA EVACUATION SUBDURAL;  Surgeon: Hosie Spangle, MD;  Location: [facility];  Service: Neurosurgery;  Laterality: Right;  Evacuation of Subdural Hematoma  . EYE SURGERY  11-2011    for retinal detachment , L, Dr Zadie Rhine  . HERNIA REPAIR     as an infant  . WISDOM TOOTH EXTRACTION     x2, Upper   Family History  Problem Relation Age of Onset  . Emphysema Mother        smoker  . Hypertension Father        F and B   . Leukemia Sister   . Prostate cancer Brother 40  . Diabetes Neg Hx   . Colon cancer Neg Hx   . Coronary artery disease Neg Hx    Social History   Socioeconomic History  . Marital status: Single    Spouse name: Not on file  . Number of children: 2  . Years of education:  PHD  . Highest education level: Not on file  Occupational History  . Occupation: Retired: Prof [employer]  Social Needs  . Financial resource strain: Not on file  . Food insecurity:    Worry: Not on file    Inability: Not on file  . Transportation needs:    Medical: Not on file    Non-medical: Not on file  Tobacco Use  . Smoking status: Never Smoker  . Smokeless tobacco: Never Used  Substance and Sexual Activity  . Alcohol use: No    Alcohol/week: 0.0 oz    Comment: not in  years  . Drug use: No  . Sexual activity: Not Currently  Lifestyle  . Physical activity:    Days per week: Not on file    Minutes per session: Not on file  . Stress: Not on file  Relationships  . Social connections:    Talks on phone: Not on file    Gets together: Not on file    Attends religious service: Not on file    Active member  of club or organization: Not on file    Attends meetings of clubs or organizations: Not on file    Relationship status: Not on file  Other Topics Concern  . Not on file  Social History Narrative   2 children, one in [place] w/ pt   Patient is retired.   Education PHD    Right handed.   Caffeine Three cups of coffee daily.    Outpatient Encounter Medications as of 07/21/2017  Medication Sig  . amLODipine-olmesartan (AZOR) 10-40 MG tablet Take 1 tablet by mouth daily.  Marland Kitchen BROMFENAC SODIUM, ONCE-DAILY, OP Apply 1 drop to eye daily. Left eye only  . levETIRAcetam (KEPPRA XR) 500 MG 24 hr tablet Take 3 tablets (1,500 mg total) by mouth at bedtime.  . [DISCONTINUED] calcium carbonate (CALCIUM 500) 1250 MG tablet Take 1 tablet by mouth daily.   . [DISCONTINUED] pantoprazole (PROTONIX) 20 MG tablet Take 1 tablet (20 mg total) by mouth daily.   No facility-administered encounter medications on file as of 07/21/2017.     Activities of Daily Living In your present state of health, do you have any difficulty performing the following activities: 07/21/2017  Hearing? Y  Comment has hearing aids. does not wear them.   Vision? N  Difficulty concentrating or making decisions? N  Walking or climbing stairs? N  Dressing or bathing? N  Doing errands, shopping? N  Preparing Food and eating ? N  Using the Toilet? N  In the past six months, have you accidently leaked urine? N  Do you have problems with loss of bowel control? N  Managing your Medications? N  Managing your Finances? N  Housekeeping or managing your Housekeeping? N  Some recent data might be hidden    Patient Care Team: Colon Branch, MD as PCP - General Sharyne Peach, MD as Consulting Physician (Ophthalmology) Zadie Rhine Clent Demark, MD as Consulting Physician (Ophthalmology) Marcial Pacas, MD as Consulting Physician (Neurology) Arta Silence, MD as Consulting Physician (Gastroenterology) Alexis Frock, MD as  Consulting Physician (Urology)   Assessment:   This is a routine wellness examination for Joel [name]. Physical assessment deferred to PCP.  Exercise Activities and Dietary recommendations Current Exercise Habits: Structured exercise class, Type of exercise: treadmill;strength training/weights;walking;calisthenics, Time (Minutes): > 60, Frequency (Times/Week): 7, Weekly Exercise (Minutes/Week): 0, Intensity: Mild, Exercise limited by: None identified Diet (meal preparation, eat out, water intake, caffeinated  beverages, dairy products, fruits and vegetables): in general, a "healthy" diet  , well balanced     Goals    . Weight (lb) < 200 lb (90.7 kg)     With diet and exercise        Fall Risk Fall Risk  07/21/2017 07/11/2016 07/08/2015 05/30/2014 04/05/2013  Falls in the past year? No No No No No  Number falls in past yr: - - - - -  Injury with Fall? - - - - -    Depression Screen PHQ 2/9 Scores 07/21/2017 07/11/2016 07/08/2015 05/30/2014  PHQ - 2 Score 0 0 0 0    Cognitive Function Ad8 score reviewed for issues:  Issues making decisions:no  Less interest in hobbies / activities:no  Repeats questions, stories (family complaining):no  Trouble using ordinary gadgets (microwave, computer, phone):no  Forgets the month or year: no  Mismanaging finances: no  Remembering appts:no Daily problems with thinking and/or memory:no Ad8 score is=0   MMSE - Mini Mental State Exam 07/11/2016  Orientation to time 5  Orientation to Place 5  Registration 3  Attention/ Calculation 5  Recall 3  Language- name 2 objects 2  Language- repeat 1  Language- follow 3 step command 3  Language- read & follow direction 1  Write a sentence 1  Copy design 1  Total score 30        Immunization History  Administered Date(s) Administered  . Influenza Whole 02/12/2007, 01/09/2008, 12/24/2008, 12/17/2009, 01/20/2013  . Influenza-Unspecified 10/31/2015, 01/10/2017  . Pneumococcal Conjugate-13  05/30/2014, 10/31/2015  . Pneumococcal Polysaccharide-23 04/03/2012  . Td 05/16/2007  . Zoster 05/04/2012   Screening Tests Health Maintenance  Topic Date Due  . TETANUS/TDAP  05/15/2017  . INFLUENZA VACCINE  10/12/2017  . COLONOSCOPY  06/23/2020  . Hepatitis C Screening  Completed  . PNA vac Low Risk Adult  Completed       Plan:  Follow up w/ PCP today as scheduled  Continue to eat heart healthy diet (full of fruits, vegetables, whole grains, lean protein, water--limit salt, fat, and sugar intake) and increase physical activity as tolerated.  Continue doing brain stimulating activities (puzzles, reading, adult coloring books, staying active) to keep memory sharp.     I have personally reviewed and noted the following in the patient's chart:   . Medical and social history . Use of alcohol, tobacco or illicit drugs  . Current medications and supplements . Functional ability and status . Nutritional status . Physical activity . Advanced directives . List of other physicians . Hospitalizations, surgeries, and ER visits in previous 12 months . Vitals . Screenings to include cognitive, depression, and falls . Referrals and appointments  In addition, I have reviewed and discussed with patient certain preventive protocols, quality metrics, and best practice recommendations. A written personalized care plan for preventive services as well as general preventive health recommendations were provided to patient.     Joel [name] [place], [place]  07/21/2017  Kathlene November, MD

## 2017-07-21 ENCOUNTER — Ambulatory Visit (INDEPENDENT_AMBULATORY_CARE_PROVIDER_SITE_OTHER): Payer: Medicare Other | Admitting: *Deleted

## 2017-07-21 ENCOUNTER — Encounter: Payer: Self-pay | Admitting: *Deleted

## 2017-07-21 ENCOUNTER — Encounter: Payer: Self-pay | Admitting: Internal Medicine

## 2017-07-21 ENCOUNTER — Ambulatory Visit (INDEPENDENT_AMBULATORY_CARE_PROVIDER_SITE_OTHER): Payer: Medicare Other | Admitting: Internal Medicine

## 2017-07-21 VITALS — BP 135/76 | HR 76 | Ht 71.0 in | Wt 205.4 lb

## 2017-07-21 DIAGNOSIS — D649 Anemia, unspecified: Secondary | ICD-10-CM | POA: Insufficient documentation

## 2017-07-21 DIAGNOSIS — Z0000 Encounter for general adult medical examination without abnormal findings: Secondary | ICD-10-CM

## 2017-07-21 NOTE — Assessment & Plan Note (Addendum)
--  Td 09; Pneumonia shot 2014; prevnar 2016; zostavax-- 2014; shingrex not available   --CCS: colonoscopy 2003, cscope   04-2012, tubular adenoma, again 06-2015 -- next 5 year per pt (GI  Dr Paulita Fujita) -- (+) FH prostate cancer, elevated PSA , bx schedule for later this month.  We had extensive discussion about the procedure, pros/cons.  He also had the same conversation with the urologist and is agreeable to proceed. --Labs: CMP, FLP, CBC, iron, ferritin --Continue with healthy lifestyle

## 2017-07-21 NOTE — Progress Notes (Signed)
Subjective:    Patient ID: Joel [name], male    DOB: 1945/06/15, 72 y.o.   MRN: [id number]  DOS:  07/21/2017 Type of visit - description : CPX  interval history: Since last year, his PSA remain elevated, saw urology, to have a biopsy soon.  He remains asymptomatic.   Review of Systems Specifically denies chest pain, difficulty breathing; no dysuria/gross hematuria/difficulty urinating  Other than above, a 14 point review of systems is negative     Past Medical History:  Diagnosis Date  . Anemia    Dr. Paulita Fujita  . Back pain    HNP dx ~2008, no surgery  . Elevated PSA   . Fall 05-2011    had a IC bleed   . Hearing loss    Rt ear sensorineural hearing loss, with hearing aide. W/u by Dr. Remer Macho extensively negative  . HTN (hypertension)   . Seizure disorder ([place])    Dx remotely, last SZ activity aprox 1998  . 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 CATARACT EXTRACTION Bilateral    R 2014, L 03-2013  . CRANIOTOMY  05/27/2011   Procedure: CRANIOTOMY HEMATOMA EVACUATION SUBDURAL;  Surgeon: Hosie Spangle, MD;  Location: [facility];  Service: Neurosurgery;  Laterality: Right;  Evacuation of Subdural Hematoma  . EYE SURGERY  11-2011    for retinal detachment , L, Dr Zadie Rhine  . HERNIA REPAIR     as an infant  . WISDOM TOOTH EXTRACTION     x2, Upper   Family History  Problem Relation Age of Onset  . Emphysema Mother        smoker  . Hypertension Father        F and B   . Leukemia Sister   . Prostate cancer Brother 21  . Diabetes Neg Hx   . Colon cancer Neg Hx   . Coronary artery disease Neg Hx      Social History   Socioeconomic History  . Marital status: Single    Spouse name: Not on file  . Number of children: 2  . Years of education: PHD  . Highest education level: Not on file  Occupational History  . Occupation: Retired: Prof [employer]  Social Needs  . Financial resource strain: Not on file  . Food  insecurity:    Worry: Not on file    Inability: Not on file  . Transportation needs:    Medical: Not on file    Non-medical: Not on file  Tobacco Use  . Smoking status: Never Smoker  . Smokeless tobacco: Never Used  Substance and Sexual Activity  . Alcohol use: No    Alcohol/week: 0.0 oz    Comment: not in  years  . Drug use: No  . Sexual activity: Not Currently  Lifestyle  . Physical activity:    Days per week: Not on file    Minutes per session: Not on file  . Stress: Not on file  Relationships  . Social connections:    Talks on phone: Not on file    Gets together: Not on file    Attends religious service: Not on file    Active member of club or organization: Not on file    Attends meetings of clubs or organizations: Not on file    Relationship status: Not on file  . Intimate partner violence:    Fear of current or ex partner: Not on file  Emotionally abused: Not on file    Physically abused: Not on file    Forced sexual activity: Not on file  Other Topics Concern  . Not on file  Social History Narrative   2 children, one in [place] w/ pt   Patient is retired.   Education PHD    Right handed.   Caffeine Three cups of coffee daily.      Allergies as of 07/21/2017   No Known Allergies     Medication List        Accurate as of 07/21/17 11:59 PM. Always use your most recent med list.          amLODipine-olmesartan 10-40 MG tablet Commonly known as:  AZOR Take 1 tablet by mouth daily.   BROMFENAC SODIUM (ONCE-DAILY) OP Apply 1 drop to eye daily. Left eye only   levETIRAcetam 500 MG 24 hr tablet Commonly known as:  KEPPRA XR Take 3 tablets (1,500 mg total) by mouth at bedtime.          Objective:   Physical Exam BP 135/76 (BP Location: Left Arm, Patient Position: Sitting, Cuff Size: Normal)   Pulse 76   Ht 5\' 11"  (1.803 m)   Wt 205 lb 6 oz (93.2 kg)   SpO2 98%   BMI 28.64 kg/m  General:   Well developed, well nourished  . NAD.  Neck: No  thyromegaly  HEENT:  Normocephalic . Face symmetric, atraumatic Lungs:  CTA B Normal respiratory effort, no intercostal retractions, no accessory muscle use. Heart: RRR,  no murmur.  No pretibial edema bilaterally  Abdomen:  Not distended, soft, non-tender. No rebound or rigidity.   Skin: Exposed areas without rash. Not pale. Not jaundice Neurologic:  alert & oriented X3.  Speech normal, gait appropriate for age and unassisted Strength symmetric and appropriate for age.  Psych: Cognition and judgment appear intact.  Cooperative with normal attention span and concentration.  Behavior appropriate. No anxious or depressed appearing.     Assessment & Plan:   Assessment  HTN Seizure disorder, last activity 1998 R hearing loss, h/o ENT eval (-) Intracranial bleed due to fall 2013 Anemia -- Dr. Paulita Fujita H/o retina detachment  H/o back pain, NPH, no surgery 2008 H/o   moles in the back, saw  Dermatology 2015, told ok, was not rec to schedule a RTC  Plan: HTN: Seems well controlled, continue Azor.  Checking labs Seizure disorder: Well-controlled on Keppra Anemia: History of anemia, last hemoglobin normal, ferritin slightly low suspect d/t blood donation; , he donates blood multiple times a year which is commendable however recommend to limit donation to twice a year and take iron supplements. RTC 1 year

## 2017-07-21 NOTE — Patient Instructions (Signed)
GO TO THE LAB : Get the blood work     GO TO THE FRONT DESK Schedule your next appointment   for a physical exam in 1 year 

## 2017-07-21 NOTE — Progress Notes (Signed)
Pre visit review using our clinic review tool, if applicable. No additional management support is needed unless otherwise documented below in the visit note. 

## 2017-07-21 NOTE — Patient Instructions (Addendum)
  Continue to eat heart healthy diet (full of fruits, vegetables, whole grains, lean protein, water--limit salt, fat, and sugar intake) and increase physical activity as tolerated.  Continue doing brain stimulating activities (puzzles, reading, adult coloring books, staying active) to keep memory sharp.    Joel [name] , Thank you for taking time to come for your Medicare Wellness Visit. I appreciate your ongoing commitment to your health goals. Please review the following plan we discussed and let me know if I can assist you in the future.   These are the goals we discussed: Goals    . Weight (lb) < 200 lb (90.7 kg)     With diet and exercise        This is a list of the screening recommended for you and due dates:  Health Maintenance  Topic Date Due  . Tetanus Vaccine  05/15/2017  . Flu Shot  10/12/2017  . Colon Cancer Screening  06/23/2020  .  Hepatitis C: One time screening is recommended by Center for Disease Control  (CDC) for  adults born from [birth year] through 1963.   Completed  . Pneumonia vaccines  Completed

## 2017-07-22 LAB — CBC WITH DIFFERENTIAL/PLATELET
BASOS ABS: 38 {cells}/uL (ref 0–200)
Basophils Relative: 0.5 %
Eosinophils Absolute: 91 cells/uL (ref 15–500)
Eosinophils Relative: 1.2 %
HEMATOCRIT: 39.5 % (ref 38.5–50.0)
Hemoglobin: 13.2 g/dL (ref 13.2–17.1)
LYMPHS ABS: 1406 {cells}/uL (ref 850–3900)
MCH: 30.4 pg (ref 27.0–33.0)
MCHC: 33.4 g/dL (ref 32.0–36.0)
MCV: 91 fL (ref 80.0–100.0)
MPV: 11.1 fL (ref 7.5–12.5)
Monocytes Relative: 11.3 %
NEUTROS PCT: 68.5 %
Neutro Abs: 5206 cells/uL (ref 1500–7800)
Platelets: 195 10*3/uL (ref 140–400)
RBC: 4.34 10*6/uL (ref 4.20–5.80)
RDW: 11.8 % (ref 11.0–15.0)
Total Lymphocyte: 18.5 %
WBC mixed population: 859 cells/uL (ref 200–950)
WBC: 7.6 10*3/uL (ref 3.8–10.8)

## 2017-07-22 LAB — LIPID PANEL
CHOLESTEROL: 188 mg/dL (ref <200)
HDL: 48 mg/dL (ref >40)
LDL Cholesterol (Calc): 118 mg/dL (calc) — ABNORMAL HIGH
Non-HDL Cholesterol (Calc): 140 mg/dL (calc) — ABNORMAL HIGH (ref <130)
TRIGLYCERIDES: 112 mg/dL (ref <150)
Total CHOL/HDL Ratio: 3.9 (calc) (ref <5.0)

## 2017-07-22 LAB — COMPREHENSIVE METABOLIC PANEL
AG Ratio: 1.4 (calc) (ref 1.0–2.5)
ALBUMIN MSPROF: 4.2 g/dL (ref 3.6–5.1)
ALT: 16 U/L (ref 9–46)
AST: 26 U/L (ref 10–35)
Alkaline phosphatase (APISO): 97 U/L (ref 40–115)
BILIRUBIN TOTAL: 0.6 mg/dL (ref 0.2–1.2)
BUN: 14 mg/dL (ref 7–25)
CO2: 24 mmol/L (ref 20–32)
CREATININE: 0.96 mg/dL (ref 0.70–1.18)
Calcium: 9.8 mg/dL (ref 8.6–10.3)
Chloride: 107 mmol/L (ref 98–110)
Globulin: 3 g/dL (calc) (ref 1.9–3.7)
Glucose, Bld: 79 mg/dL (ref 65–99)
POTASSIUM: 4.8 mmol/L (ref 3.5–5.3)
SODIUM: 141 mmol/L (ref 135–146)
TOTAL PROTEIN: 7.2 g/dL (ref 6.1–8.1)

## 2017-07-22 LAB — IRON: IRON: 89 ug/dL (ref 50–180)

## 2017-07-22 LAB — FERRITIN: FERRITIN: 19 ng/mL — AB (ref 20–380)

## 2017-07-22 NOTE — Assessment & Plan Note (Signed)
HTN: Seems well controlled, continue Azor.  Checking labs Seizure disorder: Well-controlled on Keppra Anemia: History of anemia, last hemoglobin normal, ferritin slightly low suspect d/t blood donation; , he donates blood multiple times a year which is commendable however recommend to limit donation to twice a year and take iron supplements. RTC 1 year

## 2017-08-08 ENCOUNTER — Other Ambulatory Visit: Payer: Self-pay | Admitting: Urology

## 2017-08-08 DIAGNOSIS — C61 Malignant neoplasm of prostate: Secondary | ICD-10-CM

## 2017-08-15 ENCOUNTER — Encounter (HOSPITAL_COMMUNITY)
Admission: RE | Admit: 2017-08-15 | Discharge: 2017-08-15 | Disposition: A | Discharge status: Home / Self Care | Payer: Medicare Other | Source: Ambulatory Visit | Attending: Urology | Admitting: Urology

## 2017-08-15 DIAGNOSIS — C61 Malignant neoplasm of prostate: Secondary | ICD-10-CM | POA: Diagnosis present

## 2017-08-15 MED ORDER — TECHNETIUM TC 99M MEDRONATE IV KIT
19.7000 | PACK | Freq: Once | INTRAVENOUS | Status: AC | PRN
  Administered 2017-08-15: 19.7 via INTRAVENOUS

## 2017-08-29 ENCOUNTER — Other Ambulatory Visit: Payer: Self-pay

## 2017-08-29 ENCOUNTER — Other Ambulatory Visit: Payer: Self-pay | Admitting: Internal Medicine

## 2017-08-31 ENCOUNTER — Other Ambulatory Visit: Payer: Self-pay | Admitting: Internal Medicine

## 2017-09-04 ENCOUNTER — Other Ambulatory Visit: Payer: Self-pay | Admitting: Urology

## 2017-09-04 DIAGNOSIS — D376 Neoplasm of uncertain behavior of liver, gallbladder and bile ducts: Secondary | ICD-10-CM

## 2017-09-05 ENCOUNTER — Encounter: Payer: Self-pay | Admitting: Radiation Oncology

## 2017-09-11 ENCOUNTER — Ambulatory Visit (HOSPITAL_COMMUNITY)
Admission: RE | Admit: 2017-09-11 | Discharge: 2017-09-11 | Disposition: A | Discharge status: Home / Self Care | Payer: Medicare Other | Source: Ambulatory Visit | Attending: Urology | Admitting: Urology

## 2017-09-11 ENCOUNTER — Other Ambulatory Visit: Payer: Self-pay | Admitting: Urology

## 2017-09-11 DIAGNOSIS — K7689 Other specified diseases of liver: Secondary | ICD-10-CM | POA: Insufficient documentation

## 2017-09-11 DIAGNOSIS — D376 Neoplasm of uncertain behavior of liver, gallbladder and bile ducts: Secondary | ICD-10-CM | POA: Diagnosis not present

## 2017-09-11 DIAGNOSIS — N281 Cyst of kidney, acquired: Secondary | ICD-10-CM | POA: Diagnosis not present

## 2017-09-11 MED ORDER — GADOBENATE DIMEGLUMINE 529 MG/ML IV SOLN
20.0000 mL | Freq: Once | INTRAVENOUS | Status: AC | PRN
  Administered 2017-09-11: 18 mL via INTRAVENOUS

## 2017-09-26 ENCOUNTER — Encounter: Payer: Self-pay | Admitting: Radiation Oncology

## 2017-09-26 NOTE — Progress Notes (Addendum)
GU Location of Tumor / Histology: prostatic adenocarcinoma  If Prostate Cancer, Gleason Score is (4 + 4) and PSA is (4.63). Prostate volume: 4.63.   Joel [name] was informed by his PCP one year ago his PSA had risen from 0 to 4. A his next annual visit he was told by his PCP his PSA remained at a 4 thus he was referred to [facility] in April 2019. Reports he was a former patient of [facility] after being hospitalized for two days with hematuria.   Biopsies of prostate (if applicable) revealed:    Past/Anticipated interventions by urology, if any: prostate biopsy, CT and bone scan negative, MRI confirmed liver lesion was a simple cyst, referral to radiation oncology to discussion androgen deprivation plus XRT vs. Surgery.  Patient reports today during our encounter he is leaning more toward radiation therapy. Patient explains he is very active spending six hours per day in the gym and taking 4 spin classes per week. Patient fearful surgery will render him incontinent impairing his ability to be as active.   Past/Anticipated interventions by medical oncology, if any: no  Weight changes, if any: no  Bowel/Bladder complaints, if any: IPSS 1 with nocturia. SHIM 14. Denies dysuria, hematuria, urinary leakage or incontinence.   Nausea/Vomiting, if any: no  Pain issues, if any:  no  SAFETY ISSUES:  Prior radiation? no  Pacemaker/ICD? no  Possible current pregnancy? no  Is the patient on methotrexate? no  Current Complaints / other details:  72 year old male. Single. Retired [employer] professor. Brother with hx of prostate ca. Sister with hx of leukemia. NKDA. Former wife died of a brain tumor. Has two children, a son and a daughter. Resides in [place].

## 2017-09-28 ENCOUNTER — Ambulatory Visit
Admission: RE | Admit: 2017-09-28 | Discharge: 2017-09-28 | Disposition: A | Discharge status: Home / Self Care | Payer: Medicare Other | Source: Ambulatory Visit | Attending: Radiation Oncology | Admitting: Radiation Oncology

## 2017-09-28 ENCOUNTER — Other Ambulatory Visit: Payer: Self-pay

## 2017-09-28 ENCOUNTER — Encounter: Payer: Self-pay | Admitting: Radiation Oncology

## 2017-09-28 ENCOUNTER — Encounter: Payer: Self-pay | Admitting: Medical Oncology

## 2017-09-28 VITALS — BP 140/78 | HR 66 | Temp 98.8°F | Resp 18 | Ht 73.0 in | Wt 202.0 lb

## 2017-09-28 VITALS — BP 140/78 | HR 66 | Temp 98.8°F | Resp 16 | Ht 73.0 in | Wt 202.0 lb

## 2017-09-28 DIAGNOSIS — G40909 Epilepsy, unspecified, not intractable, without status epilepticus: Secondary | ICD-10-CM | POA: Insufficient documentation

## 2017-09-28 DIAGNOSIS — C61 Malignant neoplasm of prostate: Secondary | ICD-10-CM

## 2017-09-28 DIAGNOSIS — I10 Essential (primary) hypertension: Secondary | ICD-10-CM | POA: Diagnosis not present

## 2017-09-28 DIAGNOSIS — Z79899 Other long term (current) drug therapy: Secondary | ICD-10-CM | POA: Insufficient documentation

## 2017-09-28 NOTE — Progress Notes (Signed)
Radiation Oncology         [phone] ________________________________  Initial outpatient Consultation  Name: Joel [name] MRN: [id number]  Date: 09/28/2017  DOB: 23-Aug-1945  CC:Paz, Alda Berthold, MD  Alexis Frock, MD   REFERRING PHYSICIAN: Alexis Frock, MD  DIAGNOSIS: 72 y.o. 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    ICD-10-CM   1. Malignant neoplasm of prostate ([place]) C61   2. Prostate cancer [facility]) C61     HISTORY OF PRESENT ILLNESS: Joel [name] is a 72 y.o. male with a diagnosis of prostate cancer. He has a history of elevated PSA since 2018 with a PSA of 4.63 at time of CPE with his primary care physician, Dr. Larose Kells, Alda Berthold, MD.  A repeat PSA a few months later remained elevated at 4.02.   Accordingly, he was referred for evaluation in urology by Dr. Alexis Frock, MD in 06/2017,  digital rectal examination was performed at that time revealing no nodules and a repeat PSA was 3.97. Therefore, the patient proceeded to transrectal ultrasound with 12 biopsies of the prostate on 08/01/2017.  The prostate volume measured 46 grams.  Out of 12 core biopsies,12 were positive.  The maximum Gleason score was 4+4=8, and this was seen in all 12 samples.  The patient had disease staging studies with a bone scan and CT abdomen and pelvis with and without contrast on 08/15/2017 which showed no definitive findings to suggest metastatic disease in the abdomen or pelvis show renal stones, a renal cyst and liver lesions of questionable significance.  On the bone scan, there  was a tiny focus of uptake at the ninth rib bilaterally and felt most likely to be degenerative change or posttraumatic but otherwise negative for osseous metastatic disease.  He had an MRI of the liver on 09/11/2017 for further evaluation of the liver lesions  which appear cystic and benign.  He reports staying active and working out twice a day at the fitness  center. He is most interested in treatment options that will have the least impact on his ability to remain physically active in the gym.   The patient reviewed the biopsy results with his urologist and he has kindly been referred today for discussion of potential radiation treatment options. His brother has a hx of prostate cancer- treated with brachytherapy and doing well.  Sister with hx of leukemia.   PREVIOUS RADIATION THERAPY: No  PAST MEDICAL HISTORY:  Past Medical History:  Diagnosis Date  . Anemia    Dr. Paulita Fujita  . Back pain    HNP dx ~2008, no surgery  . Elevated PSA   . Fall 05-2011    had a IC bleed   . Hearing loss    Rt ear sensorineural hearing loss, with hearing aide. W/u by Dr. Remer Macho extensively negative  . HTN (hypertension)   . Prostate cancer ([place]) 2019  . Seizure disorder ([place])    Dx remotely, last SZ activity aprox 1998  . 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 CATARACT EXTRACTION Bilateral    R 2014, L 03-2013  . CRANIOTOMY  05/27/2011   Procedure: CRANIOTOMY HEMATOMA EVACUATION SUBDURAL;  Surgeon: Hosie Spangle, MD;  Location: [facility];  Service: Neurosurgery;  Laterality: Right;  Evacuation of Subdural Hematoma  . EYE SURGERY  11-2011    for retinal detachment ,  L, Dr Zadie Rhine  . HERNIA REPAIR     as an infant  . PROSTATE BIOPSY    . WISDOM TOOTH EXTRACTION     x2, Upper    FAMILY HISTORY:  Family History  Problem Relation Age of Onset  . Emphysema Mother        smoker  . Hypertension Father        F and B   . Leukemia Sister   . Prostate cancer Brother 54  . Diabetes Neg Hx   . Colon cancer Neg Hx   . Coronary artery disease Neg Hx     SOCIAL HISTORY:  Social History   Socioeconomic History  . Marital status: Single    Spouse name: Not on file  . Number of children: 2  . Years of education: PHD  . Highest education level: Not on file  Occupational  History  . Occupation: Retired: Prof [employer]  Social Needs  . Financial resource strain: Not on file  . Food insecurity:    Worry: Not on file    Inability: Not on file  . Transportation needs:    Medical: Not on file    Non-medical: Not on file  Tobacco Use  . Smoking status: Never Smoker  . Smokeless tobacco: Never Used  Substance and Sexual Activity  . Alcohol use: No    Alcohol/week: 0.0 oz    Comment: not in  years  . Drug use: No  . Sexual activity: Not Currently  Lifestyle  . Physical activity:    Days per week: Not on file    Minutes per session: Not on file  . Stress: Not on file  Relationships  . Social connections:    Talks on phone: Not on file    Gets together: Not on file    Attends religious service: Not on file    Active member of club or organization: Not on file    Attends meetings of clubs or organizations: Not on file    Relationship status: Not on file  . Intimate partner violence:    Fear of current or ex partner: Not on file    Emotionally abused: Not on file    Physically abused: Not on file    Forced sexual activity: Not on file  Other Topics Concern  . Not on file  Social History Narrative   2 children, one in [place] w/ pt   Patient is retired.   Education PHD    Right handed.   Caffeine Three cups of coffee daily.    ALLERGIES: Patient has no known allergies.  MEDICATIONS:  Current Outpatient Medications  Medication Sig Dispense Refill  . amLODipine-olmesartan (AZOR) 10-40 MG tablet Take 1 tablet by mouth daily. 90 tablet 3  . BROMFENAC SODIUM, ONCE-DAILY, OP Apply 1 drop to eye daily. Left eye only    . levETIRAcetam (KEPPRA XR) 500 MG 24 hr tablet Take 3 tablets (1,500 mg total) by mouth at bedtime. 270 tablet 3   No current facility-administered medications for this encounter.     REVIEW OF SYSTEMS:  On review of systems, the patient reports that he is doing well overall. He denies any chest pain, shortness of  breath, cough, fevers, chills, night sweats, unintended weight changes.  He reports a strong stream and ability to empty his bladder well on voiding.  He specifically denies dysuria, gross hematuria, excessive daytime frequency, urgency, or nocturia.  He denies any bowel disturbances,  and denies abdominal pain, nausea, vomiting, constipation or diarrhea. He denies any new musculoskeletal or joint aches or pains. His IPSS was 1, indicating mild urinary symptoms. He is able to complete sexual activity with most attempts. A complete review of systems is obtained and is otherwise negative.    PHYSICAL EXAM:  Wt Readings from Last 3 Encounters:  09/28/17 202 lb (91.6 kg)  09/28/17 202 lb (91.6 kg)  07/21/17 205 lb 6 oz (93.2 kg)   Temp Readings from Last 3 Encounters:  09/28/17 98.8 F (37.1 C) (Oral)  09/28/17 98.8 F (37.1 C)  07/08/15 98 F (36.7 C) (Oral)   BP Readings from Last 3 Encounters:  09/28/17 140/78  09/28/17 140/78  07/21/17 135/76   Pulse Readings from Last 3 Encounters:  09/28/17 66  09/28/17 66  07/21/17 76   Pain Assessment Pain Score: 0-No pain/10  In general this is a well appearing African American male in no acute distress. He is alert and oriented x4 and appropriate throughout the examination. HEENT reveals that the patient is normocephalic, atraumatic. EOMs are intact. PERRLA. Skin is intact without any evidence of gross lesions. Cardiovascular exam reveals a regular rate and rhythm, no clicks rubs or murmurs are auscultated. Chest is clear to auscultation bilaterally. Lymphatic assessment is performed and does not reveal any adenopathy in the cervical, supraclavicular, axillary, or inguinal chains. Abdomen has active bowel sounds in all quadrants and is intact. The abdomen is soft, non tender, non distended. Lower extremities are negative for pretibial pitting edema, deep calf tenderness, cyanosis or clubbing.   KPS = 100  100 - Normal; no complaints; no  evidence of disease. 90   - Able to carry on normal activity; minor signs or symptoms of disease. 80   - Normal activity with effort; some signs or symptoms of disease. 34   - Cares for self; unable to carry on normal activity or to do active work. 60   - Requires occasional assistance, but is able to care for most of his personal needs. 50   - Requires considerable assistance and frequent medical care. 40   - Disabled; requires special care and assistance. 9   - Severely disabled; hospital admission is indicated although death not imminent. 42   - Very sick; hospital admission necessary; active supportive treatment necessary. 10   - Moribund; fatal processes progressing rapidly. 0     - Dead  Karnofsky DA, Abelmann [place], Craver LS and Dunlap JH [phone]) The use of the nitrogen mustards in the palliative treatment of carcinoma: with particular reference to bronchogenic carcinoma Cancer 1 634-56  LABORATORY DATA:  Lab Results  Component Value Date   WBC 7.6 07/21/2017   HGB 13.2 07/21/2017   HCT 39.5 07/21/2017   MCV 91.0 07/21/2017   PLT 195 07/21/2017   Lab Results  Component Value Date   NA 141 07/21/2017   K 4.8 07/21/2017   CL 107 07/21/2017   CO2 24 07/21/2017   Lab Results  Component Value Date   ALT 16 07/21/2017   AST 26 07/21/2017   ALKPHOS 94 07/11/2016   BILITOT 0.6 07/21/2017     RADIOGRAPHY: Mr Abdomen Wwo Contrast  Result Date: 09/11/2017 CLINICAL DATA:  72 year old male with indeterminate lesions in the liver. Followup study. EXAM: MRI ABDOMEN WITHOUT AND WITH CONTRAST TECHNIQUE: Multiplanar multisequence MR imaging of the abdomen was performed both before and after the administration of intravenous contrast. CONTRAST:  28mL MULTIHANCE GADOBENATE DIMEGLUMINE 529 MG/ML IV SOLN  COMPARISON:  CT the abdomen and pelvis 08/15/2017. FINDINGS: Lower chest: Unremarkable. Hepatobiliary: There several lesions in the liver which are T1 hypointense, T2 hyperintense, and do  not enhance, compatible with simple cysts. The largest of these is centered in segment 6 (axial image 24 of series 3) measuring 2.4 x 1.6 cm. No other suspicious appearing hepatic lesions. MRCP images demonstrate no intra or extrahepatic biliary ductal dilatation. Common bile duct measures 4 mm in the porta hepatis. Gallbladder is normal in appearance. Pancreas: No pancreatic mass. No pancreatic ductal dilatation noted on MRCP images. No pancreatic or peripancreatic fluid or inflammatory changes. Spleen:  Unremarkable. Adrenals/Urinary Tract: 3.7 cm simple cyst in the interpolar region of the right kidney. Left kidney and bilateral adrenal glands are normal in appearance. No hydroureteronephrosis in the visualized portions of the abdomen. Stomach/Bowel: Visualized portions are unremarkable. Vascular/Lymphatic: No aneurysm identified in the visualized abdominal vasculature. No lymphadenopathy noted in the abdomen. Other: No significant volume of ascites noted in the visualized portions of the peritoneal cavity. Musculoskeletal: No aggressive appearing osseous lesions are noted in the visualized portions of the skeleton. IMPRESSION: 1. Lesions of concern in the liver have imaging characteristics compatible with simple cysts. No suspicious hepatic lesions are noted. 2. 3.7 cm simple cyst in the interpolar region of the right kidney. Electronically Signed   By: Vinnie Langton M.D.   On: 09/11/2017 10:39   Mr 3d Recon At Scanner  Result Date: 09/11/2017 CLINICAL DATA:  72 year old male with indeterminate lesions in the liver. Followup study. EXAM: MRI ABDOMEN WITHOUT AND WITH CONTRAST TECHNIQUE: Multiplanar multisequence MR imaging of the abdomen was performed both before and after the administration of intravenous contrast. CONTRAST:  37mL MULTIHANCE GADOBENATE DIMEGLUMINE 529 MG/ML IV SOLN COMPARISON:  CT the abdomen and pelvis 08/15/2017. FINDINGS: Lower chest: Unremarkable. Hepatobiliary: There several lesions  in the liver which are T1 hypointense, T2 hyperintense, and do not enhance, compatible with simple cysts. The largest of these is centered in segment 6 (axial image 24 of series 3) measuring 2.4 x 1.6 cm. No other suspicious appearing hepatic lesions. MRCP images demonstrate no intra or extrahepatic biliary ductal dilatation. Common bile duct measures 4 mm in the porta hepatis. Gallbladder is normal in appearance. Pancreas: No pancreatic mass. No pancreatic ductal dilatation noted on MRCP images. No pancreatic or peripancreatic fluid or inflammatory changes. Spleen:  Unremarkable. Adrenals/Urinary Tract: 3.7 cm simple cyst in the interpolar region of the right kidney. Left kidney and bilateral adrenal glands are normal in appearance. No hydroureteronephrosis in the visualized portions of the abdomen. Stomach/Bowel: Visualized portions are unremarkable. Vascular/Lymphatic: No aneurysm identified in the visualized abdominal vasculature. No lymphadenopathy noted in the abdomen. Other: No significant volume of ascites noted in the visualized portions of the peritoneal cavity. Musculoskeletal: No aggressive appearing osseous lesions are noted in the visualized portions of the skeleton. IMPRESSION: 1. Lesions of concern in the liver have imaging characteristics compatible with simple cysts. No suspicious hepatic lesions are noted. 2. 3.7 cm simple cyst in the interpolar region of the right kidney. Electronically Signed   By: Vinnie Langton M.D.   On: 09/11/2017 10:39      IMPRESSION/PLAN: 1. 72 y.o. 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  We discussed the patient's workup and outlined the nature of prostate cancer in this setting. The patient's T stage, Gleason's score, and PSA put him into the high risk group. Accordingly, he is eligible for  a variety of potential treatment options including prostatectomy or LT-ADT in combination with either 8 weeks of  external radiation or 5 weeks of external radiation combined with a brachytherapy boost. We discussed the available radiation techniques, and focused on the details and logistics and delivery. We discussed and outlined the risks, benefits, short and long-term effects associated with radiotherapy and compared and contrasted these with prostatectomy. We discussed the role of SpaceOAR in reducing the rectal toxicity associated with radiotherapy. We also detailed the role of ADT in the treatment of prostate cancer and outlined the associated side effects that could be expected with this therapy.  At the conclusion of our conversation, the patient remains undecided regarding his treatment preference but appears to be leaning towards long-term androgen deprivation therapy in combination with a seed boost and 5 weeks of external beam radiotherapy.  He has a follow-up appointment with Dr. Tresa Moore on 09/29/2017 to further discuss treatment options and recommendations and plans to make a decision regarding treatment in the near future.  We will plan to follow-up with him in the next 1 to 2 weeks if we have not heard from him prior to that.  He knows that he is welcome to call at anytime with any questions or concerns.  We enjoyed meeting him today and would be happy to participate in his care should he elect to proceed with radiotherapy.    Nicholos Johns, PA-C    [name], MD  [facility] Direct Dial: [phone]  Fax: [phone] [email or web address]  Skype  [email or web address]  This document serves as a record of services personally performed by [name], MD and Nicholos Johns, PA-C. It was created on their behalf by Vanessa Ralphs, a trained medical scribe. The creation of this record is based on the scribe's personal observations and the provider's statements to them. This document has been checked and approved by the attending provider.

## 2017-09-28 NOTE — Progress Notes (Signed)
See progress note under physician encounter. 

## 2017-09-29 NOTE — Addendum Note (Signed)
Encounter addended by: Heywood Footman, RN on: 09/29/2017 11:41 AM  Actions taken: Sign clinical note

## 2017-11-29 ENCOUNTER — Telehealth: Payer: Self-pay | Admitting: Radiation Oncology

## 2017-11-29 NOTE — Progress Notes (Addendum)
Received message from [place] at [facility] requesting an update on this patient. Phoned Holly back. No answer. Left message on her voicemail (ext [phone]) that the patient was undecided at consult but leaning toward long term ADT and seed boost. Expressed in the voicemail the patient has been seen in our office since consult and has no upcoming appointment. Expressed this RN would reach out to the patient. Hung up with [place]. Phoned patient. No answer. Left message with my contact information requesting a return call. Patient phoned back promptly. Patient reports he received Lupron yesterday in Dr. Zettie Pho office. Patient states, "I am ready to start radiation and get all this over with ...yall just let me know what to do." Patient understands this RN plans to pass this information along to the providers and staff will be in touch soon with his next radiation appointment.

## 2017-11-30 ENCOUNTER — Encounter: Payer: Self-pay | Admitting: Medical Oncology

## 2017-12-07 ENCOUNTER — Other Ambulatory Visit: Payer: Self-pay | Admitting: Urology

## 2017-12-07 ENCOUNTER — Telehealth: Payer: Self-pay | Admitting: *Deleted

## 2017-12-07 NOTE — Telephone Encounter (Signed)
CALLED PATIENT TO UPDATE, LVM FOR A RETURN CALL 

## 2017-12-11 ENCOUNTER — Telehealth: Payer: Self-pay | Admitting: *Deleted

## 2017-12-11 NOTE — Telephone Encounter (Signed)
Called patient to inform of pre-seed planning CT , chest x-ray and EKG and implant date, lvm for a return call

## 2017-12-28 ENCOUNTER — Telehealth: Payer: Self-pay | Admitting: *Deleted

## 2017-12-28 NOTE — Telephone Encounter (Signed)
CALLED PATIENT TO REMIND OF PRE-SEED APPT. FOR 12-29-17, LVM FOR A RETURN CALL

## 2017-12-29 ENCOUNTER — Ambulatory Visit (HOSPITAL_COMMUNITY)
Admission: RE | Admit: 2017-12-29 | Discharge: 2017-12-29 | Disposition: A | Discharge status: Home / Self Care | Payer: Medicare Other | Source: Ambulatory Visit | Attending: Urology | Admitting: Urology

## 2017-12-29 ENCOUNTER — Encounter (HOSPITAL_COMMUNITY)
Admission: RE | Admit: 2017-12-29 | Discharge: 2017-12-29 | Disposition: A | Discharge status: Home / Self Care | Payer: Medicare Other | Source: Ambulatory Visit | Attending: Urology | Admitting: Urology

## 2017-12-29 ENCOUNTER — Ambulatory Visit
Admission: RE | Admit: 2017-12-29 | Discharge: 2017-12-29 | Disposition: A | Discharge status: Home / Self Care | Payer: Medicare Other | Source: Ambulatory Visit | Attending: Radiation Oncology | Admitting: Radiation Oncology

## 2017-12-29 DIAGNOSIS — C61 Malignant neoplasm of prostate: Secondary | ICD-10-CM

## 2017-12-29 DIAGNOSIS — R001 Bradycardia, unspecified: Secondary | ICD-10-CM | POA: Insufficient documentation

## 2017-12-29 DIAGNOSIS — Z01818 Encounter for other preprocedural examination: Secondary | ICD-10-CM | POA: Diagnosis not present

## 2017-12-29 NOTE — Progress Notes (Signed)
  Radiation Oncology         [phone] ________________________________  Name: ROLLA KEDZIERSKI MRN: [id number]  Date: 12/29/2017  DOB: 10-05-1945  SIMULATION AND TREATMENT PLANNING NOTE PUBIC ARCH STUDY  CC:Paz, Alda Berthold, MD  Alexis Frock, MD  DIAGNOSIS: 72 y.o. 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 Prostate cancer ([place]) C61     COMPLEX SIMULATION:  The patient presented today for evaluation for possible prostate seed implant. He was brought to the radiation planning suite and placed supine on the CT couch. A 3-dimensional image study set was obtained in upload to the planning computer. There, on each axial slice, I contoured the prostate gland. Then, using three-dimensional radiation planning tools I reconstructed the prostate in view of the structures from the transperineal needle pathway to assess for possible pubic arch interference. In doing so, I did not appreciate any pubic arch interference. Also, the patient's prostate volume was estimated based on the drawn structure. The volume was 46 cc.  Given the pubic arch appearance and prostate volume, patient remains a good candidate to proceed with prostate seed implant. Today, he freely provided informed written consent to proceed.    PLAN: The patient will undergo prostate seed implant boost up front followed by IMRT to the pelvic nodes and prostate..   ________________________________  Sheral Apley Tammi Klippel, M.D.

## 2018-01-04 ENCOUNTER — Other Ambulatory Visit: Payer: Self-pay | Admitting: Urology

## 2018-01-04 DIAGNOSIS — C61 Malignant neoplasm of prostate: Secondary | ICD-10-CM

## 2018-01-24 ENCOUNTER — Telehealth: Payer: Self-pay | Admitting: *Deleted

## 2018-01-24 NOTE — Telephone Encounter (Signed)
Called patient to remind of labs for 01-25-18- arrival time - 10:45 am @ [facility] Admitting, lvm for a return call

## 2018-01-25 ENCOUNTER — Encounter (HOSPITAL_COMMUNITY)
Admission: RE | Admit: 2018-01-25 | Discharge: 2018-01-25 | Disposition: A | Discharge status: Home / Self Care | Payer: Medicare Other | Source: Ambulatory Visit | Attending: Urology | Admitting: Urology

## 2018-01-25 DIAGNOSIS — Z01812 Encounter for preprocedural laboratory examination: Secondary | ICD-10-CM | POA: Diagnosis not present

## 2018-01-25 LAB — COMPREHENSIVE METABOLIC PANEL
ALT: 22 U/L (ref 0–44)
ANION GAP: 8 (ref 5–15)
AST: 30 U/L (ref 15–41)
Albumin: 4.2 g/dL (ref 3.5–5.0)
Alkaline Phosphatase: 77 U/L (ref 38–126)
BILIRUBIN TOTAL: 1.4 mg/dL — AB (ref 0.3–1.2)
BUN: 13 mg/dL (ref 8–23)
CO2: 26 mmol/L (ref 22–32)
Calcium: 9.4 mg/dL (ref 8.9–10.3)
Chloride: 105 mmol/L (ref 98–111)
Creatinine, Ser: 0.93 mg/dL (ref 0.61–1.24)
Glucose, Bld: 109 mg/dL — ABNORMAL HIGH (ref 70–99)
Potassium: 4.8 mmol/L (ref 3.5–5.1)
Sodium: 139 mmol/L (ref 135–145)
TOTAL PROTEIN: 7.6 g/dL (ref 6.5–8.1)

## 2018-01-25 LAB — CBC
HCT: 42.8 % (ref 39.0–52.0)
Hemoglobin: 13.5 g/dL (ref 13.0–17.0)
MCH: 30.1 pg (ref 26.0–34.0)
MCHC: 31.5 g/dL (ref 30.0–36.0)
MCV: 95.5 fL (ref 80.0–100.0)
PLATELETS: 191 10*3/uL (ref 150–400)
RBC: 4.48 MIL/uL (ref 4.22–5.81)
RDW: 12.1 % (ref 11.5–15.5)
WBC: 7 10*3/uL (ref 4.0–10.5)
nRBC: 0 % (ref 0.0–0.2)

## 2018-01-25 LAB — APTT: aPTT: 29 seconds (ref 24–36)

## 2018-01-25 LAB — PROTIME-INR
INR: 1.05
PROTHROMBIN TIME: 13.6 s (ref 11.4–15.2)

## 2018-01-25 NOTE — Pre-Procedure Instructions (Signed)
CMP results 01/25/2018 sent to Dr. Tresa Moore via epic.

## 2018-01-31 ENCOUNTER — Encounter (HOSPITAL_BASED_OUTPATIENT_CLINIC_OR_DEPARTMENT_OTHER): Payer: Self-pay | Admitting: *Deleted

## 2018-01-31 ENCOUNTER — Other Ambulatory Visit: Payer: Self-pay

## 2018-01-31 ENCOUNTER — Telehealth: Payer: Self-pay | Admitting: *Deleted

## 2018-01-31 NOTE — Telephone Encounter (Signed)
CALLED PATIENT TO REMIND OF IMPLANT FOR 02-01-18, LVM FOR A RETURN CALL

## 2018-01-31 NOTE — Progress Notes (Signed)
SPOKE WITH [facility] AFTER MIDNIGHT, ARRIVE 730 AM 02-01-18 [facility] MEDS TO TAKE: EYE DROP, FLEETS ENEMA AM 02-01-18 RECORDS ON CHART/EPIC: CBC, CMET, PT, PTT 01-25-18, EKG 12-29-17, CHEST XRAY 12-29-17 DRIVER SON Keymarion [place] HAS SURGERY ORDERS IN EPIC

## 2018-02-01 ENCOUNTER — Encounter (HOSPITAL_BASED_OUTPATIENT_CLINIC_OR_DEPARTMENT_OTHER): Payer: Self-pay | Admitting: *Deleted

## 2018-02-01 ENCOUNTER — Ambulatory Visit (HOSPITAL_BASED_OUTPATIENT_CLINIC_OR_DEPARTMENT_OTHER)
Admission: RE | Admit: 2018-02-01 | Discharge: 2018-02-01 | Disposition: A | Discharge status: Home / Self Care | Payer: Medicare Other | Source: Ambulatory Visit | Attending: Urology | Admitting: Urology

## 2018-02-01 ENCOUNTER — Ambulatory Visit (HOSPITAL_BASED_OUTPATIENT_CLINIC_OR_DEPARTMENT_OTHER): Payer: Medicare Other | Admitting: Anesthesiology

## 2018-02-01 ENCOUNTER — Ambulatory Visit (HOSPITAL_COMMUNITY): Payer: Medicare Other

## 2018-02-01 ENCOUNTER — Encounter (HOSPITAL_BASED_OUTPATIENT_CLINIC_OR_DEPARTMENT_OTHER)
Admission: RE | Disposition: A | Discharge status: Home / Self Care | Payer: Self-pay | Source: Ambulatory Visit | Attending: Urology

## 2018-02-01 ENCOUNTER — Other Ambulatory Visit: Payer: Self-pay

## 2018-02-01 DIAGNOSIS — C61 Malignant neoplasm of prostate: Secondary | ICD-10-CM | POA: Insufficient documentation

## 2018-02-01 DIAGNOSIS — Z87898 Personal history of other specified conditions: Secondary | ICD-10-CM | POA: Insufficient documentation

## 2018-02-01 DIAGNOSIS — N281 Cyst of kidney, acquired: Secondary | ICD-10-CM | POA: Diagnosis not present

## 2018-02-01 DIAGNOSIS — G40909 Epilepsy, unspecified, not intractable, without status epilepticus: Secondary | ICD-10-CM | POA: Insufficient documentation

## 2018-02-01 DIAGNOSIS — Z825 Family history of asthma and other chronic lower respiratory diseases: Secondary | ICD-10-CM | POA: Diagnosis not present

## 2018-02-01 DIAGNOSIS — Z01818 Encounter for other preprocedural examination: Secondary | ICD-10-CM

## 2018-02-01 DIAGNOSIS — Z8249 Family history of ischemic heart disease and other diseases of the circulatory system: Secondary | ICD-10-CM | POA: Diagnosis not present

## 2018-02-01 DIAGNOSIS — Z8042 Family history of malignant neoplasm of prostate: Secondary | ICD-10-CM | POA: Diagnosis not present

## 2018-02-01 DIAGNOSIS — I10 Essential (primary) hypertension: Secondary | ICD-10-CM | POA: Diagnosis not present

## 2018-02-01 DIAGNOSIS — H9191 Unspecified hearing loss, right ear: Secondary | ICD-10-CM | POA: Diagnosis not present

## 2018-02-01 DIAGNOSIS — Z806 Family history of leukemia: Secondary | ICD-10-CM | POA: Diagnosis not present

## 2018-02-01 DIAGNOSIS — Z87442 Personal history of urinary calculi: Secondary | ICD-10-CM | POA: Insufficient documentation

## 2018-02-01 HISTORY — PX: SPACE OAR INSTILLATION: SHX6769

## 2018-02-01 HISTORY — PX: RADIOACTIVE SEED IMPLANT: SHX5150

## 2018-02-01 HISTORY — PX: CYSTOSCOPY: SHX5120

## 2018-02-01 SURGERY — INSERTION, RADIATION SOURCE, PROSTATE
Anesthesia: General | Site: Prostate

## 2018-02-01 MED ORDER — EPHEDRINE SULFATE-NACL 50-0.9 MG/10ML-% IV SOSY
PREFILLED_SYRINGE | INTRAVENOUS | Status: DC | PRN
  Administered 2018-02-01: 10 mg via INTRAVENOUS

## 2018-02-01 MED ORDER — PROPOFOL 10 MG/ML IV BOLUS
INTRAVENOUS | Status: DC | PRN
  Administered 2018-02-01: 150 mg via INTRAVENOUS

## 2018-02-01 MED ORDER — LIDOCAINE 2% (20 MG/ML) 5 ML SYRINGE
INTRAMUSCULAR | Status: DC | PRN
  Administered 2018-02-01: 60 mg via INTRAVENOUS

## 2018-02-01 MED ORDER — STERILE WATER FOR IRRIGATION IR SOLN
Status: DC | PRN
  Administered 2018-02-01: 1000 mL

## 2018-02-01 MED ORDER — ONDANSETRON HCL 4 MG/2ML IJ SOLN
4.0000 mg | Freq: Once | INTRAMUSCULAR | Status: DC | PRN
  Filled 2018-02-01: qty 2

## 2018-02-01 MED ORDER — IOHEXOL 300 MG/ML  SOLN
INTRAMUSCULAR | Status: DC | PRN
  Administered 2018-02-01: 7 mL

## 2018-02-01 MED ORDER — CIPROFLOXACIN IN D5W 400 MG/200ML IV SOLN
INTRAVENOUS | Status: AC
  Filled 2018-02-01: qty 200

## 2018-02-01 MED ORDER — FENTANYL CITRATE (PF) 100 MCG/2ML IJ SOLN
25.0000 ug | INTRAMUSCULAR | Status: DC | PRN
  Filled 2018-02-01: qty 1

## 2018-02-01 MED ORDER — ONDANSETRON HCL 4 MG/2ML IJ SOLN
INTRAMUSCULAR | Status: DC | PRN
  Administered 2018-02-01: 4 mg via INTRAVENOUS

## 2018-02-01 MED ORDER — GLYCOPYRROLATE PF 0.2 MG/ML IJ SOSY
PREFILLED_SYRINGE | INTRAMUSCULAR | Status: AC
  Filled 2018-02-01: qty 1

## 2018-02-01 MED ORDER — SODIUM CHLORIDE (PF) 0.9 % IJ SOLN
INTRAMUSCULAR | Status: DC | PRN
  Administered 2018-02-01: 10 mL

## 2018-02-01 MED ORDER — GLYCOPYRROLATE PF 0.2 MG/ML IJ SOSY
PREFILLED_SYRINGE | INTRAMUSCULAR | Status: DC | PRN
  Administered 2018-02-01: 2 mg via INTRAVENOUS

## 2018-02-01 MED ORDER — FENTANYL CITRATE (PF) 100 MCG/2ML IJ SOLN
INTRAMUSCULAR | Status: DC | PRN
  Administered 2018-02-01 (×3): 25 ug via INTRAVENOUS
  Administered 2018-02-01: 50 ug via INTRAVENOUS

## 2018-02-01 MED ORDER — PROPOFOL 10 MG/ML IV BOLUS
INTRAVENOUS | Status: AC
  Filled 2018-02-01: qty 20

## 2018-02-01 MED ORDER — ONDANSETRON HCL 4 MG/2ML IJ SOLN
INTRAMUSCULAR | Status: AC
  Filled 2018-02-01: qty 2

## 2018-02-01 MED ORDER — TRAMADOL HCL 50 MG PO TABS: 50.0000 mg | ORAL_TABLET | Freq: Four times a day (QID) | ORAL | 0 refills | Status: DC | PRN

## 2018-02-01 MED ORDER — EPHEDRINE 5 MG/ML INJ
INTRAVENOUS | Status: AC
  Filled 2018-02-01: qty 10

## 2018-02-01 MED ORDER — FLEET ENEMA 7-19 GM/118ML RE ENEM
1.0000 | ENEMA | Freq: Once | RECTAL | Status: AC
  Administered 2018-02-01: 1 via RECTAL
  Filled 2018-02-01: qty 1

## 2018-02-01 MED ORDER — FENTANYL CITRATE (PF) 100 MCG/2ML IJ SOLN
INTRAMUSCULAR | Status: AC
  Filled 2018-02-01: qty 2

## 2018-02-01 MED ORDER — LIDOCAINE 2% (20 MG/ML) 5 ML SYRINGE
INTRAMUSCULAR | Status: AC
  Filled 2018-02-01: qty 5

## 2018-02-01 MED ORDER — CIPROFLOXACIN IN D5W 400 MG/200ML IV SOLN
400.0000 mg | INTRAVENOUS | Status: AC
  Administered 2018-02-01: 400 mg via INTRAVENOUS
  Filled 2018-02-01: qty 200

## 2018-02-01 MED ORDER — OXYCODONE HCL 5 MG/5ML PO SOLN
5.0000 mg | Freq: Once | ORAL | Status: DC | PRN
  Filled 2018-02-01: qty 5

## 2018-02-01 MED ORDER — LACTATED RINGERS IV SOLN
INTRAVENOUS | Status: DC
  Administered 2018-02-01 (×2): via INTRAVENOUS
  Filled 2018-02-01: qty 1000

## 2018-02-01 MED ORDER — DEXAMETHASONE SODIUM PHOSPHATE 10 MG/ML IJ SOLN
INTRAMUSCULAR | Status: DC | PRN
  Administered 2018-02-01: 10 mg via INTRAVENOUS

## 2018-02-01 MED ORDER — DEXAMETHASONE SODIUM PHOSPHATE 10 MG/ML IJ SOLN
INTRAMUSCULAR | Status: AC
  Filled 2018-02-01: qty 1

## 2018-02-01 MED ORDER — OXYCODONE HCL 5 MG PO TABS
5.0000 mg | ORAL_TABLET | Freq: Once | ORAL | Status: DC | PRN
  Filled 2018-02-01: qty 1

## 2018-02-01 MED ORDER — TAMSULOSIN HCL 0.4 MG PO CAPS: 0.4000 mg | ORAL_CAPSULE | Freq: Every day | ORAL | 1 refills | Status: DC | PRN

## 2018-02-01 SURGICAL SUPPLY — 44 items
BAG URINE DRAINAGE (UROLOGICAL SUPPLIES) ×5 IMPLANT
BLADE CLIPPER SURG (BLADE) ×5 IMPLANT
CATH FOLEY 2WAY SLVR  5CC 16FR (CATHETERS) ×2
CATH FOLEY 2WAY SLVR 5CC 16FR (CATHETERS) ×3 IMPLANT
CATH ROBINSON RED A/P 16FR (CATHETERS) IMPLANT
CATH ROBINSON RED A/P 20FR (CATHETERS) ×5 IMPLANT
CLOTH BEACON ORANGE TIMEOUT ST (SAFETY) ×5 IMPLANT
CONT SPECI 4OZ STER CLIK (MISCELLANEOUS) ×10 IMPLANT
COVER BACK TABLE 60X90IN (DRAPES) ×5 IMPLANT
COVER MAYO STAND STRL (DRAPES) ×5 IMPLANT
COVER WAND RF STERILE (DRAPES) ×3 IMPLANT
DRSG TEGADERM 4X4.75 (GAUZE/BANDAGES/DRESSINGS) ×8 IMPLANT
DRSG TEGADERM 8X12 (GAUZE/BANDAGES/DRESSINGS) ×10 IMPLANT
GAUZE SPONGE 4X4 12PLY STRL (GAUZE/BANDAGES/DRESSINGS) ×2 IMPLANT
GLOVE BIO SURGEON STRL SZ 6 (GLOVE) ×2 IMPLANT
GLOVE BIO SURGEON STRL SZ 6.5 (GLOVE) IMPLANT
GLOVE BIO SURGEON STRL SZ7 (GLOVE) IMPLANT
GLOVE BIO SURGEON STRL SZ7.5 (GLOVE) ×5 IMPLANT
GLOVE BIO SURGEON STRL SZ8 (GLOVE) IMPLANT
GLOVE BIO SURGEONS STRL SZ 6.5 (GLOVE)
GLOVE BIOGEL PI IND STRL 6 (GLOVE) IMPLANT
GLOVE BIOGEL PI IND STRL 6.5 (GLOVE) IMPLANT
GLOVE BIOGEL PI IND STRL 7.0 (GLOVE) IMPLANT
GLOVE BIOGEL PI IND STRL 8 (GLOVE) IMPLANT
GLOVE BIOGEL PI INDICATOR 6 (GLOVE) ×2
GLOVE BIOGEL PI INDICATOR 6.5 (GLOVE)
GLOVE BIOGEL PI INDICATOR 7.0 (GLOVE)
GLOVE BIOGEL PI INDICATOR 8 (GLOVE)
GLOVE ECLIPSE 8.0 STRL XLNG CF (GLOVE) ×5 IMPLANT
GOWN STRL REUS W/TWL LRG LVL3 (GOWN DISPOSABLE) ×5 IMPLANT
GOWN STRL REUS W/TWL XL LVL3 (GOWN DISPOSABLE) ×5 IMPLANT
HOLDER FOLEY CATH W/STRAP (MISCELLANEOUS) IMPLANT
IMPL SPACEOAR SYSTEM 10ML (Spacer) ×3 IMPLANT
IMPLANT SPACEOAR SYSTEM 10ML (Spacer) ×5 IMPLANT
ISEED AGX100 ×116 IMPLANT
IV SOD CHL 0.9% 1000ML (IV SOLUTION) ×5 IMPLANT
KIT TURNOVER CYSTO (KITS) ×5 IMPLANT
MARKER SKIN DUAL TIP RULER LAB (MISCELLANEOUS) ×5 IMPLANT
PACK CYSTO (CUSTOM PROCEDURE TRAY) ×5 IMPLANT
SURGILUBE 2OZ TUBE FLIPTOP (MISCELLANEOUS) ×5 IMPLANT
SUT BONE WAX W31G (SUTURE) IMPLANT
SYR 10ML LL (SYRINGE) ×5 IMPLANT
UNDERPAD 30X30 (UNDERPADS AND DIAPERS) ×10 IMPLANT
WATER STERILE IRR 500ML POUR (IV SOLUTION) ×5 IMPLANT

## 2018-02-01 NOTE — Anesthesia Procedure Notes (Signed)
Procedure Name: LMA Insertion Date/Time: 02/01/2018 9:48 AM Performed by: Suan Halter, CRNA Pre-anesthesia Checklist: Patient identified, Emergency Drugs available, Suction available and Patient being monitored Patient Re-evaluated:Patient Re-evaluated prior to induction Oxygen Delivery Method: Circle system utilized Preoxygenation: Pre-oxygenation with 100% oxygen Induction Type: IV induction Ventilation: Mask ventilation without difficulty LMA: LMA inserted LMA Size: 5.0 Number of attempts: 1 Airway Equipment and Method: Bite block Placement Confirmation: positive ETCO2 Tube secured with: Tape Dental Injury: Teeth and Oropharynx as per pre-operative assessment

## 2018-02-01 NOTE — Progress Notes (Signed)
  Radiation Oncology         [phone] ________________________________  Name: LANGDON CROSSON MRN: [id number]  Date: 02/01/2018  DOB: [birth year]       Prostate Seed Implant  CC:Paz, Alda Berthold, MD  No ref. provider found  DIAGNOSIS: 72 y.o. 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 Preop testing Z01.818 DG Chest 2 View    DG Chest 2 View    PROCEDURE: Insertion of radioactive I-125 seeds into the prostate gland.  RADIATION DOSE: 110 Gy, definitive therapy.  TECHNIQUE: ORLAN AVERSA was brought to the operating room with the urologist. He was placed in the dorsolithotomy position. He was catheterized and a rectal tube was inserted. The perineum was shaved, prepped and draped. The ultrasound probe was then introduced into the rectum to see the prostate gland.  TREATMENT DEVICE: A needle grid was attached to the ultrasound probe stand and anchor needles were placed.  3D PLANNING: The prostate was imaged in 3D using a sagittal sweep of the prostate probe. These images were transferred to the planning computer. There, the prostate, urethra and rectum were defined on each axial reconstructed image. Then, the software created an optimized 3D plan and a few seed positions were adjusted. The quality of the plan was reviewed using [facility] information for the target and the following two organs at risk:  Urethra and Rectum.  Then the accepted plan was printed and handed off to the radiation therapist.  Under my supervision, the custom loading of the seeds and spacers was carried out and loaded into sealed vicryl sleeves.  These pre-loaded needles were then placed into the needle holder.Marland Kitchen  PROSTATE VOLUME STUDY:  Using transrectal ultrasound the volume of the prostate was verified to be 30.2 cc.  SPECIAL TREATMENT PROCEDURE/SUPERVISION AND HANDLING: The pre-loaded needles were then delivered under sagittal guidance. A  total of 27 needles were used to deposit 58 seeds in the prostate gland. The individual seed activity was 0.383 mCi.  SpaceOAR:  Yes  COMPLEX SIMULATION: At the end of the procedure, an anterior radiograph of the pelvis was obtained to document seed positioning and count. Cystoscopy was performed to check the urethra and bladder.  MICRODOSIMETRY: At the end of the procedure, the patient was emitting 0.089 mR/hr at 1 meter. Accordingly, he was considered safe for hospital discharge.  PLAN: The patient will return to the radiation oncology clinic for post implant CT dosimetry in three weeks.   ________________________________  Sheral Apley Tammi Klippel, M.D.

## 2018-02-01 NOTE — Brief Op Note (Signed)
02/01/2018  10:59 AM  PATIENT:  Joel [name]  72 y.o. male  PRE-OPERATIVE DIAGNOSIS:  PROSTATE CANCER  POST-OPERATIVE DIAGNOSIS:  PROSTATE CANCER  PROCEDURE:  Procedure(s) with comments: RADIOACTIVE SEED IMPLANT/BRACHYTHERAPY IMPLANT (N/A) - 58 seeds implanted SPACE OAR INSTILLATION (N/A) CYSTOSCOPY (N/A) - no seeds in bladder per Dr Tresa Moore  SURGEON:  Surgeon(s) and Role:    * Alexis Frock, MD - Primary    * [name], MD - Assisting  PHYSICIAN ASSISTANT:   ASSISTANTS: none   ANESTHESIA:   general  EBL:  0 mL   BLOOD ADMINISTERED:none  DRAINS: none   LOCAL MEDICATIONS USED:  NONE  SPECIMEN:  No Specimen  DISPOSITION OF SPECIMEN:  N/A  COUNTS:  YES  TOURNIQUET:  * No tourniquets in log *  DICTATION: .Other Dictation: Dictation Number [id number]  PLAN OF CARE: Discharge to home after PACU  PATIENT DISPOSITION:  PACU - hemodynamically stable.   Delay start of Pharmacological VTE agent (>24hrs) due to surgical blood loss or risk of bleeding: yes

## 2018-02-01 NOTE — Anesthesia Postprocedure Evaluation (Signed)
Anesthesia Post Note  Patient: LEOTHA VOELTZ  Procedure(s) Performed: RADIOACTIVE SEED IMPLANT/BRACHYTHERAPY IMPLANT (N/A Prostate) SPACE OAR INSTILLATION (N/A Perineum) CYSTOSCOPY (N/A Bladder)     Patient location during evaluation: PACU Anesthesia Type: General Level of consciousness: awake and alert Pain management: pain level controlled Vital Signs Assessment: post-procedure vital signs reviewed and stable Respiratory status: spontaneous breathing, nonlabored ventilation and respiratory function stable Cardiovascular status: blood pressure returned to baseline and stable Postop Assessment: no apparent nausea or vomiting Anesthetic complications: no    Last Vitals:  Vitals:   02/01/18 1145 02/01/18 1220  BP: 140/85 (!) 143/81  Pulse: 67 63  Resp: 15 16  Temp:  36.5 C  SpO2: 95% 97%    Last Pain:  Vitals:   02/01/18 1237  TempSrc:   PainSc: 0-No pain                 Audry Pili

## 2018-02-01 NOTE — Op Note (Signed)
NAME: Joel [name], Joel [name] MEDICAL RECORD OA:[id number] ACCOUNT [id number] DATE OF BIRTH:[birth year] FACILITY: [facility] LOCATION: [facility] PHYSICIAN:Nijee Heatwole Tresa Moore, MD  OPERATIVE REPORT  DATE OF PROCEDURE:  02/01/2018  PREOPERATIVE DIAGNOSIS:  High risk prostate cancer.  PROCEDURE:  Cystoscopy with brachytherapy seed implantation and spacer placement.  ESTIMATED BLOOD LOSS:  Nil.  COMPLICATIONS:  None.  SPECIMENS:  None.  RADIATION PARAMETERS:  110 Gy over 58 seeds and 27 catheters.  FINDINGS: 1.  Unremarkable urinary bladder after seed implantation. 2.  Successful implantation of 58 brachytherapy seeds as per plan. 3.  Successful placement of 10 mL a SpaceOAR hydrogel matrix and prerectal fat.  INDICATIONS:  The patient is a very pleasant and quite vigorous 72 year old gentleman with recent history of high-risk adenocarcinoma of the prostate that is quite large volume.  It is clinically localized.  Options were discussed for management  including ablative therapies versus surgical extirpation versus palliative protocols and he wished to proceed with primary radiation multimodality therapy with hormones with curative intent.  He presents today for  brachytherapy seed boost as part of his planned radiation treatment.  Informed consent was obtained and placed in medical record.  DESCRIPTION OF PROCEDURE:  The patient was identified.  Procedure being brachytherapy seed implantation, cystoscopy and SpaceOAR placement was confirmed.  Procedure timeout was performed.  Antibiotics administered.  General anesthesia induced.  The  patient was placed into a medium lithotomy position, sterile field was created by prepping and draping the patient's penis.  Foley catheter was placed free to straight drain with iodinated contrast within the balloon.  The transrectal ultrasound probe  was inserted with a stepper device and radiation planning was performed as per separate radiation oncology note.  The  radiation plan was formulated.  The brachytherapy seeds were administered and an anterior to posterior direction, 97 seeds or 27  catheters as prescribed plan, taking exquisite care to avoid any implantation of seeds within the bladder or rectum, which did not occur.  In situ Foley catheter was then removed after spot fluoroscopic images were obtained and cystourethroscopy was  performed.  Cystourethroscopy revealed unremarkable anterior and posterior urethra.  Inspection of bladder revealed no diverticula, calcifications, papillary lesions.  There was no evidence of intraluminal radiation seed whatsoever.  Retroflexion was performed.  No  additional findings were found and the cystoscope was removed.  Finally, using transrectal ultrasound guidance, not on anterior tension, the included SpaceOAR fine needle was inserted transperineally into the mid gland midline area of the tip of the  injection needle in the perirectal fat.  A test injection of 2 mL of saline was performed and found to be suitable position.  Next, 10 mL of the gel matrix were delivered over 10 seconds as per manufacturer's guidelines and there was excellent  displacement of the anterior rectal wall posteriorly as anticipated.  Digital rectal exam was then performed, which revealed excellent palpable gel placement.  None within the rectum.  The procedure terminated.  The patient tolerated the procedure well.   No immediate complications.  The patient was taken to postanesthesia care in stable condition.  TN/NUANCE  D:02/01/2018 T:02/01/2018 JOB:[id number]

## 2018-02-01 NOTE — Discharge Instructions (Signed)
1 - You may have urinary urgency (bladder spasms) and bloody urine on / off x few days. This is normal.  2 - Call MD or go to ER for fever >102, severe pain / nausea / vomiting not relieved by medications, or acute change in medical status    Post Anesthesia Home Care Instructions  Activity: Get plenty of rest for the remainder of the day. A responsible individual must stay with you for 24 hours following the procedure.  For the next 24 hours, DO NOT: -Drive a car -Paediatric nurse -Drink alcoholic beverages -Take any medication unless instructed by your physician -Make any legal decisions or sign important papers.  Meals: Start with liquid foods such as gelatin or soup. Progress to regular foods as tolerated. Avoid greasy, spicy, heavy foods. If nausea and/or vomiting occur, drink only clear liquids until the nausea and/or vomiting subsides. Call your physician if vomiting continues.  Special Instructions/Symptoms: Your throat may feel dry or sore from the anesthesia or the breathing tube placed in your throat during surgery. If this causes discomfort, gargle with warm salt water. The discomfort should disappear within 24 hours.  If you had a scopolamine patch placed behind your ear for the management of post- operative nausea and/or vomiting:  1. The medication in the patch is effective for 72 hours, after which it should be removed.  Wrap patch in a tissue and discard in the trash. Wash hands thoroughly with soap and water. 2. You may remove the patch earlier than 72 hours if you experience unpleasant side effects which may include dry mouth, dizziness or visual disturbances. 3. Avoid touching the patch. Wash your hands with soap and water after contact with the patch.      Radioactive Seed Implant Home Care Instructions   Activity:    Rest for the remainder of the day.  Do not drive or operate equipment today.  You may resume normal  activities in a few days as instructed by  your physician, without risk of harmful radiation exposure to those around you, provided you follow the time and distance precautions on the Radiation Oncology Instruction Sheet.   Meals: Drink plenty of lipuids and eat light foods, such as gelatin or soup this evening .  You may return to normal meal plan tomorrow.  Return To Work: You may return to work as instructed by Naval [employer].  Special Instruction:   If any seeds are found, use tweezers to pick up seeds and place in a glass container of any kind and bring to your physician's office.  Call your physician if any of these symptoms occur:   Persistent or heavy bleeding  Urine stream diminishes or stops completely after catheter is removed  Fever equal to or greater than 101 degrees F  Cloudy urine with a strong foul odor  Severe pain  You may feel some burning pain and/or hesitancy when you urinate after the catheter is removed.  These symptoms may increase over the next few weeks, but should diminish within forur to six weeks.  Applying moist heat to the lower abdomen or a hot tub bath may help relieve the pain.  If the discomfort becomes severe, please call your physician for additional medications.

## 2018-02-01 NOTE — H&P (Signed)
Joel [name] is an 72 y.o. male.    Chief Complaint: Pre-OP Prostate Brachytherapy / Space-OAR Placement  HPI:   1 - Large Volume High Risk Prostate Cancer - 12/12 cores Gleason 8 up to 80% of cores by BX 07/2017 on eval rising PSA to 4.63. Pt's brother with prostate cancer. CT and bone scan 08/2017 clinically localized. MRI slo TRUS 48mL without median lobe.   2 - Non-Complex Right Renal Cyst - 3.5cm Rt mid lateral cyst w/o enhancing nodules / mass effect / coarse calcificaitons incidental on CT 08/2017. MRI also low risk 2019.   3 - Non-Obstructing Bilateral Renal Stones - 3-20mm scattered papillary tip calcifications x 3 each kidney w/o hydro incidental on CT 08/2017.    PMH sig for subdural eval after fall, HTN. No ischemic CV disease / blood thinners. 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 His PCP is Kathlene November MD with [facility].   Today " Joel [name] " is seen to proceed with prostate brachytherapy and SPACE-OAR placement as part of multi-modal treatmetn for high risk prostate cancer. NO interval fevers. Most recent UCX negative.     Past Medical History:  Diagnosis Date  . Anemia SEVERAL YRS AGO   Dr. Paulita Fujita  . 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 Elevated PSA   . Fall 05-2011    had a IC bleed   . Hearing loss    Rt ear sensorineural hearing loss, . W/u by Dr. Remer Macho extensively negative  . HTN (hypertension)   . Prostate cancer ([place]) 2019  . Seizure disorder ([place])    Dx remotely, last SZ activity aprox 1998  . 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 CATARACT EXTRACTION Bilateral    R 2014, L 03-2013  . CRANIOTOMY  05/27/2011   Procedure: CRANIOTOMY HEMATOMA EVACUATION SUBDURAL;  Surgeon: Hosie Spangle, MD;  Location: [facility];  Service: Neurosurgery;  Laterality: Right;  Evacuation of Subdural Hematoma  . EYE SURGERY  11-2011    for retinal detachment , L, Dr  Zadie Rhine  . HERNIA REPAIR     as an infant  . PROSTATE BIOPSY  2019  . WISDOM TOOTH EXTRACTION     x2, Upper    Family History  Problem Relation Age of Onset  . Emphysema Mother        smoker  . Hypertension Father        F and B   . Leukemia Sister   . Prostate cancer Brother 46  . Diabetes Neg Hx   . Colon cancer Neg Hx   . Coronary artery disease Neg Hx    Social History:  reports that he has never smoked. He has never used smokeless tobacco. He reports that he does not drink alcohol or use drugs.  Allergies: No Known Allergies  No medications prior to admission.    No results found for this or any previous visit (from the past 48 hour(s)). No results found.  Review of Systems  Constitutional: Negative.  Negative for chills and fever.  HENT: Negative.   Eyes: Negative.   Respiratory: Negative.   Cardiovascular: Negative.   Gastrointestinal: Negative.   Genitourinary: Negative.   Musculoskeletal: Negative.   Skin: Negative.   Neurological: Negative.   Endo/Heme/Allergies: Negative.   Psychiatric/Behavioral: Negative.     Height 6\' 1"  (1.854 m), weight 91.2 kg. Physical Exam  Constitutional: He appears well-developed.  Eyes: Pupils are equal, round, and reactive to light.  Neck: Normal range of motion.  Cardiovascular: Normal rate.  Respiratory: Effort normal.  GI: Soft.  Genitourinary:  Genitourinary Comments: NO CVAT.   Musculoskeletal: Normal range of motion.  Neurological: He is alert.  Skin: Skin is warm.  Psychiatric: He has a normal mood and affect.     Assessment/Plan  Proceed as planned with prostate  brachytehrapy and SPACE-OAR today. Risks, benefits, expected peri-op course discussed previously and reiterated today.   Alexis Frock, MD 02/01/2018, 6:43 AM

## 2018-02-01 NOTE — Anesthesia Preprocedure Evaluation (Addendum)
Anesthesia Evaluation  Patient identified by MRN, date of birth, ID band Patient awake    Reviewed: Allergy & Precautions, NPO status , Patient's Chart, lab work & pertinent test results  History of Anesthesia Complications Negative for: history of anesthetic complications  Airway Mallampati: II  TM Distance: >3 FB Neck ROM: Full    Dental  (+) Dental Advisory Given, Teeth Intact   Pulmonary neg pulmonary ROS,    breath sounds clear to auscultation       Cardiovascular hypertension, Pt. on medications  Rhythm:Regular Rate:Normal     Neuro/Psych Seizures -, Well Controlled,   Subdural hematoma Hearing loss  negative psych ROS   GI/Hepatic negative GI ROS, Neg liver ROS,   Endo/Other  negative endocrine ROS  Renal/GU negative Renal ROS    Prostate cancer     Musculoskeletal negative musculoskeletal ROS (+)   Abdominal   Peds  Hematology negative hematology ROS (+)   Anesthesia Other Findings   Reproductive/Obstetrics                            Anesthesia Physical Anesthesia Plan  ASA: II  Anesthesia Plan: General   Post-op Pain Management:    Induction: Intravenous  PONV Risk Score and Plan: 3 and Treatment may vary due to age or medical condition, Ondansetron and Dexamethasone  Airway Management Planned: LMA  Additional Equipment: None  Intra-op Plan:   Post-operative Plan: Extubation in OR  Informed Consent: I have reviewed the patients History and Physical, chart, labs and discussed the procedure including the risks, benefits and alternatives for the proposed anesthesia with the patient or authorized representative who has indicated his/her understanding and acceptance.   Dental advisory given  Plan Discussed with: CRNA and Anesthesiologist  Anesthesia Plan Comments:        Anesthesia Quick Evaluation

## 2018-02-01 NOTE — Transfer of Care (Signed)
   Last Vitals:  Vitals Value Taken Time  BP 156/83 02/01/2018 11:10 AM  Temp    Pulse 74 02/01/2018 11:12 AM  Resp    SpO2 99 % 02/01/2018 11:12 AM  Vitals shown include unvalidated device data.  Last Pain:  Vitals:   02/01/18 0800  TempSrc:   PainSc: 0-No pain      Patients Stated Pain Goal: 7 (02/01/18 0800)

## 2018-02-02 ENCOUNTER — Encounter (HOSPITAL_BASED_OUTPATIENT_CLINIC_OR_DEPARTMENT_OTHER): Payer: Self-pay | Admitting: Urology

## 2018-02-05 ENCOUNTER — Encounter (HOSPITAL_BASED_OUTPATIENT_CLINIC_OR_DEPARTMENT_OTHER): Payer: Self-pay | Admitting: Urology

## 2018-02-16 ENCOUNTER — Telehealth: Payer: Self-pay | Admitting: *Deleted

## 2018-02-16 ENCOUNTER — Ambulatory Visit (HOSPITAL_COMMUNITY): Admission: RE | Admit: 2018-02-16 | Payer: Medicare Other | Source: Ambulatory Visit

## 2018-02-16 NOTE — Telephone Encounter (Signed)
Called patient to inform of MRI being moved to 02-22-18, lvm for a return call

## 2018-02-21 ENCOUNTER — Telehealth: Payer: Self-pay | Admitting: *Deleted

## 2018-02-21 NOTE — Telephone Encounter (Signed)
Called patient to remind of post seed appts. for 02-22-18, lvm for a return call

## 2018-02-22 ENCOUNTER — Ambulatory Visit
Admission: RE | Admit: 2018-02-22 | Discharge: 2018-02-22 | Disposition: A | Discharge status: Home / Self Care | Payer: Medicare Other | Source: Ambulatory Visit | Attending: Radiation Oncology | Admitting: Radiation Oncology

## 2018-02-22 ENCOUNTER — Ambulatory Visit: Payer: Medicare Other | Admitting: Radiation Oncology

## 2018-02-22 ENCOUNTER — Ambulatory Visit (HOSPITAL_COMMUNITY)
Admission: RE | Admit: 2018-02-22 | Discharge: 2018-02-22 | Disposition: A | Discharge status: Home / Self Care | Payer: Medicare Other | Source: Ambulatory Visit | Attending: Urology | Admitting: Urology

## 2018-02-22 ENCOUNTER — Encounter: Payer: Self-pay | Admitting: Medical Oncology

## 2018-02-22 DIAGNOSIS — C61 Malignant neoplasm of prostate: Secondary | ICD-10-CM | POA: Diagnosis not present

## 2018-02-22 NOTE — Progress Notes (Signed)
Joel [name] states he is doing well post brachytherapy with no major side effects. He is here to [place] and will begin radiation 03/05/18. Will continue to follow and asked him to call with questions or concerns.

## 2018-02-24 NOTE — Progress Notes (Signed)
  Radiation Oncology         [phone] ________________________________  Name: Joel [name] MRN: [id number]  Date: 02/22/2018  DOB: January 02, 1946  SIMULATION AND TREATMENT PLANNING NOTE    ICD-10-CM   1. Prostate cancer [facility]) C61     DIAGNOSIS:  72 y.o. gentleman with Stage T1c adenocarcinoma of the prostate with Gleason Score of 4+4=8, and PSA of 4.63   NARRATIVE:  The patient was brought to the [facility].  Identity was confirmed.  All relevant records and images related to the planned course of therapy were reviewed.  The patient freely provided informed written consent to proceed with treatment after reviewing the details related to the planned course of therapy. The consent form was witnessed and verified by the simulation staff.  Then, the patient was set-up in a stable reproducible supine position for radiation therapy.  A vacuum lock pillow device was custom fabricated to position his legs in a reproducible immobilized position.  Then, I performed a urethrogram under sterile conditions to identify the prostatic apex.  CT images were obtained.  Surface markings were placed.  The CT images were loaded into the planning software.  Then the prostate target and avoidance structures including the rectum, bladder, bowel and hips were contoured.  Treatment planning then occurred.  The radiation prescription was entered and confirmed.  A total of one complex treatment devices were fabricated. I have requested : Intensity Modulated Radiotherapy (IMRT) is medically necessary for this case for the following reason:  Rectal sparing.Marland Kitchen  PLAN:  The patient will receive 45 Gy in 25 fractions of 1.8 Gy, to supplement an up-front prostate seed implant boost of 110 Gy to achieve a total nominal dose of 165 Gy.  ________________________________  Sheral Apley Tammi Klippel, M.D.

## 2018-03-02 DIAGNOSIS — C61 Malignant neoplasm of prostate: Secondary | ICD-10-CM | POA: Diagnosis not present

## 2018-03-05 ENCOUNTER — Ambulatory Visit
Admission: RE | Admit: 2018-03-05 | Discharge: 2018-03-05 | Disposition: A | Discharge status: Home / Self Care | Payer: Medicare Other | Source: Ambulatory Visit | Attending: Radiation Oncology | Admitting: Radiation Oncology

## 2018-03-05 DIAGNOSIS — C61 Malignant neoplasm of prostate: Secondary | ICD-10-CM | POA: Diagnosis not present

## 2018-03-06 ENCOUNTER — Ambulatory Visit
Admission: RE | Admit: 2018-03-06 | Discharge: 2018-03-06 | Disposition: A | Discharge status: Home / Self Care | Payer: Medicare Other | Source: Ambulatory Visit | Attending: Radiation Oncology | Admitting: Radiation Oncology

## 2018-03-06 DIAGNOSIS — C61 Malignant neoplasm of prostate: Secondary | ICD-10-CM | POA: Diagnosis not present

## 2018-03-08 ENCOUNTER — Ambulatory Visit
Admission: RE | Admit: 2018-03-08 | Discharge: 2018-03-08 | Disposition: A | Discharge status: Home / Self Care | Payer: Medicare Other | Source: Ambulatory Visit | Attending: Radiation Oncology | Admitting: Radiation Oncology

## 2018-03-08 DIAGNOSIS — C61 Malignant neoplasm of prostate: Secondary | ICD-10-CM | POA: Diagnosis not present

## 2018-03-09 ENCOUNTER — Ambulatory Visit
Admission: RE | Admit: 2018-03-09 | Discharge: 2018-03-09 | Disposition: A | Discharge status: Home / Self Care | Payer: Medicare Other | Source: Ambulatory Visit | Attending: Radiation Oncology | Admitting: Radiation Oncology

## 2018-03-09 DIAGNOSIS — C61 Malignant neoplasm of prostate: Secondary | ICD-10-CM | POA: Diagnosis not present

## 2018-03-12 ENCOUNTER — Encounter: Payer: Self-pay | Admitting: Radiation Oncology

## 2018-03-12 ENCOUNTER — Encounter: Payer: Self-pay | Admitting: Medical Oncology

## 2018-03-12 ENCOUNTER — Ambulatory Visit
Admission: RE | Admit: 2018-03-12 | Discharge: 2018-03-12 | Disposition: A | Discharge status: Home / Self Care | Payer: Medicare Other | Source: Ambulatory Visit | Attending: Radiation Oncology | Admitting: Radiation Oncology

## 2018-03-12 DIAGNOSIS — C61 Malignant neoplasm of prostate: Secondary | ICD-10-CM | POA: Diagnosis not present

## 2018-03-13 ENCOUNTER — Ambulatory Visit
Admission: RE | Admit: 2018-03-13 | Discharge: 2018-03-13 | Disposition: A | Discharge status: Home / Self Care | Payer: Medicare Other | Source: Ambulatory Visit | Attending: Radiation Oncology | Admitting: Radiation Oncology

## 2018-03-13 DIAGNOSIS — C61 Malignant neoplasm of prostate: Secondary | ICD-10-CM | POA: Diagnosis not present

## 2018-03-14 DIAGNOSIS — C73 Malignant neoplasm of thyroid gland: Secondary | ICD-10-CM

## 2018-03-14 HISTORY — DX: Malignant neoplasm of thyroid gland: C73

## 2018-03-15 ENCOUNTER — Ambulatory Visit
Admission: RE | Admit: 2018-03-15 | Discharge: 2018-03-15 | Disposition: A | Discharge status: Home / Self Care | Payer: Medicare Other | Source: Ambulatory Visit | Attending: Radiation Oncology | Admitting: Radiation Oncology

## 2018-03-15 DIAGNOSIS — C61 Malignant neoplasm of prostate: Secondary | ICD-10-CM | POA: Diagnosis present

## 2018-03-16 ENCOUNTER — Ambulatory Visit
Admission: RE | Admit: 2018-03-16 | Discharge: 2018-03-16 | Disposition: A | Discharge status: Home / Self Care | Payer: Medicare Other | Source: Ambulatory Visit | Attending: Radiation Oncology | Admitting: Radiation Oncology

## 2018-03-16 DIAGNOSIS — C61 Malignant neoplasm of prostate: Secondary | ICD-10-CM | POA: Diagnosis not present

## 2018-03-19 ENCOUNTER — Ambulatory Visit
Admission: RE | Admit: 2018-03-19 | Discharge: 2018-03-19 | Disposition: A | Discharge status: Home / Self Care | Payer: Medicare Other | Source: Ambulatory Visit | Attending: Radiation Oncology | Admitting: Radiation Oncology

## 2018-03-19 DIAGNOSIS — C61 Malignant neoplasm of prostate: Secondary | ICD-10-CM | POA: Diagnosis not present

## 2018-03-20 ENCOUNTER — Ambulatory Visit
Admission: RE | Admit: 2018-03-20 | Discharge: 2018-03-20 | Disposition: A | Discharge status: Home / Self Care | Payer: Medicare Other | Source: Ambulatory Visit | Attending: Radiation Oncology | Admitting: Radiation Oncology

## 2018-03-20 DIAGNOSIS — C61 Malignant neoplasm of prostate: Secondary | ICD-10-CM | POA: Diagnosis not present

## 2018-03-21 ENCOUNTER — Ambulatory Visit
Admission: RE | Admit: 2018-03-21 | Discharge: 2018-03-21 | Disposition: A | Discharge status: Home / Self Care | Payer: Medicare Other | Source: Ambulatory Visit | Attending: Radiation Oncology | Admitting: Radiation Oncology

## 2018-03-21 DIAGNOSIS — C61 Malignant neoplasm of prostate: Secondary | ICD-10-CM | POA: Diagnosis not present

## 2018-03-22 ENCOUNTER — Ambulatory Visit
Admission: RE | Admit: 2018-03-22 | Discharge: 2018-03-22 | Disposition: A | Discharge status: Home / Self Care | Payer: Medicare Other | Source: Ambulatory Visit | Attending: Radiation Oncology | Admitting: Radiation Oncology

## 2018-03-22 DIAGNOSIS — C61 Malignant neoplasm of prostate: Secondary | ICD-10-CM | POA: Diagnosis not present

## 2018-03-23 ENCOUNTER — Ambulatory Visit
Admission: RE | Admit: 2018-03-23 | Discharge: 2018-03-23 | Disposition: A | Discharge status: Home / Self Care | Payer: Medicare Other | Source: Ambulatory Visit | Attending: Radiation Oncology | Admitting: Radiation Oncology

## 2018-03-23 DIAGNOSIS — C61 Malignant neoplasm of prostate: Secondary | ICD-10-CM | POA: Diagnosis not present

## 2018-03-26 ENCOUNTER — Ambulatory Visit
Admission: RE | Admit: 2018-03-26 | Discharge: 2018-03-26 | Disposition: A | Discharge status: Home / Self Care | Payer: Medicare Other | Source: Ambulatory Visit | Attending: Radiation Oncology | Admitting: Radiation Oncology

## 2018-03-26 DIAGNOSIS — C61 Malignant neoplasm of prostate: Secondary | ICD-10-CM | POA: Diagnosis not present

## 2018-03-27 ENCOUNTER — Ambulatory Visit
Admission: RE | Admit: 2018-03-27 | Discharge: 2018-03-27 | Disposition: A | Discharge status: Home / Self Care | Payer: Medicare Other | Source: Ambulatory Visit | Attending: Radiation Oncology | Admitting: Radiation Oncology

## 2018-03-27 DIAGNOSIS — C61 Malignant neoplasm of prostate: Secondary | ICD-10-CM | POA: Diagnosis not present

## 2018-03-28 ENCOUNTER — Ambulatory Visit
Admission: RE | Admit: 2018-03-28 | Discharge: 2018-03-28 | Disposition: A | Discharge status: Home / Self Care | Payer: Medicare Other | Source: Ambulatory Visit | Attending: Radiation Oncology | Admitting: Radiation Oncology

## 2018-03-28 DIAGNOSIS — C61 Malignant neoplasm of prostate: Secondary | ICD-10-CM | POA: Diagnosis not present

## 2018-03-29 ENCOUNTER — Ambulatory Visit
Admission: RE | Admit: 2018-03-29 | Discharge: 2018-03-29 | Disposition: A | Discharge status: Home / Self Care | Payer: Medicare Other | Source: Ambulatory Visit | Attending: Radiation Oncology | Admitting: Radiation Oncology

## 2018-03-29 DIAGNOSIS — C61 Malignant neoplasm of prostate: Secondary | ICD-10-CM | POA: Diagnosis not present

## 2018-03-30 ENCOUNTER — Ambulatory Visit
Admission: RE | Admit: 2018-03-30 | Discharge: 2018-03-30 | Disposition: A | Discharge status: Home / Self Care | Payer: Medicare Other | Source: Ambulatory Visit | Attending: Radiation Oncology | Admitting: Radiation Oncology

## 2018-03-30 DIAGNOSIS — C61 Malignant neoplasm of prostate: Secondary | ICD-10-CM | POA: Diagnosis not present

## 2018-04-01 NOTE — Progress Notes (Signed)
  Radiation Oncology         [phone] ________________________________  Name: Joel [name] MRN: [id number]  Date: 03/12/2018  DOB: 1945-12-20  3D Planning Note   Prostate Brachytherapy Post-Implant Dosimetry  Diagnosis: 73 y.o. gentleman with Stage T1c adenocarcinoma of the prostate with Gleason Score of 4+4=8, and PSA of 4.63  Narrative: On a previous date, Joel [name] returned following prostate seed implantation for post implant planning. He underwent CT scan complex simulation to delineate the three-dimensional structures of the pelvis and demonstrate the radiation distribution.  Since that time, the seed localization, and complex isodose planning with dose volume histograms have now been completed.  Results:   Prostate Coverage - The dose of radiation delivered to the 90% or more of the prostate gland (D90) was 102.83% of the prescription dose. This exceeds our goal of greater than 90%. Rectal Sparing - The volume of rectal tissue receiving the prescription dose or higher was 0.0 cc. This falls under our thresholds tolerance of 1.0 cc.  Impression: The prostate seed implant appears to show adequate target coverage and appropriate rectal sparing.  Plan:  The patient will continue to follow with urology for ongoing PSA determinations. I would anticipate a high likelihood for local tumor control with minimal risk for rectal morbidity.  ________________________________  Sheral Apley Tammi Klippel, M.D.

## 2018-04-02 ENCOUNTER — Ambulatory Visit
Admission: RE | Admit: 2018-04-02 | Discharge: 2018-04-02 | Disposition: A | Discharge status: Home / Self Care | Payer: Medicare Other | Source: Ambulatory Visit | Attending: Radiation Oncology | Admitting: Radiation Oncology

## 2018-04-02 DIAGNOSIS — C61 Malignant neoplasm of prostate: Secondary | ICD-10-CM | POA: Diagnosis not present

## 2018-04-03 ENCOUNTER — Ambulatory Visit
Admission: RE | Admit: 2018-04-03 | Discharge: 2018-04-03 | Disposition: A | Discharge status: Home / Self Care | Payer: Medicare Other | Source: Ambulatory Visit | Attending: Radiation Oncology | Admitting: Radiation Oncology

## 2018-04-03 DIAGNOSIS — C61 Malignant neoplasm of prostate: Secondary | ICD-10-CM | POA: Diagnosis not present

## 2018-04-04 ENCOUNTER — Ambulatory Visit
Admission: RE | Admit: 2018-04-04 | Discharge: 2018-04-04 | Disposition: A | Discharge status: Home / Self Care | Payer: Medicare Other | Source: Ambulatory Visit | Attending: Radiation Oncology | Admitting: Radiation Oncology

## 2018-04-04 DIAGNOSIS — C61 Malignant neoplasm of prostate: Secondary | ICD-10-CM | POA: Diagnosis not present

## 2018-04-05 ENCOUNTER — Ambulatory Visit
Admission: RE | Admit: 2018-04-05 | Discharge: 2018-04-05 | Disposition: A | Discharge status: Home / Self Care | Payer: Medicare Other | Source: Ambulatory Visit | Attending: Radiation Oncology | Admitting: Radiation Oncology

## 2018-04-05 DIAGNOSIS — C61 Malignant neoplasm of prostate: Secondary | ICD-10-CM | POA: Diagnosis not present

## 2018-04-06 ENCOUNTER — Ambulatory Visit
Admission: RE | Admit: 2018-04-06 | Discharge: 2018-04-06 | Disposition: A | Discharge status: Home / Self Care | Payer: Medicare Other | Source: Ambulatory Visit | Attending: Radiation Oncology | Admitting: Radiation Oncology

## 2018-04-06 DIAGNOSIS — C61 Malignant neoplasm of prostate: Secondary | ICD-10-CM | POA: Diagnosis not present

## 2018-04-09 ENCOUNTER — Ambulatory Visit
Admission: RE | Admit: 2018-04-09 | Discharge: 2018-04-09 | Disposition: A | Discharge status: Home / Self Care | Payer: Medicare Other | Source: Ambulatory Visit | Attending: Radiation Oncology | Admitting: Radiation Oncology

## 2018-04-09 DIAGNOSIS — C61 Malignant neoplasm of prostate: Secondary | ICD-10-CM | POA: Diagnosis not present

## 2018-04-10 ENCOUNTER — Encounter: Payer: Self-pay | Admitting: Medical Oncology

## 2018-04-10 ENCOUNTER — Ambulatory Visit
Admission: RE | Admit: 2018-04-10 | Discharge: 2018-04-10 | Disposition: A | Discharge status: Home / Self Care | Payer: Medicare Other | Source: Ambulatory Visit | Attending: Radiation Oncology | Admitting: Radiation Oncology

## 2018-04-10 DIAGNOSIS — C61 Malignant neoplasm of prostate: Secondary | ICD-10-CM | POA: Diagnosis not present

## 2018-04-19 NOTE — Progress Notes (Signed)
Congratulated Joel [name] on completing radiation. He states he has done well with minimal side effects. He has follow up with [name] 2/27 at 2:30 pm. I asked him to call with question or concerns. He voiced understanding.

## 2018-04-27 ENCOUNTER — Encounter: Payer: Self-pay | Admitting: Radiation Oncology

## 2018-04-27 NOTE — Progress Notes (Signed)
  Radiation Oncology         [phone] ________________________________  Name: Joel [name] MRN: [id number]  Date: 04/27/2018  DOB: 1945/05/28  End of Treatment Note  Diagnosis:   73 y.o. gentleman with Stage T1c adenocarcinoma of the prostate with Gleason Score of 4+4 and PSA of 4.63     Indication for treatment:  Curative, Definitive Radiotherapy       Radiation treatment dates:   02/01/2018, 03/05/2018 - 04/10/2018  Site/dose:   The prostate was treated to 110 Gy with upfront brachytherapy seed boost on 02/01/18, followed by 45 Gy in 25 fractions of 1.8 Gy with external beam.  Beams/energy (external beam):  The patient was treated with IMRT using volumetric arc therapy delivering 6 MV X-rays to clockwise and counterclockwise circumferential arcs with a 90 degree collimator offset to avoid dose scalloping.  Image guidance was performed with daily cone beam CT prior to each fraction to align to gold markers in the prostate and assure proper bladder and rectal fill volumes.  Immobilization was achieved with Body[employer] custom mold.  Narrative: The patient tolerated radiation treatment relatively well. He states he remained active throughout treatment. Towards the beginning of treatment, he reported increased flatus. He reported nocturia x5 and complete bladder emptying, and denied fatigue and hematuria throughout treatment. He reported mild dysuria and urgency in the last week of treatment and experienced some soft, loose stools with a solitary episode of diarrhea that did not need intervention.  Plan: The patient has completed radiation treatment. He will return to radiation oncology clinic for routine followup in one month. I advised him to call or return sooner if he has any questions or concerns related to his recovery or treatment. ________________________________  Sheral Apley. Tammi Klippel, M.D.  This document serves as a record of services personally performed by [name], MD. It  was created on his behalf by Wilburn Mylar, a trained medical scribe. The creation of this record is based on the scribe's personal observations and the provider's statements to them. This document has been checked and approved by the attending provider.

## 2018-05-04 LAB — PSA: PSA: <0.015

## 2018-05-10 ENCOUNTER — Ambulatory Visit
Admission: RE | Admit: 2018-05-10 | Discharge: 2018-05-10 | Disposition: A | Discharge status: Home / Self Care | Payer: Medicare Other | Source: Ambulatory Visit | Attending: Radiation Oncology | Admitting: Radiation Oncology

## 2018-05-10 ENCOUNTER — Encounter: Payer: Self-pay | Admitting: Urology

## 2018-05-10 ENCOUNTER — Other Ambulatory Visit: Payer: Self-pay

## 2018-05-10 VITALS — BP 132/82 | HR 69 | Temp 98.3°F | Resp 20 | Ht 73.0 in | Wt 204.2 lb

## 2018-05-10 DIAGNOSIS — C61 Malignant neoplasm of prostate: Secondary | ICD-10-CM | POA: Insufficient documentation

## 2018-05-10 NOTE — Progress Notes (Signed)
Radiation Oncology         [phone] ________________________________  Name: Joel [name] MRN: [id number]  Date: 05/10/2018  DOB: Apr 03, 1945  Post Treatment Note  CC: Colon Branch, MD  Alexis Frock, MD  Diagnosis:   73 y.o. gentleman with Stage T1c adenocarcinoma of the prostate with Gleason Score of 4+4 and PSA of 4.63     Interval Since Last Radiation:  4 weeks  02/01/2018, 03/05/2018 - 04/10/2018:   The prostate was treated to 110 Gy with upfront brachytherapy seed boost on 02/01/18, followed by 45 Gy in 25 fractions of 1.8 Gy with external beam.  Narrative:  The patient returns today for routine follow-up.  He tolerated radiation treatment relatively well with nocturia x5/night throughout treatment. He states he remained active throughout treatment despite mild fatigue associated with ADT and radiation. Towards the beginning of treatment, he reported increased flatus but this improved spontaneously. He denied fatigue and hematuria throughout treatment. He reported mild dysuria and urgency in the last week of treatment and experienced some soft, loose stools with a solitary episode of diarrhea that did not require intervention.                              On review of systems, the patient states that he is doing very well overall.  He reports that his LUTS continue to gradually improve and his current IPSS score is an 8 indicating mild to moderate urinary symptoms only.  He specifically denies dysuria, gross hematuria, incomplete bladder emptying, straining to void, weak stream or incontinence.  He reports a healthy appetite and is maintaining his weight.  He denies any significant fatigue or hot flashes associated with his ADT.  He has continued to work out daily and feels great in general.  He denies abdominal pain, nausea, vomiting, diarrhea or constipation.  Overall, he is most pleased with his progress to date.  ALLERGIES:  has No Known Allergies.  Meds: Current Outpatient  Medications  Medication Sig Dispense Refill  . amLODipine-olmesartan (AZOR) 10-40 MG tablet Take 1 tablet by mouth daily. 90 tablet 3  . BROMFENAC SODIUM, ONCE-DAILY, OP Apply 1 drop to eye daily. Left eye only    . levETIRAcetam (KEPPRA XR) 500 MG 24 hr tablet Take 3 tablets (1,500 mg total) by mouth at bedtime. 270 tablet 3  . tamsulosin (FLOMAX) 0.4 MG CAPS capsule Take 1 capsule (0.4 mg total) by mouth daily as needed. For urinary urgency after prostate radiation (Patient not taking: Reported on 05/10/2018) 30 capsule 1  . traMADol (ULTRAM) 50 MG tablet Take 1-2 tablets (50-100 mg total) by mouth every 6 (six) hours as needed for moderate pain or severe pain. Post-operatively. (Patient not taking: Reported on 05/10/2018) 20 tablet 0   No current facility-administered medications for this encounter.     Physical Findings:  height is 6\' 1"  (1.854 m) and weight is 204 lb 3.2 oz (92.6 kg). His oral temperature is 98.3 F (36.8 C). His blood pressure is 132/82 and his pulse is 69. His respiration is 20 and oxygen saturation is 100%.  Pain Assessment Pain Score: 0-No pain/10 In general this is a well appearing African-American male in no acute distress.  He's alert and oriented x4 and appropriate throughout the examination. Cardiopulmonary assessment is negative for acute distress and he exhibits normal effort.   Lab Findings: Lab Results  Component Value Date   WBC 7.0 01/25/2018  HGB 13.5 01/25/2018   HCT 42.8 01/25/2018   MCV 95.5 01/25/2018   PLT 191 01/25/2018     Radiographic Findings: No results found.  Impression/Plan: 1. 73 y.o. gentleman with Stage T1c adenocarcinoma of the prostate with Gleason Score of 4+4 and PSA of 4.63.    He will continue to follow up with urology for ongoing PSA determinations and had a recent follow up visit with Dr. Tresa Moore on 05/08/18.  His PSA was checked just prior to that visit and by patient report, was undetectable. He anticipates completing a  full 2-year course of androgen deprivation therapy under the care and direction of Dr. Tresa Moore and will be due for his next 40-month injection in March 2020.  He understands what to expect with regards to PSA monitoring going forward. I will look forward to following his response to treatment via correspondence with urology, and would be happy to continue to participate in his care if clinically indicated. I talked to the patient about what to expect in the future, including his risk for erectile dysfunction and rectal bleeding. I encouraged him to call or return to the office if he has any questions regarding his previous radiation or possible radiation side effects. He was comfortable with this plan and will follow up as needed.    Nicholos Johns, PA-C

## 2018-06-04 ENCOUNTER — Telehealth: Payer: Self-pay | Admitting: Nurse Practitioner

## 2018-06-05 ENCOUNTER — Other Ambulatory Visit: Payer: Self-pay | Admitting: Nurse Practitioner

## 2018-06-05 MED ORDER — LEVETIRACETAM ER 500 MG PO TB24: 1500.0000 mg | ORAL_TABLET | Freq: Every day | ORAL | 0 refills | Status: DC

## 2018-06-05 NOTE — Telephone Encounter (Signed)
Pharmacy called in and stated patient last refill was 03/10/2018 .

## 2018-07-10 ENCOUNTER — Telehealth: Payer: Self-pay

## 2018-07-10 NOTE — Telephone Encounter (Signed)
Spoke with the patient and he gave verbal consent to do a webex visit and to file his insurance. E-mail has been confirmed and sent

## 2018-07-16 ENCOUNTER — Other Ambulatory Visit: Payer: Self-pay

## 2018-07-16 ENCOUNTER — Ambulatory Visit: Payer: Medicare Other | Admitting: Nurse Practitioner

## 2018-07-16 ENCOUNTER — Encounter: Payer: Self-pay | Admitting: Neurology

## 2018-07-16 ENCOUNTER — Ambulatory Visit (INDEPENDENT_AMBULATORY_CARE_PROVIDER_SITE_OTHER): Payer: Medicare Other | Admitting: Neurology

## 2018-07-16 DIAGNOSIS — G40909 Epilepsy, unspecified, not intractable, without status epilepticus: Secondary | ICD-10-CM

## 2018-07-16 MED ORDER — LEVETIRACETAM ER 500 MG PO TB24: 1500.0000 mg | ORAL_TABLET | Freq: Every day | ORAL | 3 refills | Status: DC

## 2018-07-16 NOTE — Progress Notes (Signed)
Virtual Visit via Video Note  I connected with Joel [name] on 07/16/18 at  3:45 PM EDT by a video enabled telemedicine application and verified that I am speaking with the correct person using two identifiers.  Location: Patient: At his place of residence  Provider: At her place of residence    I discussed the limitations of evaluation and management by telemedicine and the availability of in person appointments. The patient expressed understanding and agreed to proceed.  History of Present Illness: Joel [name] isa 73 years old right handed [place] American male with a history of seizures beginning since March of 1993. Previously patients of Dr. of Dr. Erling Cruz, last clinical visit was February 2014. He would get up in the middle of the night to go the bathroom and awakened the next morning in bed with his tongue biten. Evaluation in 1993 with sleep deprived EEG and CAT scan of the brain without and with contrast were normal. 2-D echocardiogram and Holter monitoring evaluation and treadmill were normal. He had a similar episode of blacking out in the middle of the night in May of 1993. In May of 1994 he was admitted to [facility]. At that time he had an episode during the daytime and had loss of consciousness with jerking of his arms and legs and bit his tongue without urinary incontinence. EEG was normal and he was placed on Dilantin since 1994. He developed transient elevated liver function tests with GGT 232. EEG 08/22/95 was normal. MRI study of the brain 05/21/96 showed a small left arachnoid cyst in the anterior temporal fossa, and questionable atrohy in the left hippocampus.  He had a seizure at his wife's funeral in March of 1998. He denies macropsia, micropsia, dj vu, strange odors or tastes.   Phenytoin level 06/23/09 was 9.0. DEXA scan 06/30/2009 revealed normal T. Scores. He goes to a fitness center 7 days per week doing cardio, strength training, and  "spins". He can balance on the balls of his feet for a long time.  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 He is independent in activities of daily living. He denies macropsia,micropsia, strange odors or tastes. He had sudden right ear hearing loss in 07/2010 and underwent MRI study 07/2010 and 08/31/2010 which was unremarkable except for small arachnoid cyst in the anterior aspect of the left middle fossa .He underwent a course of prednisone without benefit. In mid December 2012 he got up in the middle of the night ,was on his toilet to void, stood up and next remembers awaking on the floor. He went back to bed and the next morning noted blood on his pillow from a posterior head injury. CT scan showed a small left frontal bleed, thought to represent a contrecoup injury. He was admitted to [facility] 02/25/11 and seen in consultation by Dr. Sherley Bounds, neurosurgeon He was followed as an outpatient. After repeat CT scan showed improvement he was allowed to return to the fitness center.   Mid February 2013 he had problems walking without headache. He fell striking his head 05/12/2011 and developed worsening gait with a festinating quality. 05/19/2011, he fell in the driveway. 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 He underwent craniotomy 3/15 by Dr. Jovita Gamma. He was discharged 06/02/2011.  After discharge he noted intermittent tingling in his left hand,face, and arm, lasting 30 seconds, 4 times  per day.He had slurred speech with the episodes. CT scan 06/06/11 showed bilateral subdurals right greater than left of 10 mm with 5 mm right to left midline shift. There was no evidence of new hemorrhage.   06/09/2011, he had a witnessed generalized major motor seizure and was seen in the [place] with a phenytoin level of approximate 8.Keppra 750 mg twice a day was added on since then. In combination with his  Dilantin 100 mg 2 tablets twice a day. Repeat CT scan 5/13 showed SDHs had decreased and a density in the right frontal region of unknown etiology .CT scan 09/12/2011 showed resolution of tiny left SDH, interval decrease in the small, improving white matter edema in the right frontal lobe, and a small infarct in the left frontal lobe was slightly more prominent.He is exercising daily. No seizures. He underwent scleral buckling OS for retinal detachment by Dr. Zadie Rhine and has left eye cataract to be followed by Dr. Katy Fitch. Bone density 01/12/2012 was normal  UPDATE April 30th 2015:YYHe had a right eye cataract surgery in 2014, recovering very well,he has no recurrent seizures,he is now taking Keppra xr 750mg  bid, and Dilantin 100 mg 2 tablets twice a day. He wants to consider tapering off Dilantin. He still exercise daily, 2-3 hours daily.  UPDATE May 2nd 2016:YYHe continue to exercise, has taper of Dilantin now, taking Keppra xr 500 mg 2 tablets twice a day, no significant side effect  UPDATE 05/02/2017[name] returns for yearly follow-up for his seizure disorder. He has previously been followed by Dr.Yan and Dr. Erling Cruz. Records reviewed Now taking Keppra XR 750 mg twice daily without side effects. Last seizure activity March 2013. He continues to exercise twice a day. No new neurologic complaints he returns for reevaluation UPDATE 04/26/2018CM [name], 73 year old male returns for follow-up with history of seizure disorder. He also has a history of subdural hematoma in 2013 with evacuation. His last seizure event was in March 2013. He eats healthy He continues to exercise twice a day. He is currently on Keppra extended release 500 mg 3 tablets at bedtime. He denies any side effects to the medication. He has had no interval medical problems since last seen. He returns for reevaluation UPDATE 4/29/2019[name], 73 year old male returns for follow-up with history of seizure disorder.   He had a subdural hematoma in 2013 evacuation.  His last seizure occurred in March 2013.  He continues to exercise twice a day and eat healthy.  He is sleeping well at night.  He is currently on Keppra extended release without side effects.  He is due to have a prostate biopsy for elevated PSA soon.  No other interval medical issues.  He returns for reevaluation UPDATE 07/16/2018 SS: Joel [name] 73 year old male with history of seizure disorder. His last seizure was in March 2013. He has not had any recurrent seizures. He continues taking Keppra XR 500 mg, 3 tablets at bedtime. Tolerating the medication well. He has finished radiation for prostate cancer. He is retired, enjoys being active, working out, he has written 3 books. He drives a car without difficulty, his son lives with him. He needs a refill on his Keppra. He is getting a hormone injection, Lupron for the next year.    Observations/Objective: Alert, answers questions appropriately, speech is clear and concise  Assessment and Plan: 1. Seizure disorder   He is doing very well. He will continue taking Keppra XR 500 mg, 3 tablets at bedtime. He will call for  any recurrent seizure activity. He will follow-up in 1 year.   Follow Up Instructions: 1 year for revisit    I discussed the assessment and treatment plan with the patient. The patient was provided an opportunity to ask questions and all were answered. The patient agreed with the plan and demonstrated an understanding of the instructions.   The patient was advised to call back or seek an in-person evaluation if the symptoms worsen or if the condition fails to improve as anticipated.  I provided 15 minutes of non-face-to-face time during this encounter.   Evangeline Dakin, DNP  [facility] [address] [place], [place] [address] [phone]

## 2018-07-17 NOTE — Progress Notes (Signed)
I have reviewed and agreed above plan. 

## 2018-08-20 ENCOUNTER — Other Ambulatory Visit: Payer: Self-pay | Admitting: Internal Medicine

## 2018-09-18 ENCOUNTER — Other Ambulatory Visit: Payer: Self-pay | Admitting: Internal Medicine

## 2018-09-21 ENCOUNTER — Other Ambulatory Visit: Payer: Self-pay | Admitting: Internal Medicine

## 2018-09-21 MED ORDER — AMLODIPINE-OLMESARTAN 10-40 MG PO TABS: 1.0000 | ORAL_TABLET | Freq: Every day | ORAL | 0 refills | Status: DC

## 2018-09-21 NOTE — Telephone Encounter (Signed)
Pt needing refill on Azor- appt scheduled for Tuesday 7/14. 30 day supply sent.

## 2018-09-25 ENCOUNTER — Other Ambulatory Visit: Payer: Self-pay

## 2018-09-25 ENCOUNTER — Encounter: Payer: Self-pay | Admitting: Internal Medicine

## 2018-09-25 ENCOUNTER — Ambulatory Visit (HOSPITAL_BASED_OUTPATIENT_CLINIC_OR_DEPARTMENT_OTHER)
Admission: RE | Admit: 2018-09-25 | Discharge: 2018-09-25 | Disposition: A | Discharge status: Home / Self Care | Payer: Medicare Other | Source: Ambulatory Visit | Attending: Internal Medicine | Admitting: Internal Medicine

## 2018-09-25 ENCOUNTER — Ambulatory Visit (INDEPENDENT_AMBULATORY_CARE_PROVIDER_SITE_OTHER): Payer: Medicare Other | Admitting: Internal Medicine

## 2018-09-25 VITALS — BP 147/80 | HR 80 | Temp 98.8°F | Resp 18 | Ht 71.0 in | Wt 208.2 lb

## 2018-09-25 DIAGNOSIS — Z0000 Encounter for general adult medical examination without abnormal findings: Secondary | ICD-10-CM | POA: Diagnosis not present

## 2018-09-25 DIAGNOSIS — E010 Iodine-deficiency related diffuse (endemic) goiter: Secondary | ICD-10-CM | POA: Insufficient documentation

## 2018-09-25 MED ORDER — AMLODIPINE-OLMESARTAN 10-40 MG PO TABS: 1.0000 | ORAL_TABLET | Freq: Every day | ORAL | 3 refills | Status: DC

## 2018-09-25 NOTE — Assessment & Plan Note (Addendum)
-  Tdap 2019 - Pneumonia shot 2014 - prevnar 2016 - zostavax :2014 - shingrex discussed - flu shot recommended   --CCS: colonoscopy 2003, cscope   04-2012, tubular adenoma, again 06-2015 -- next 5 year per pt (GI  Dr Paulita Fujita) -- prostate cancer: per urology team  --Labs: CMP, FLP, CBC, TSH --Continue with healthy lifestyle

## 2018-09-25 NOTE — Progress Notes (Signed)
Pre visit review using our clinic review tool, if applicable. No additional management support is needed unless otherwise documented below in the visit note. 

## 2018-09-25 NOTE — Progress Notes (Signed)
Subjective:    Patient ID: Joel [name], male    DOB: 08-Sep-1945, 73 y.o.   MRN: [id number]  DOS:  09/25/2018 Type of visit - description: CPX Since the last office visit he was diagnosed and treated for prostate cancer. Fortunately had initially side effects from treatment but they are getting better.   BP Readings from Last 3 Encounters:  09/25/18 (!) 147/80  05/10/18 132/82  02/01/18 (!) 143/81     Review of Systems   Other than above, a 14 point review of systems is negative     Past Medical History:  Diagnosis Date  . Anemia SEVERAL YRS AGO   Dr. Paulita Fujita  . 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 Fall 05-2011    had a IC bleed   . Hearing loss    Rt ear sensorineural hearing loss, . W/u by Dr. Remer Macho extensively negative  . HTN (hypertension)   . Prostate cancer ([place]) 2019  . Prostate cancer ([place])   . Seizure disorder ([place])    Dx remotely, last SZ activity aprox 1998  . Subdural hematoma ([place]) 2013    Past Surgical History:  Procedure Laterality Date  . CATARACT EXTRACTION Bilateral    R 2014, L 03-2013  . CRANIOTOMY  05/27/2011   Procedure: CRANIOTOMY HEMATOMA EVACUATION SUBDURAL;  Surgeon: Hosie Spangle, MD;  Location: [facility];  Service: Neurosurgery;  Laterality: Right;  Evacuation of Subdural Hematoma  . CYSTOSCOPY N/A 02/01/2018   Procedure: CYSTOSCOPY;  Surgeon: Alexis Frock, MD;  Location: [facility];  Service: Urology;  Laterality: N/A;  no seeds in bladder per Dr Tresa Moore  . EYE SURGERY  11-2011    for retinal detachment , L, Dr Zadie Rhine  . HERNIA REPAIR     as an infant  . PROSTATE BIOPSY  2019  . RADIOACTIVE SEED IMPLANT N/A 02/01/2018   Procedure: RADIOACTIVE SEED IMPLANT/BRACHYTHERAPY IMPLANT;  Surgeon: Alexis Frock, MD;  Location: [facility];  Service: Urology;  Laterality: N/A;  58 seeds implanted  . SPACE OAR INSTILLATION N/A 02/01/2018   Procedure: SPACE OAR INSTILLATION;   Surgeon: Alexis Frock, MD;  Location: [facility];  Service: Urology;  Laterality: N/A;  . WISDOM TOOTH EXTRACTION     x2, Upper    Social History   Socioeconomic History  . Marital status: Single    Spouse name: Not on file  . Number of children: 2  . Years of education: PHD  . Highest education level: Not on file  Occupational History  . Occupation: Retired: Prof [employer]  Social Needs  . Financial resource strain: Not on file  . Food insecurity    Worry: Not on file    Inability: Not on file  . Transportation needs    Medical: Not on file    Non-medical: Not on file  Tobacco Use  . Smoking status: Never Smoker  . Smokeless tobacco: Never Used  Substance and Sexual Activity  . Alcohol use: No    Alcohol/week: 0.0 standard drinks    Comment: not in  years  . Drug use: No  . Sexual activity: Not Currently  Lifestyle  . Physical activity    Days per week: Not on file    Minutes per session: Not on file  . Stress: Not on file  Relationships  . Social Herbalist on phone: Not on file    Gets together: Not on  file    Attends religious service: Not on file    Active member of club or organization: Not on file    Attends meetings of clubs or organizations: Not on file    Relationship status: Not on file  . Intimate partner violence    Fear of current or ex partner: Not on file    Emotionally abused: Not on file    Physically abused: Not on file    Forced sexual activity: Not on file  Other Topics Concern  . Not on file  Social History Narrative   2 children, one in [place] w/ pt   Patient is retired.   Education PHD    Right handed.   Caffeine Three cups of coffee daily.     Family History  Problem Relation Age of Onset  . Emphysema Mother        smoker  . Hypertension Father        F and B   . Leukemia Sister   . Prostate cancer Brother 72  . Diabetes Neg Hx   . Colon cancer Neg Hx   . Coronary artery  disease Neg Hx      Allergies as of 09/25/2018   No Known Allergies     Medication List       Accurate as of September 25, 2018 11:59 PM. If you have any questions, ask your nurse or doctor.        STOP taking these medications   traMADol 50 MG tablet Commonly known as: Ultram Stopped by: Kathlene November, MD     TAKE these medications   amLODipine-olmesartan 10-40 MG tablet Commonly known as: AZOR Take 1 tablet by mouth daily.   BROMFENAC SODIUM (ONCE-DAILY) OP Apply 1 drop to eye daily. Left eye only   levETIRAcetam 500 MG 24 hr tablet Commonly known as: Keppra XR Take 3 tablets (1,500 mg total) by mouth at bedtime.           Objective:   Physical Exam BP (!) 147/80 (BP Location: Left Arm, Patient Position: Sitting, Cuff Size: Small)   Pulse 80   Temp 98.8 F (37.1 C) (Oral)   Resp 18   Ht 5\' 11"  (1.803 m)   Wt 208 lb 4 oz (94.5 kg)   SpO2 99%   BMI 29.04 kg/m  General: Well developed, NAD, BMI noted Neck: No  thyromegaly  HEENT:  Normocephalic . Face symmetric, atraumatic ?  Increased thyroid gland size on the right.  Not tender, not nodular Lungs:  CTA B Normal respiratory effort, no intercostal retractions, no accessory muscle use. Heart: RRR,  no murmur.  No pretibial edema bilaterally  Abdomen:  Not distended, soft, non-tender. No rebound or rigidity.   Skin: Exposed areas without rash. Not pale. Not jaundice Neurologic:  alert & oriented X3.  Speech normal, gait appropriate for age and unassisted Strength symmetric and appropriate for age.  Psych: Cognition and judgment appear intact.  Cooperative with normal attention span and concentration.  Behavior appropriate. No anxious or depressed appearing.     Assessment      Assessment  HTN Seizure disorder, last activity 1998 R hearing loss, h/o ENT eval (-) Intracranial bleed due to fall 2013 Anemia -- Dr. Paulita Fujita Prostate cancer: XRT 2019, rx  lupron H/o retina detachment  H/o back pain,  NPH, no surgery 2008 H/o   moles in the back, saw  Dermatology 2015, told ok, was not rec to  schedule a RTC  Plan: HTN: On Azor, BP today slightly elevated but reports normal readings at home.  Continue checking. Seizure disorder: Last visit with neurology 07/2018, stable, follow-up in 1 year Prostate cancer: Status post radiation therapy, Lupron injection approximately 05-2018.  Next visit with urology 11-2018.  Side effects from treatment are gradually improving. Thyromegaly?  Question of enlargement on the right side, will get an ultrasound. RTC 1 year

## 2018-09-25 NOTE — Patient Instructions (Addendum)
GO TO THE LAB : Get the blood work     GO TO THE FRONT DESK Schedule your next appointment  A physical exam in 1 year   Check the  blood pressure 2 or 3 times a month   Be sure your blood pressure is between 110/65 and  135/85. If it is consistently higher or lower, let me know

## 2018-09-26 LAB — CBC WITH DIFFERENTIAL/PLATELET
Basophils Absolute: 0 10*3/uL (ref 0.0–0.1)
Basophils Relative: 0.6 % (ref 0.0–3.0)
Eosinophils Absolute: 0.1 10*3/uL (ref 0.0–0.7)
Eosinophils Relative: 1.4 % (ref 0.0–5.0)
HCT: 36.6 % — ABNORMAL LOW (ref 39.0–52.0)
Hemoglobin: 12.2 g/dL — ABNORMAL LOW (ref 13.0–17.0)
Lymphocytes Relative: 11.6 % — ABNORMAL LOW (ref 12.0–46.0)
Lymphs Abs: 0.7 10*3/uL (ref 0.7–4.0)
MCHC: 33.4 g/dL (ref 30.0–36.0)
MCV: 94.1 fl (ref 78.0–100.0)
Monocytes Absolute: 0.7 10*3/uL (ref 0.1–1.0)
Monocytes Relative: 11.5 % (ref 3.0–12.0)
Neutro Abs: 4.7 10*3/uL (ref 1.4–7.7)
Neutrophils Relative %: 74.9 % (ref 43.0–77.0)
Platelets: 184 10*3/uL (ref 150.0–400.0)
RBC: 3.89 Mil/uL — ABNORMAL LOW (ref 4.22–5.81)
RDW: 12.7 % (ref 11.5–15.5)
WBC: 6.3 10*3/uL (ref 4.0–10.5)

## 2018-09-26 LAB — LIPID PANEL
Cholesterol: 190 mg/dL (ref 0–200)
HDL: 38.4 mg/dL — ABNORMAL LOW (ref >39.00)
LDL Cholesterol: 132 mg/dL — ABNORMAL HIGH (ref 0–99)
NonHDL: 151.76
Total CHOL/HDL Ratio: 5
Triglycerides: 101 mg/dL (ref 0.0–149.0)
VLDL: 20.2 mg/dL (ref 0.0–40.0)

## 2018-09-26 LAB — COMPREHENSIVE METABOLIC PANEL
ALT: 12 U/L (ref 0–53)
AST: 18 U/L (ref 0–37)
Albumin: 4.3 g/dL (ref 3.5–5.2)
Alkaline Phosphatase: 101 U/L (ref 39–117)
BUN: 13 mg/dL (ref 6–23)
CO2: 23 mEq/L (ref 19–32)
Calcium: 9.4 mg/dL (ref 8.4–10.5)
Chloride: 106 mEq/L (ref 96–112)
Creatinine, Ser: 0.89 mg/dL (ref 0.40–1.50)
GFR: 101.8 mL/min (ref >60.00)
Glucose, Bld: 90 mg/dL (ref 70–99)
Potassium: 3.5 mEq/L (ref 3.5–5.1)
Sodium: 139 mEq/L (ref 135–145)
Total Bilirubin: 0.9 mg/dL (ref 0.2–1.2)
Total Protein: 7.2 g/dL (ref 6.0–8.3)

## 2018-09-26 LAB — TSH: TSH: 1.12 u[IU]/mL (ref 0.35–4.50)

## 2018-09-27 NOTE — Assessment & Plan Note (Signed)
HTN: On Azor, BP today slightly elevated but reports normal readings at home.  Continue checking. Seizure disorder: Last visit with neurology 07/2018, stable, follow-up in 1 year Prostate cancer: Status post radiation therapy, Lupron injection approximately 05-2018.  Next visit with urology 11-2018.  Side effects from treatment are gradually improving. Thyromegaly?  Question of enlargement on the right side, will get an ultrasound. RTC 1 year

## 2018-10-01 ENCOUNTER — Telehealth: Payer: Self-pay | Admitting: Internal Medicine

## 2018-10-01 DIAGNOSIS — E042 Nontoxic multinodular goiter: Secondary | ICD-10-CM

## 2018-10-01 NOTE — Telephone Encounter (Signed)
Cholesterol looks very good. Has mild anemia, up-to-date on colon cancer screening no GI symptoms, reassess in 3 months Ultrasound of the thyroid showed 2 nodules that meet criteria for biopsy. LMOM to discuss above

## 2018-10-01 NOTE — Telephone Encounter (Signed)
Order placed

## 2018-10-01 NOTE — Telephone Encounter (Signed)
All results discussed with the patient.  We agreed to proceed with a biopsy of the thyroid, please arrange.  DX thyroid nodules.

## 2018-10-02 ENCOUNTER — Other Ambulatory Visit: Payer: Self-pay | Admitting: Internal Medicine

## 2018-10-02 DIAGNOSIS — E042 Nontoxic multinodular goiter: Secondary | ICD-10-CM

## 2018-10-10 ENCOUNTER — Ambulatory Visit
Admission: RE | Admit: 2018-10-10 | Discharge: 2018-10-10 | Disposition: A | Discharge status: Home / Self Care | Payer: Medicare Other | Source: Ambulatory Visit | Attending: Internal Medicine | Admitting: Internal Medicine

## 2018-10-10 ENCOUNTER — Other Ambulatory Visit (HOSPITAL_COMMUNITY)
Admission: RE | Admit: 2018-10-10 | Discharge: 2018-10-10 | Disposition: A | Discharge status: Home / Self Care | Payer: Medicare Other | Source: Ambulatory Visit | Attending: Radiology | Admitting: Radiology

## 2018-10-10 DIAGNOSIS — C73 Malignant neoplasm of thyroid gland: Secondary | ICD-10-CM | POA: Diagnosis not present

## 2018-10-10 DIAGNOSIS — E042 Nontoxic multinodular goiter: Secondary | ICD-10-CM | POA: Diagnosis present

## 2018-10-12 ENCOUNTER — Telehealth: Payer: Self-pay | Admitting: Internal Medicine

## 2018-10-12 ENCOUNTER — Encounter: Payer: Self-pay | Admitting: Internal Medicine

## 2018-10-12 DIAGNOSIS — C73 Malignant neoplasm of thyroid gland: Secondary | ICD-10-CM | POA: Insufficient documentation

## 2018-10-12 NOTE — Telephone Encounter (Signed)
Referral placed.

## 2018-10-12 NOTE — Telephone Encounter (Signed)
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  Patient aware of the diagnosis Refer to ENT, has seen Dr. Wilburn Cornelia before. DX thyroid cancer.

## 2018-10-31 IMAGING — NM NM BONE WHOLE BODY
2 series · 2 of 2 positions shown · non-contrast
Comparison: No comparison bone scan. CT abdomen and pelvis
08/15/2017. Head CT 09/12/2011.

CLINICAL DATA: 72-year-old male. Prostate cancer. PSA 3.97. No
complaints. Prior craniotomy for evacuation subdural hematoma.
Subsequent encounter.

EXAM:
NUCLEAR MEDICINE WHOLE BODY BONE SCAN
TECHNIQUE: Whole body anterior and posterior images were obtained approximately
3 hours after intravenous injection of radiopharmaceutical.
RADIOPHARMACEUTICALS:  19.7 mCi Gechnetium-55m MDP IV

[Series 1: wbr_bone_40 whole body · 2.66mm/px · 1 of 1 slices shown (1 of 2)]
[im 1/1]
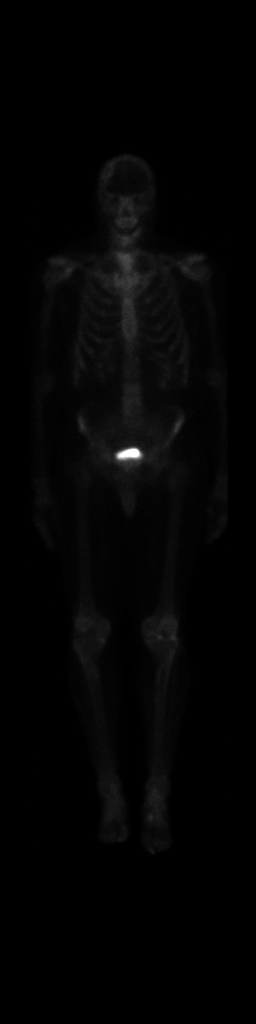

[Series 1: wbr_bone_40 whole body · 2.66mm/px · 1 of 1 slices shown (2 of 2)]
[im 1/1]
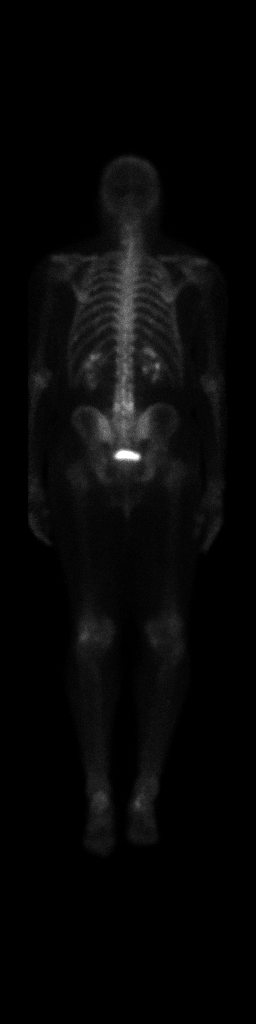

[2 of 2 positions shown; findings below may reference images not displayed]

FINDINGS: Slight increased uptake L4-5 level most likely related to facet
degenerative changes.

Increased uptake surrounding the feet and knees consistent with
degenerative changes.

Increased uptake right calvarium consistent with prior craniotomy.

Questionable tiny areas of increased radiotracer uptake ninth rib
bilaterally. Attention to this on follow-up.

Both kidneys visualized.
IMPRESSION: Questionable tiny areas of increased radiotracer uptake ninth rib
bilaterally. Attention to this on follow-up.

No other areas of radiotracer uptake suspicious for osseous
metastatic disease.

## 2018-11-22 ENCOUNTER — Encounter: Payer: Self-pay | Admitting: Internal Medicine

## 2018-11-22 ENCOUNTER — Telehealth: Payer: Self-pay | Admitting: Internal Medicine

## 2018-11-22 NOTE — Telephone Encounter (Signed)
Spoke with the patient, he already saw Dr. Wilburn Cornelia and subsequently was referred to [facility].  Surgery has been scheduled for December 14, 2018. I am glad that things are moving alone, he knows to reach out if there is any questions. He had a flu shot today.

## 2018-11-22 NOTE — Telephone Encounter (Signed)
LMOM at both home and mobile phones. I told him I liked it to be sure he has been able to see the ENT doctor, the thyroid biopsy showed cancer and is extremely important he is seen in a timely manner.  We will also send a MyChart message

## 2018-12-06 LAB — PSA: PSA: <0.015

## 2018-12-14 ENCOUNTER — Encounter: Payer: Self-pay | Admitting: Internal Medicine

## 2018-12-14 HISTORY — PX: NECK DISSECTION: SUR422

## 2018-12-14 HISTORY — PX: THYROIDECTOMY: SHX17

## 2018-12-14 HISTORY — PX: STERNOTOMY: SHX1057

## 2018-12-18 MED ORDER — BACITRACIN ZINC 500 UNIT/GM EX OINT
TOPICAL_OINTMENT | CUTANEOUS | Status: DC
Start: 2018-12-18 — End: 2018-12-18

## 2018-12-18 MED ORDER — ACETAMINOPHEN 500 MG PO TABS
1000.00 | ORAL_TABLET | ORAL | Status: DC
Start: 2018-12-18 — End: 2018-12-18

## 2018-12-18 MED ORDER — GENERIC EXTERNAL MEDICATION
12.50 | Status: DC
Start: 2018-12-18 — End: 2018-12-18

## 2018-12-18 MED ORDER — LEVOTHYROXINE SODIUM 75 MCG PO TABS
150.00 | ORAL_TABLET | ORAL | Status: DC
Start: 2018-12-19 — End: 2018-12-18

## 2018-12-18 MED ORDER — CARBOXYMETHYLCELLULOSE SODIUM 0.5 % OP SOLN
5.00 | OPHTHALMIC | Status: DC
Start: ? — End: 2018-12-18

## 2018-12-18 MED ORDER — LEVETIRACETAM ER 500 MG PO TB24
1500.00 | ORAL_TABLET | ORAL | Status: DC
Start: 2018-12-19 — End: 2018-12-18

## 2018-12-18 MED ORDER — TRAMADOL HCL 50 MG PO TABS
50.00 | ORAL_TABLET | ORAL | Status: DC
Start: ? — End: 2018-12-18

## 2018-12-18 MED ORDER — ENOXAPARIN SODIUM 40 MG/0.4ML ~~LOC~~ SOLN
40.00 | SUBCUTANEOUS | Status: DC
Start: 2018-12-19 — End: 2018-12-18

## 2018-12-18 MED ORDER — LOSARTAN POTASSIUM 50 MG PO TABS
100.00 | ORAL_TABLET | ORAL | Status: DC
Start: 2018-12-19 — End: 2018-12-18

## 2018-12-18 MED ORDER — DEXTROSE 10 % IV SOLN
125.00 | INTRAVENOUS | Status: DC
Start: ? — End: 2018-12-18

## 2018-12-18 MED ORDER — INSULIN LISPRO 100 UNIT/ML ~~LOC~~ SOLN
2.00 | SUBCUTANEOUS | Status: DC
Start: 2018-12-18 — End: 2018-12-18

## 2018-12-18 MED ORDER — AMLODIPINE BESYLATE 5 MG PO TABS
10.00 | ORAL_TABLET | ORAL | Status: DC
Start: 2018-12-19 — End: 2018-12-18

## 2018-12-18 MED ORDER — OXYCODONE HCL 5 MG PO TABS
5.00 | ORAL_TABLET | ORAL | Status: DC
Start: ? — End: 2018-12-18

## 2018-12-18 MED ORDER — MELATONIN 3 MG PO TABS
6.00 | ORAL_TABLET | ORAL | Status: DC
Start: ? — End: 2018-12-18

## 2018-12-18 MED ORDER — ONDANSETRON HCL 4 MG/2ML IJ SOLN
4.00 | INTRAMUSCULAR | Status: DC
Start: ? — End: 2018-12-18

## 2018-12-18 MED ORDER — POLYETHYLENE GLYCOL 3350 17 G PO PACK
17.00 | PACK | ORAL | Status: DC
Start: 2018-12-19 — End: 2018-12-18

## 2018-12-18 MED ORDER — GLUCOSE 40 % PO GEL
15.00 | ORAL | Status: DC
Start: ? — End: 2018-12-18

## 2018-12-18 MED ORDER — GENERIC EXTERNAL MEDICATION
500.00 | Status: DC
Start: 2018-12-18 — End: 2018-12-18

## 2019-01-21 DIAGNOSIS — I480 Paroxysmal atrial fibrillation: Secondary | ICD-10-CM | POA: Insufficient documentation

## 2019-01-24 ENCOUNTER — Ambulatory Visit: Payer: Medicare Other | Admitting: Internal Medicine

## 2019-01-24 ENCOUNTER — Other Ambulatory Visit: Payer: Self-pay

## 2019-01-24 ENCOUNTER — Encounter: Payer: Self-pay | Admitting: Internal Medicine

## 2019-01-24 VITALS — BP 122/80 | HR 82 | Temp 96.6°F | Resp 16 | Ht 71.0 in | Wt 196.1 lb

## 2019-01-24 DIAGNOSIS — I480 Paroxysmal atrial fibrillation: Secondary | ICD-10-CM | POA: Diagnosis not present

## 2019-01-24 DIAGNOSIS — Z859 Personal history of malignant neoplasm, unspecified: Secondary | ICD-10-CM | POA: Diagnosis not present

## 2019-01-24 DIAGNOSIS — C73 Malignant neoplasm of thyroid gland: Secondary | ICD-10-CM

## 2019-01-24 NOTE — Patient Instructions (Addendum)
Please schedule Medicare Wellness with Glenard Haring.   GO TO THE FRONT DESK Schedule your next appointment   A physical exam by 09/2019

## 2019-01-24 NOTE — Progress Notes (Signed)
Subjective:    Patient ID: Joel [name], male    DOB: Apr 16, 1945, 73 y.o.   MRN: [id number]  DOS:  01/24/2019 Type of visit - description: Follow-up Since the last visit, he was diagnosed with thyroid cancer, had extensive surgery, notes from surgery, endocrinology reviewed Also, had transient atrial fibrillation postop.  Cardiology note reviewed.   Review of Systems In general feeling well and feels optimistic about the future. Denies chest pain other than when he coughs. No difficulty breathing, edema in the lower extremities or palpitations  Past Medical History:  Diagnosis Date  . Anemia SEVERAL YRS AGO   Dr. Paulita Fujita  . 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 Fall 05-2011    had a IC bleed   . Hearing loss    Rt ear sensorineural hearing loss, . W/u by Dr. Remer Macho extensively negative  . HTN (hypertension)   . Prostate cancer ([place]) 2019  . Seizure disorder ([place])    Dx remotely, last SZ activity aprox 1998  . 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 Thyroid cancer ([place]) 2020    Past Surgical History:  Procedure Laterality Date  . CATARACT EXTRACTION Bilateral    R 2014, L 03-2013  . CRANIOTOMY  05/27/2011   Procedure: CRANIOTOMY HEMATOMA EVACUATION SUBDURAL;  Surgeon: Hosie Spangle, MD;  Location: [facility];  Service: Neurosurgery;  Laterality: Right;  Evacuation of Subdural Hematoma  . CYSTOSCOPY N/A 02/01/2018   Procedure: CYSTOSCOPY;  Surgeon: Alexis Frock, MD;  Location: [facility];  Service: Urology;  Laterality: N/A;  no seeds in bladder per Dr Tresa Moore  . EYE SURGERY  11-2011    for retinal detachment , L, Dr Zadie Rhine  . HERNIA REPAIR     as an infant  . NECK DISSECTION  12/14/2018  . PROSTATE BIOPSY  2019  . RADIOACTIVE SEED IMPLANT N/A 02/01/2018   Procedure: RADIOACTIVE SEED IMPLANT/BRACHYTHERAPY IMPLANT;  Surgeon: Alexis Frock, MD;  Location: [facility];  Service: Urology;  Laterality: N/A;  58 seeds  implanted  . SPACE OAR INSTILLATION N/A 02/01/2018   Procedure: SPACE OAR INSTILLATION;  Surgeon: Alexis Frock, MD;  Location: [facility];  Service: Urology;  Laterality: N/A;  . STERNOTOMY  12/14/2018  . THYROIDECTOMY  12/14/2018  . WISDOM TOOTH EXTRACTION     x2, Upper    Social History   Socioeconomic History  . Marital status: Single    Spouse name: Not on file  . Number of children: 2  . Years of education: PHD  . Highest education level: Not on file  Occupational History  . Occupation: Retired: Prof [employer]  Social Needs  . Financial resource strain: Not on file  . Food insecurity    Worry: Not on file    Inability: Not on file  . Transportation needs    Medical: Not on file    Non-medical: Not on file  Tobacco Use  . Smoking status: Never Smoker  . Smokeless tobacco: Never Used  Substance and Sexual Activity  . Alcohol use: No    Alcohol/week: 0.0 standard drinks    Comment: not in  years  . Drug use: No  . Sexual activity: Not Currently  Lifestyle  . Physical activity    Days per week: Not on file    Minutes per session: Not on file  . Stress: Not on file  Relationships  . Social Herbalist on phone:  Not on file    Gets together: Not on file    Attends religious service: Not on file    Active member of club or organization: Not on file    Attends meetings of clubs or organizations: Not on file    Relationship status: Not on file  . Intimate partner violence    Fear of current or ex partner: Not on file    Emotionally abused: Not on file    Physically abused: Not on file    Forced sexual activity: Not on file  Other Topics Concern  . Not on file  Social History Narrative   2 children, one in [place] w/ pt   Patient is retired.   Education PHD    Right handed.   Caffeine Three cups of coffee daily.      Allergies as of 01/24/2019   No Known Allergies     Medication List       Accurate as of  January 24, 2019 11:59 PM. If you have any questions, ask your nurse or doctor.        amLODipine-olmesartan 10-40 MG tablet Commonly known as: AZOR Take 1 tablet by mouth daily.   BROMFENAC SODIUM (ONCE-DAILY) OP Apply 1 drop to eye daily. Left eye only   calcium carbonate 500 MG chewable tablet Commonly known as: TUMS - dosed in mg elemental calcium Chew 1 tablet by mouth 3 (three) times daily with meals.   levETIRAcetam 500 MG 24 hr tablet Commonly known as: Keppra XR Take 3 tablets (1,500 mg total) by mouth at bedtime.   levothyroxine 150 MCG tablet Commonly known as: SYNTHROID Take 150 mcg by mouth daily before breakfast.   Lupron Depot (40-Month) 11.25 MG injection Generic drug: leuprolide Inject into the muscle. As directed   metoprolol tartrate 25 MG tablet Commonly known as: LOPRESSOR Take 12.5 mg by mouth 2 (two) times daily.           Objective:   Physical Exam BP 122/80 (BP Location: Left Arm, Patient Position: Sitting, Cuff Size: Normal)   Pulse 82   Temp (!) 96.6 F (35.9 C) (Temporal)   Resp 16   Ht 5\' 11"  (1.803 m)   Wt 196 lb 2 oz (89 kg)   SpO2 100%   BMI 27.35 kg/m  General:   Well developed, NAD, BMI noted.  HEENT:  Normocephalic . Face symmetric, atraumatic Lungs:  CTA B Normal respiratory effort, no intercostal retractions, no accessory muscle use. Chest wall: Well-healed surgical scars Heart: RRR,  no murmur.  no pretibial edema bilaterally  Abdomen:  Not distended, soft, non-tender. No rebound or rigidity.   Skin: Not pale. Not jaundice Neurologic:  alert & oriented X3.  Speech normal, gait appropriate for age and unassisted Psych--  Cognition and judgment appear intact.  Cooperative with normal attention span and concentration.  Behavior appropriate. No anxious or depressed appearing.     Assessment     Assessment  HTN Paroxysmal A. fib: DX 12-2018, postop.  No anticoagulation or ASA as of 01/24/2019 Seizure  disorder, last activity 1998 R hearing loss, h/o ENT eval (-) Intracranial bleed due to fall 2013 Anemia -- Dr. Paulita Fujita Oncology: -Prostate cancer: XRT 2019, rx  lupron -Papillary  thyroid cancer, s/p total thyroidectomy, sternotomy, mediastinal lymph dissection on 12/14/2018 H/o retina detachment  H/o back pain, NPH, no surgery 2008 H/o   moles in the back, saw  Dermatology 2015, told ok, was not  rec to schedule a RTC   PLAN  Papillary thyroid carcinoma: Status post extensive surgery 12/14/2018, subsequently saw Endo, low risk for recurrence. Paroxysmal A. fib: Noted to be on A. fib postop, saw cardiology 01/21/2019, relatively low risk for stroke, A. fib was transient, history of previous subdural hematoma, they elected to hold off anticoagulation/ASA, recommend beta-blockers, follow-up 1 year. ECHO done yesterday, I reviewed the report, he has mild to moderate pericardial  effusion, recommend to discuss results with cardiology. He seems to be in sinus rhythm today. HTN: Well-controlled, no change. RTC 09-2019 CPX

## 2019-01-24 NOTE — Progress Notes (Signed)
Pre visit review using our clinic review tool, if applicable. No additional management support is needed unless otherwise documented below in the visit note. 

## 2019-01-26 DIAGNOSIS — C73 Malignant neoplasm of thyroid gland: Secondary | ICD-10-CM | POA: Insufficient documentation

## 2019-01-26 NOTE — Assessment & Plan Note (Signed)
Papillary thyroid carcinoma: Status post extensive surgery 12/14/2018, subsequently saw Endo, low risk for recurrence. Paroxysmal A. fib: Noted to be on A. fib postop, saw cardiology 01/21/2019, relatively low risk for stroke, A. fib was transient, history of previous subdural hematoma, they elected to hold off anticoagulation/ASA, recommend beta-blockers, follow-up 1 year. ECHO done yesterday, I reviewed the report, Joel [name] has mild to moderate pericardial  effusion, recommend to discuss results with cardiology. Joel [name] seems to be in sinus rhythm today. HTN: Well-controlled, no change. RTC 09-2019 CPX

## 2019-02-13 NOTE — Progress Notes (Signed)
Virtual Visit via Video Note  I connected with patient on 02/14/19 at  3:15 PM EST by audio enabled telemedicine application and verified that I am speaking with the correct person using two identifiers.   THIS ENCOUNTER IS A VIRTUAL VISIT DUE TO COVID-19 - PATIENT WAS NOT SEEN IN THE OFFICE. PATIENT HAS CONSENTED TO VIRTUAL VISIT / TELEMEDICINE VISIT   Location of patient: home  Location of provider: office  I discussed the limitations of evaluation and management by telemedicine and the availability of in person appointments. The patient expressed understanding and agreed to proceed.   Subjective:   Joel [name] is a 73 y.o. male who presents for Medicare Annual/Subsequent preventive examination.  Pt enjoys doing sudoku on his phone. Pt has also written 4 books.   Review of Systems:   Home Safety/Smoke Alarms: Feels safe in home. Smoke alarms in place.  Lives in 1 story home. Son lives with him.   Male:   CCS- due 2022  PSA-  Lab Results  Component Value Date   PSA <0.015 12/06/2018   PSA <0.015 05/04/2018   PSA 3.97 06/12/2017       Objective:    Vitals: BP 127/78 Comment: pt reported  There is no height or weight on file to calculate BMI.  Advanced Directives 02/14/2019 05/10/2018 02/01/2018 07/21/2017 07/11/2016 05/28/2011 05/27/2011  Does Patient Have a Medical Advance Directive? No No No No No Patient does not have advance directive Patient does not have advance directive  Does patient want to make changes to medical advance directive? - - - - Yes (MAU/Ambulatory/Procedural Areas - Information given) - -  Would patient like information on creating a medical advance directive? No - Patient declined No - Patient declined No - Patient declined Yes (MAU/Ambulatory/Procedural Areas - Information given) - - -  Pre-existing out of facility DNR order (yellow form or pink MOST form) - - - - - No No    Tobacco Social History   Tobacco Use  Smoking Status Never Smoker   Smokeless Tobacco Never Used     Counseling given: Not Answered   Clinical Intake: Pain : No/denies pain   Past Medical History:  Diagnosis Date  . Anemia SEVERAL YRS AGO   Dr. Paulita Fujita  . 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 Fall 05-2011    had a IC bleed   . Hearing loss    Rt ear sensorineural hearing loss, . W/u by Dr. Remer Macho extensively negative  . HTN (hypertension)   . Prostate cancer ([place]) 2019  . Seizure disorder ([place])    Dx remotely, last SZ activity aprox 1998  . 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 Thyroid cancer ([place]) 2020   Past Surgical History:  Procedure Laterality Date  . CATARACT EXTRACTION Bilateral    R 2014, L 03-2013  . CRANIOTOMY  05/27/2011   Procedure: CRANIOTOMY HEMATOMA EVACUATION SUBDURAL;  Surgeon: Hosie Spangle, MD;  Location: [facility];  Service: Neurosurgery;  Laterality: Right;  Evacuation of Subdural Hematoma  . CYSTOSCOPY N/A 02/01/2018   Procedure: CYSTOSCOPY;  Surgeon: Alexis Frock, MD;  Location: [facility];  Service: Urology;  Laterality: N/A;  no seeds in bladder per Dr Tresa Moore  . EYE SURGERY  11-2011    for retinal detachment , L, Dr Zadie Rhine  . HERNIA REPAIR     as an infant  . NECK DISSECTION  12/14/2018  . PROSTATE BIOPSY  2019  .  RADIOACTIVE SEED IMPLANT N/A 02/01/2018   Procedure: RADIOACTIVE SEED IMPLANT/BRACHYTHERAPY IMPLANT;  Surgeon: Alexis Frock, MD;  Location: [facility];  Service: Urology;  Laterality: N/A;  58 seeds implanted  . SPACE OAR INSTILLATION N/A 02/01/2018   Procedure: SPACE OAR INSTILLATION;  Surgeon: Alexis Frock, MD;  Location[facility];  Service: Urology;  Laterality: N/A;  . STERNOTOMY  12/14/2018  . THYROIDECTOMY  12/14/2018  . WISDOM TOOTH EXTRACTION     x2, Upper   Family History  Problem Relation Age of Onset  . Emphysema Mother        smoker  . Hypertension Father        F and B   . Leukemia Sister   . Prostate  cancer Brother 34  . Diabetes Neg Hx   . Colon cancer Neg Hx   . Coronary artery disease Neg Hx    Social History   Socioeconomic History  . Marital status: Single    Spouse name: Not on file  . Number of children: 2  . Years of education: PHD  . Highest education level: Not on file  Occupational History  . Occupation: Retired: Prof [employer]  Social Needs  . Financial resource strain: Not on file  . Food insecurity    Worry: Not on file    Inability: Not on file  . Transportation needs    Medical: Not on file    Non-medical: Not on file  Tobacco Use  . Smoking status: Never Smoker  . Smokeless tobacco: Never Used  Substance and Sexual Activity  . Alcohol use: No    Alcohol/week: 0.0 standard drinks    Comment: not in  years  . Drug use: No  . Sexual activity: Not Currently  Lifestyle  . Physical activity    Days per week: Not on file    Minutes per session: Not on file  . Stress: Not on file  Relationships  . Social Herbalist on phone: Not on file    Gets together: Not on file    Attends religious service: Not on file    Active member of club or organization: Not on file    Attends meetings of clubs or organizations: Not on file    Relationship status: Not on file  Other Topics Concern  . Not on file  Social History Narrative   2 children, one in [place] w/ pt   Patient is retired.   Education PHD    Right handed.   Caffeine Three cups of coffee daily.    Outpatient Encounter Medications as of 02/14/2019  Medication Sig  . amLODipine-olmesartan (AZOR) 10-40 MG tablet Take 1 tablet by mouth daily.  Marland Kitchen BROMFENAC SODIUM, ONCE-DAILY, OP Apply 1 drop to eye daily. Left eye only  . calcium carbonate (TUMS - DOSED IN MG ELEMENTAL CALCIUM) 500 MG chewable tablet Chew 1 tablet by mouth 3 (three) times daily with meals.  Marland Kitchen leuprolide (LUPRON DEPOT, 38-MONTH,) 11.25 MG injection Inject into the muscle. As directed  . levETIRAcetam (KEPPRA XR)  500 MG 24 hr tablet Take 3 tablets (1,500 mg total) by mouth at bedtime.  Marland Kitchen levothyroxine (SYNTHROID) 150 MCG tablet Take 150 mcg by mouth daily before breakfast.  . metoprolol tartrate (LOPRESSOR) 25 MG tablet Take 12.5 mg by mouth 2 (two) times daily.   No facility-administered encounter medications on file as of 02/14/2019.     Activities of Daily Living  In your present state of health, do you have any difficulty performing the following activities: 02/14/2019  Hearing? N  Vision? N  Difficulty concentrating or making decisions? N  Walking or climbing stairs? N  Dressing or bathing? N  Doing errands, shopping? N  Preparing Food and eating ? N  Using the Toilet? N  In the past six months, have you accidently leaked urine? N  Do you have problems with loss of bowel control? N  Managing your Medications? N  Managing your Finances? N  Housekeeping or managing your Housekeeping? N  Some recent data might be hidden    Patient Care Team: Colon Branch, MD as PCP - General Sharyne Peach, MD as Consulting Physician (Ophthalmology) Zadie Rhine Clent Demark, MD as Consulting Physician (Ophthalmology) Marcial Pacas, MD as Consulting Physician (Neurology) Arta Silence, MD as Consulting Physician (Gastroenterology) Alexis Frock, MD as Consulting Physician (Urology) Jerrell Belfast, MD as Consulting Physician (Otolaryngology) Francina Ames, MD as Referring Physician (Otolaryngology) Moss Mc, MD as Referring Physician (Cardiology) Kathlene Cote, MD as Referring Physician (Endocrinology)   Assessment:   This is a routine wellness examination for [place]. Physical assessment deferred to PCP.  Exercise Activities and Dietary recommendations Current Exercise Habits: Home exercise routine, Type of exercise: walking, Time (Minutes): 60, Frequency (Times/Week): 7, Weekly Exercise (Minutes/Week): 420, Intensity: Mild, Exercise limited by: None identified Diet (meal preparation, eat  out, water intake, caffeinated beverages, dairy products, fruits and vegetables): in general, a "healthy" diet  , well balanced    Goals    . Healthy Lifestyle     Continue to eat heart healthy diet (full of fruits, vegetables, whole grains, lean protein, water--limit salt, fat, and sugar intake) and increase physical activity as tolerated. Continue doing brain stimulating activities (puzzles, reading, adult coloring books, staying active) to keep memory sharp.     . Weight (lb) < 200 lb (90.7 kg)     With diet and exercise        Fall Risk Fall Risk  02/14/2019 09/25/2018 09/28/2017 07/21/2017 07/11/2016  Falls in the past year? 1 0 No No No  Number falls in past yr: - 0 - - -  Injury with Fall? - - - - -  Follow up Education provided;Falls prevention discussed - - - -    Depression Screen PHQ 2/9 Scores 01/24/2019 09/28/2017 07/21/2017 07/11/2016  PHQ - 2 Score 0 0 0 0    Cognitive Function Ad8 score reviewed for issues:  Issues making decisions:no  Less interest in hobbies / activities:no  Repeats questions, stories (family complaining):no  Trouble using ordinary gadgets (microwave, computer, phone):no  Forgets the month or year: no  Mismanaging finances: no  Remembering appts:no  Daily problems with thinking and/or memory:no Ad8 score is=0     MMSE - Mini Mental State Exam 07/11/2016  Orientation to time 5  Orientation to Place 5  Registration 3  Attention/ Calculation 5  Recall 3  Language- name 2 objects 2  Language- repeat 1  Language- follow 3 step command 3  Language- read & follow direction 1  Write a sentence 1  Copy design 1  Total score 30        Immunization History  Administered Date(s) Administered  . Fluad Quad(high Dose 65+) 11/22/2018  . Influenza Whole 02/12/2007, 01/09/2008, 12/24/2008, 12/17/2009, 01/20/2013  . Influenza, High Dose Seasonal PF 01/12/2018  . Influenza-Unspecified 10/31/2015, 01/10/2017  . Pneumococcal Conjugate-13  05/30/2014, 10/31/2015  . Pneumococcal Polysaccharide-23 04/03/2012  . Td 05/16/2007  .  Tdap 07/23/2017  . Zoster 05/04/2012    Screening Tests Health Maintenance  Topic Date Due  . COLONOSCOPY  06/23/2020  . TETANUS/TDAP  07/24/2027  . INFLUENZA VACCINE  Completed  . Hepatitis C Screening  Completed  . PNA vac Low Risk Adult  Completed     Plan:    See you next year!  Keep up the great work!   I have personally reviewed and noted the following in the patient's chart:   . Medical and social history . Use of alcohol, tobacco or illicit drugs  . Current medications and supplements . Functional ability and status . Nutritional status . Physical activity . Advanced directives . List of other physicians . Hospitalizations, surgeries, and ER visits in previous 12 months . Vitals . Screenings to include cognitive, depression, and falls . Referrals and appointments  In addition, I have reviewed and discussed with patient certain preventive protocols, quality metrics, and best practice recommendations. A written personalized care plan for preventive services as well as general preventive health recommendations were provided to patient.     Shela Nevin, [place]  02/14/2019

## 2019-02-14 ENCOUNTER — Ambulatory Visit (INDEPENDENT_AMBULATORY_CARE_PROVIDER_SITE_OTHER): Payer: Medicare Other | Admitting: *Deleted

## 2019-02-14 ENCOUNTER — Encounter: Payer: Self-pay | Admitting: *Deleted

## 2019-02-14 ENCOUNTER — Other Ambulatory Visit: Payer: Self-pay

## 2019-02-14 VITALS — BP 127/78

## 2019-02-14 DIAGNOSIS — Z0000 Encounter for general adult medical examination without abnormal findings: Secondary | ICD-10-CM

## 2019-02-14 NOTE — Patient Instructions (Signed)
See you next year!  Keep up the great work!   Joel [name] , Thank you for taking time to come for your Medicare Wellness Visit. I appreciate your ongoing commitment to your health goals. Please review the following plan we discussed and let me know if I can assist you in the future.   These are the goals we discussed: Goals    . Healthy Lifestyle     Continue to eat heart healthy diet (full of fruits, vegetables, whole grains, lean protein, water--limit salt, fat, and sugar intake) and increase physical activity as tolerated. Continue doing brain stimulating activities (puzzles, reading, adult coloring books, staying active) to keep memory sharp.     . Weight (lb) < 200 lb (90.7 kg)     With diet and exercise        This is a list of the screening recommended for you and due dates:  Health Maintenance  Topic Date Due  . Colon Cancer Screening  06/23/2020  . Tetanus Vaccine  07/24/2027  . Flu Shot  Completed  .  Hepatitis C: One time screening is recommended by Center for Disease Control  (CDC) for  adults born from [birth year] through 1963.   Completed  . Pneumonia vaccines  Completed    Preventive Care 71 Years and Older, Male Preventive care refers to lifestyle choices and visits with your health care provider that can promote health and wellness. This includes:  A yearly physical exam. This is also called an annual well check.  Regular dental and eye exams.  Immunizations.  Screening for certain conditions.  Healthy lifestyle choices, such as diet and exercise. What can I expect for my preventive care visit? Physical exam Your health care provider will check:  Height and weight. These may be used to calculate body mass index (BMI), which is a measurement that tells if you are at a healthy weight.  Heart rate and blood pressure.  Your skin for abnormal spots. Counseling Your health care provider may ask you questions about:  Alcohol, tobacco, and drug  use.  Emotional well-being.  Home and relationship well-being.  Sexual activity.  Eating habits.  History of falls.  Memory and ability to understand (cognition).  Work and work Statistician. What immunizations do I need?  Influenza (flu) vaccine  This is recommended every year. Tetanus, diphtheria, and pertussis (Tdap) vaccine  You may need a Td booster every 10 years. Varicella (chickenpox) vaccine  You may need this vaccine if you have not already been vaccinated. Zoster (shingles) vaccine  You may need this after age 71. Pneumococcal conjugate (PCV13) vaccine  One dose is recommended after age 71. Pneumococcal polysaccharide (PPSV23) vaccine  One dose is recommended after age 65. Measles, mumps, and rubella (MMR) vaccine  You may need at least one dose of MMR if you were born in 1957 or later. You may also need a second dose. Meningococcal conjugate (MenACWY) vaccine  You may need this if you have certain conditions. Hepatitis A vaccine  You may need this if you have certain conditions or if you travel or work in places where you may be exposed to hepatitis A. Hepatitis B vaccine  You may need this if you have certain conditions or if you travel or work in places where you may be exposed to hepatitis B. Haemophilus influenzae type b (Hib) vaccine  You may need this if you have certain conditions. You may receive vaccines as individual doses or as more than one vaccine together  in one shot (combination vaccines). Talk with your health care provider about the risks and benefits of combination vaccines. What tests do I need? Blood tests  Lipid and cholesterol levels. These may be checked every 5 years, or more frequently depending on your overall health.  Hepatitis C test.  Hepatitis B test. Screening  Lung cancer screening. You may have this screening every year starting at age 1 if you have a 30-pack-year history of smoking and currently smoke or have  quit within the past 15 years.  Colorectal cancer screening. All adults should have this screening starting at age 66 and continuing until age 72. Your health care provider may recommend screening at age 5 if you are at increased risk. You will have tests every 1-10 years, depending on your results and the type of screening test.  Prostate cancer screening. Recommendations will vary depending on your family history and other risks.  Diabetes screening. This is done by checking your blood sugar (glucose) after you have not eaten for a while (fasting). You may have this done every 1-3 years.  Abdominal aortic aneurysm (AAA) screening. You may need this if you are a current or former smoker.  Sexually transmitted disease (STD) testing. Follow these instructions at home: Eating and drinking  Eat a diet that includes fresh fruits and vegetables, whole grains, lean protein, and low-fat dairy products. Limit your intake of foods with high amounts of sugar, saturated fats, and salt.  Take vitamin and mineral supplements as recommended by your health care provider.  Do not drink alcohol if your health care provider tells you not to drink.  If you drink alcohol: ? Limit how much you have to 0-2 drinks a day. ? Be aware of how much alcohol is in your drink. In the [place] one drink equals one 12 oz bottle of beer (355 mL), one 5 oz glass of wine (148 mL), or one 1 oz glass of hard liquor (44 mL). Lifestyle  Take daily care of your teeth and gums.  Stay active. Exercise for at least 30 minutes on 5 or more days each week.  Do not use any products that contain nicotine or tobacco, such as cigarettes, e-cigarettes, and chewing tobacco. If you need help quitting, ask your health care provider.  If you are sexually active, practice safe sex. Use a condom or other form of protection to prevent STIs (sexually transmitted infections).  Talk with your health care provider about taking a low-dose aspirin  or statin. What's next?  Visit your health care provider once a year for a well check visit.  Ask your health care provider how often you should have your eyes and teeth checked.  Stay up to date on all vaccines. This information is not intended to replace advice given to you by your health care provider. Make sure you discuss any questions you have with your health care provider. Document Released: 03/27/2015 Document Revised: 02/22/2018 Document Reviewed: 02/22/2018 Elsevier Patient Education  2020 [employer].

## 2019-03-21 DIAGNOSIS — E890 Postprocedural hypothyroidism: Secondary | ICD-10-CM | POA: Diagnosis not present

## 2019-03-21 DIAGNOSIS — C73 Malignant neoplasm of thyroid gland: Secondary | ICD-10-CM | POA: Diagnosis not present

## 2019-05-05 ENCOUNTER — Ambulatory Visit: Payer: Medicare PPO | Attending: Internal Medicine

## 2019-05-05 DIAGNOSIS — Z23 Encounter for immunization: Secondary | ICD-10-CM | POA: Insufficient documentation

## 2019-05-05 NOTE — Progress Notes (Signed)
   Covid-19 Vaccination Clinic  Name:  Joel [name]    MRN: [id number] DOB: 1945-12-17  05/05/2019  [name] was observed post Covid-19 immunization for 15 minutes without incidence. He was provided with Vaccine Information Sheet and instruction to access the V-Safe system.   [name] was instructed to call 911 with any severe reactions post vaccine: Marland Kitchen Difficulty breathing  . Swelling of your face and throat  . A fast heartbeat  . A bad rash all over your body  . Dizziness and weakness    Immunizations Administered    Name Date Dose VIS Date Route   Pfizer COVID-19 Vaccine 05/05/2019  2:26 PM 0.3 mL 02/22/2019 Intramuscular   Manufacturer: [place]   Lot: [id number]   [place]: [id number]

## 2019-05-29 ENCOUNTER — Ambulatory Visit: Payer: Medicare PPO | Attending: Internal Medicine

## 2019-05-29 DIAGNOSIS — Z23 Encounter for immunization: Secondary | ICD-10-CM

## 2019-05-29 NOTE — Progress Notes (Signed)
   Covid-19 Vaccination Clinic  Name:  Joel [name]    MRN: [id number] DOB: [birth year]  05/29/2019  [name] was observed post Covid-19 immunization for 15 minutes without incident. He was provided with Vaccine Information Sheet and instruction to access the V-Safe system.   [name] was instructed to call 911 with any severe reactions post vaccine: Marland Kitchen Difficulty breathing  . Swelling of face and throat  . A fast heartbeat  . A bad rash all over body  . Dizziness and weakness   Immunizations Administered    Name Date Dose VIS Date Route   Pfizer COVID-19 Vaccine 05/29/2019 10:20 AM 0.3 mL 02/22/2019 Intramuscular   Manufacturer: [place]   Lot: [id number]   [place]: [id number]

## 2019-06-03 DIAGNOSIS — C61 Malignant neoplasm of prostate: Secondary | ICD-10-CM | POA: Diagnosis not present

## 2019-06-05 ENCOUNTER — Ambulatory Visit (INDEPENDENT_AMBULATORY_CARE_PROVIDER_SITE_OTHER): Payer: Medicare PPO | Admitting: Internal Medicine

## 2019-06-05 ENCOUNTER — Other Ambulatory Visit: Payer: Self-pay

## 2019-06-05 ENCOUNTER — Encounter: Payer: Self-pay | Admitting: Internal Medicine

## 2019-06-05 VITALS — BP 130/78 | HR 69 | Temp 97.0°F | Ht 71.0 in | Wt 196.0 lb

## 2019-06-05 DIAGNOSIS — J302 Other seasonal allergic rhinitis: Secondary | ICD-10-CM

## 2019-06-05 LAB — PSA: PSA: <0.015

## 2019-06-05 MED ORDER — AZELASTINE HCL 0.1 % NA SOLN: 2.0000 | Freq: Two times a day (BID) | NASAL | 6 refills | Status: DC

## 2019-06-05 NOTE — Progress Notes (Signed)
Pre visit review using our clinic review tool, if applicable. No additional management support is needed unless otherwise documented below in the visit note. 

## 2019-06-05 NOTE — Progress Notes (Signed)
Subjective:    Patient ID: Joel [name], male    DOB: 10-08-1945, 74 y.o.   MRN: [id number]  DOS:  06/05/2019 Type of visit - description: Virtual Visit via Video Note  I connected with the above patient  by a video enabled telemedicine application and verified that I am speaking with the correct person using two identifiers.   THIS ENCOUNTER IS A VIRTUAL VISIT DUE TO COVID-19 - PATIENT WAS NOT SEEN IN THE OFFICE. PATIENT HAS CONSENTED TO VIRTUAL VISIT / TELEMEDICINE VISIT   Location of patient: home  Location of provider: office  I discussed the limitations of evaluation and management by telemedicine and the availability of in person appointments. The patient expressed understanding and agreed to proceed.  Acute A week ago noted bilateral nose congestion, it was so congested he had to mouth breathe. He could not sleep. Eventually got a over-the-counter decongestant and that has worked very well however he knows this is not a good long-term solution.     Review of Systems Denies fever chills No nausea, vomiting, myalgias No muscle aches No itchy eyes/nose or sneezing  Past Medical History:  Diagnosis Date  . Anemia SEVERAL YRS AGO   Dr. Paulita Fujita  . 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 Fall 05-2011    had a IC bleed   . Hearing loss    Rt ear sensorineural hearing loss, . W/u by Dr. Remer Macho extensively negative  . HTN (hypertension)   . Prostate cancer ([place]) 2019  . Seizure disorder ([place])    Dx remotely, last SZ activity aprox 1998  . 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 Thyroid cancer ([place]) 2020    Past Surgical History:  Procedure Laterality Date  . CATARACT EXTRACTION Bilateral    R 2014, L 03-2013  . CRANIOTOMY  05/27/2011   Procedure: CRANIOTOMY HEMATOMA EVACUATION SUBDURAL;  Surgeon: Hosie Spangle, MD;  Location: [facility];  Service: Neurosurgery;  Laterality: Right;  Evacuation of Subdural Hematoma  . CYSTOSCOPY N/A 02/01/2018    Procedure: CYSTOSCOPY;  Surgeon: Alexis Frock, MD;  Location: [facility];  Service: Urology;  Laterality: N/A;  no seeds in bladder per Dr Tresa Moore  . EYE SURGERY  11-2011    for retinal detachment , L, Dr Zadie Rhine  . HERNIA REPAIR     as an infant  . NECK DISSECTION  12/14/2018  . PROSTATE BIOPSY  2019  . RADIOACTIVE SEED IMPLANT N/A 02/01/2018   Procedure: RADIOACTIVE SEED IMPLANT/BRACHYTHERAPY IMPLANT;  Surgeon: Alexis Frock, MD;  Location: [facility];  Service: Urology;  Laterality: N/A;  58 seeds implanted  . SPACE OAR INSTILLATION N/A 02/01/2018   Procedure: SPACE OAR INSTILLATION;  Surgeon: Alexis Frock, MD;  Location: [facility];  Service: Urology;  Laterality: N/A;  . STERNOTOMY  12/14/2018  . THYROIDECTOMY  12/14/2018  . WISDOM TOOTH EXTRACTION     x2, Upper    Social History   Socioeconomic History  . Marital status: Single    Spouse name: Not on file  . Number of children: 2  . Years of education: PHD  . Highest education level: Not on file  Occupational History  . Occupation: Retired: Prof [employer]  Tobacco Use  . Smoking status: Never Smoker  . Smokeless tobacco: Never Used  Substance and Sexual Activity  . Alcohol use: No    Alcohol/week: 0.0 standard drinks    Comment: not in  years  .  Drug use: No  . Sexual activity: Not Currently  Other Topics Concern  . Not on file  Social History Narrative   2 children, one in [place] w/ pt   Patient is retired.   Education PHD    Right handed.   Caffeine Three cups of coffee daily.   Social Determinants of Health   Financial Resource Strain:   . Difficulty of Paying Living Expenses:   Food Insecurity:   . Worried About Charity fundraiser in the Last Year:   . Arboriculturist in the Last Year:   Transportation Needs:   . Film/video editor (Medical):   Marland Kitchen Lack of Transportation (Non-Medical):   Physical Activity:   . Days of Exercise per  Week:   . Minutes of Exercise per Session:   Stress:   . Feeling of Stress :   Social Connections:   . Frequency of Communication with Friends and Family:   . Frequency of Social Gatherings with Friends and Family:   . Attends Religious Services:   . Active Member of Clubs or Organizations:   . Attends Archivist Meetings:   Marland Kitchen Marital Status:   Intimate Partner Violence:   . Fear of Current or Ex-Partner:   . Emotionally Abused:   Marland Kitchen Physically Abused:   . Sexually Abused:       Allergies as of 06/05/2019   No Known Allergies     Medication List       Accurate as of June 05, 2019  2:05 PM. If you have any questions, ask your nurse or doctor.        amLODipine-olmesartan 10-40 MG tablet Commonly known as: AZOR Take 1 tablet by mouth daily.   BROMFENAC SODIUM (ONCE-DAILY) OP Apply 1 drop to eye daily. Left eye only   calcium carbonate 500 MG chewable tablet Commonly known as: TUMS - dosed in mg elemental calcium Chew 1 tablet by mouth 3 (three) times daily with meals.   levETIRAcetam 500 MG 24 hr tablet Commonly known as: Keppra XR Take 3 tablets (1,500 mg total) by mouth at bedtime.   levothyroxine 150 MCG tablet Commonly known as: SYNTHROID Take 150 mcg by mouth daily before breakfast.   Lupron Depot (63-Month) 11.25 MG injection Generic drug: leuprolide Inject into the muscle. As directed   metoprolol tartrate 25 MG tablet Commonly known as: LOPRESSOR Take 12.5 mg by mouth 2 (two) times daily.   tamsulosin 0.4 MG Caps capsule Commonly known as: FLOMAX Take 0.4 mg by mouth daily.          Objective:   Physical Exam BP 130/78   Pulse 69   Temp (!) 97 F (36.1 C) (Oral)   Ht 5\' 11"  (1.803 m)   Wt 196 lb (88.9 kg)   BMI 27.34 kg/m  This is a virtual video visit, he is alert oriented x3, no apparent distress.    Assessment      Assessment  HTN Paroxysmal A. fib: DX 12-2018, postop.  No anticoagulation or ASA as of  01/24/2019 Seizure disorder, last activity 1998 R hearing loss, h/o ENT eval (-) Intracranial bleed due to fall 2013 Anemia -- Dr. Paulita Fujita Oncology: -Prostate cancer: XRT 2019, rx  lupron -Papillary  thyroid cancer, s/p total thyroidectomy, sternotomy, mediastinal lymph dissection on 12/14/2018 H/o retina detachment  H/o back pain, NPH, no surgery 2008 H/o   moles in the back, saw  Dermatology 2015, told ok,  was not rec to schedule a RTC   PLAN Allergic rhinitis: Nasal congestion with no other symptoms that could suggest a viral syndrome, likely allergies.  In the past, Flonase failed to help.  OTC decongestant helping. Plan: Start Astelin twice a day, discontinue the over-the-counter decongestant although he could use it at nighttime for the next 2 to 3 days until Astelin kicks in. Call if not improving H/O papillary thyroid cancer: Endocrinology note from 02/18/2019 reviewed, he is low risk for recurrence  I discussed the assessment and treatment plan with the patient. The patient was provided an opportunity to ask questions and all were answered. The patient agreed with the plan and demonstrated an understanding of the instructions.   The patient was advised to call back or seek an in-person evaluation if the symptoms worsen or if the condition fails to improve as anticipated.

## 2019-06-06 NOTE — Assessment & Plan Note (Signed)
Allergic rhinitis: Nasal congestion with no other symptoms that could suggest a viral syndrome, likely allergies.  In the past, Flonase failed to help.  OTC decongestant helping. Plan: Start Astelin twice a day, discontinue the over-the-counter decongestant although he could use it at nighttime for the next 2 to 3 days until Astelin kicks in. Call if not improving H/O papillary thyroid cancer: Endocrinology note from 02/18/2019 reviewed, he is low risk for recurrence

## 2019-06-10 DIAGNOSIS — C61 Malignant neoplasm of prostate: Secondary | ICD-10-CM | POA: Diagnosis not present

## 2019-06-18 ENCOUNTER — Telehealth: Payer: Self-pay | Admitting: Internal Medicine

## 2019-06-18 MED ORDER — CROMOLYN SODIUM 5.2 MG/ACT NA AERS: 1.0000 | INHALATION_SPRAY | Freq: Three times a day (TID) | NASAL | 12 refills | Status: DC

## 2019-06-18 NOTE — Telephone Encounter (Signed)
Pt states that Larose Kells put him on azelastine (ASTELIN) 0.1 % nasal spray     But he is still having daily nasal congestion and wants to know if it was something else he could send in to help with the seasonal allergies. Please advise his  Cell   [phone]

## 2019-06-18 NOTE — Telephone Encounter (Signed)
Let patient know, I sent a prescription for cromolyn nasal spray, use 3 times a day in addition to Astelin. If not improving, will refer him to the allergist

## 2019-06-18 NOTE — Telephone Encounter (Signed)
Spoke w/ Pt- informed of recommendations. Pt verbalized understanding.  

## 2019-06-18 NOTE — Telephone Encounter (Signed)
Please advise 

## 2019-06-21 ENCOUNTER — Encounter: Payer: Self-pay | Admitting: Internal Medicine

## 2019-07-15 NOTE — Progress Notes (Signed)
PATIENT: Joel [name] DOB: 18-Aug-1945  REASON FOR VISIT: follow up HISTORY FROM: patient  HISTORY OF PRESENT ILLNESS: Today 07/16/19  HISTORY HISTORYRobert D [name] isa 74 years old right handed [place] American male with a history of seizures beginning since March of 1993. Previously patients of Dr. of Dr. Erling Cruz, last clinical visit was February 2014. He would get up in the middle of the night to go the bathroom and awakened the next morning in bed with his tongue biten. Evaluation in 1993 with sleep deprived EEG and CAT scan of the brain without and with contrast were normal. 2-D echocardiogram and Holter monitoring evaluation and treadmill were normal. He had a similar episode of blacking out in the middle of the night in May of 1993. In May of 1994 he was admitted to [facility]. At that time he had an episode during the daytime and had loss of consciousness with jerking of his arms and legs and bit his tongue without urinary incontinence. EEG was normal and he was placed on Dilantin since 1994. He developed transient elevated liver function tests with GGT 232. EEG 08/22/95 was normal. MRI study of the brain 05/21/96 showed a small left arachnoid cyst in the anterior temporal fossa, and questionable atrohy in the left hippocampus.  He had a seizure at his wife's funeral in March of 1998. He denies macropsia, micropsia, dj vu, strange odors or tastes.   Phenytoin level 06/23/09 was 9.0. DEXA scan 06/30/2009 revealed normal T. Scores. He goes to a fitness center 7 days per week doing cardio, strength training, and "spins". He can balance on the balls of his feet for a long time.  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 He is independent in activities of daily living. He denies macropsia,micropsia, strange odors or tastes. He had sudden right ear hearing loss in 07/2010 and underwent MRI study 07/2010 and 08/31/2010 which  was unremarkable except for small arachnoid cyst in the anterior aspect of the left middle fossa .He underwent a course of prednisone without benefit. In mid December 2012 he got up in the middle of the night ,was on his toilet to void, stood up and next remembers awaking on the floor. He went back to bed and the next morning noted blood on his pillow from a posterior head injury. CT scan showed a small left frontal bleed, thought to represent a contrecoup injury. He was admitted to [facility] 02/25/11 and seen in consultation by Dr. Sherley Bounds, neurosurgeon He was followed as an outpatient. After repeat CT scan showed improvement he was allowed to return to the fitness center.   Mid February 2013 he had problems walking without headache. He fell striking his head 05/12/2011 and developed worsening gait with a festinating quality. 05/19/2011, he fell in the driveway. 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 He underwent craniotomy 3/15 by Dr. Jovita Gamma. He was discharged 06/02/2011.  After discharge he noted intermittent tingling in his left hand,face, and arm, lasting 30 seconds, 4 times per day.He had slurred speech with the episodes. CT scan 06/06/11 showed bilateral subdurals right greater than left of 10 mm with 5 mm right to left midline shift. There was no evidence of new hemorrhage.   06/09/2011, he had a witnessed generalized major motor seizure and was seen in the [place] with a phenytoin level of approximate 8.Keppra 750 mg  twice a day was added on since then. In combination with his Dilantin 100 mg 2 tablets twice a day. Repeat CT scan 5/13 showed SDHs had decreased and a density in the right frontal region of unknown etiology .CT scan 09/12/2011 showed resolution of tiny left SDH, interval decrease in the small, improving white matter edema in the right frontal lobe, and a small infarct in the left frontal lobe was slightly more prominent.He is  exercising daily. No seizures. He underwent scleral buckling OS for retinal detachment by Dr. Zadie Rhine and has left eye cataract to be followed by Dr. Katy Fitch. Bone density 01/12/2012 was normal  UPDATE April 30th 2015:YYHe had a right eye cataract surgery in 2014, recovering very well,he has no recurrent seizures,he is now taking Keppra xr 750mg  bid, and Dilantin 100 mg 2 tablets twice a day. He wants to consider tapering off Dilantin. He still exercise daily, 2-3 hours daily.  UPDATE May 2nd 2016:YYHe continue to exercise, has taper of Dilantin now, taking Keppra xr 500 mg 2 tablets twice a day, no significant side effect  UPDATE 05/02/2017CMMr. [name] returns for yearly follow-up for his seizure disorder. He has previously been followed by Dr.Yan and Dr. Erling Cruz. Records reviewed Now taking Keppra XR 750 mg twice daily without side effects. Last seizure activity March 2013. He continues to exercise twice a day. No new neurologic complaints he returns for reevaluation UPDATE 04/26/2018CMMr.[name], 74 year old male returns for follow-up with history of seizure disorder. He also has a history of subdural hematoma in 2013 with evacuation. His last seizure event was in March 2013. He eats healthy He continues to exercise twice a day. He is currently on Keppra extended release 500 mg 3 tablets at bedtime. He denies any side effects to the medication. He has had no interval medical problems since last seen. He returns for reevaluation UPDATE4/29/2019CMMr. [name], 74 year old male returns for follow-up with history of seizure disorder. He had a subdural hematoma in 2013 evacuation. His last seizure occurred in March 2013. He continues to exercise twice a day and eat healthy. He is sleeping well at night. He is currently on Keppra extended release without side effects. He is due to have a prostate biopsy for elevated PSA soon. No other interval medical issues.He returns for  reevaluation UPDATE 07/16/2018 SS: Joel [name] 74 year old male with history of seizure disorder. His last seizure was in March 2013. He has not had any recurrent seizures. He continues taking Keppra XR 500 mg, 3 tablets at bedtime. Tolerating the medication well. He has finished radiation for prostate cancer. He is retired, enjoys being active, working out, he has written 3 books. He drives a car without difficulty, his son lives with him. He needs a refill on his Keppra. He is getting a hormone injection, Lupron for the next year.   Update Jul 16, 2019 SS: Joel [name] is a 74 year old male with history of seizure disorder.  His last seizure was in March 2013.  He remains on Keppra XR 500 mg, 3 tablets at bedtime. He has since had his thyroid removed since last time back in October, he got his last Lupron shot recently for prostate cancer, will be having PSA rechecked soon. Had AFIB, post op thyroid, AFIB was transient. He is still active, is finishing his 4th look, about love poems.  He drives a car without difficulty, his son lives with him.  He needs a refill on Keppra.  REVIEW OF SYSTEMS: Out of a complete 14 system review of  symptoms, the patient complains only of the following symptoms, and all other reviewed systems are negative.  Seizure   ALLERGIES: No Known Allergies  HOME MEDICATIONS: Outpatient Medications Prior to Visit  Medication Sig Dispense Refill  . amLODipine-olmesartan (AZOR) 10-40 MG tablet Take 1 tablet by mouth daily. 90 tablet 3  . BROMFENAC SODIUM, ONCE-DAILY, OP Apply 1 drop to eye daily. Left eye only    . leuprolide (LUPRON DEPOT, 18-MONTH,) 11.25 MG injection Inject into the muscle. As directed    . levothyroxine (SYNTHROID) 150 MCG tablet Take 150 mcg by mouth daily before breakfast.    . metoprolol tartrate (LOPRESSOR) 25 MG tablet Take 12.5 mg by mouth 2 (two) times daily.    . tamsulosin (FLOMAX) 0.4 MG CAPS capsule Take 0.4 mg by mouth daily.    Marland Kitchen  levETIRAcetam (KEPPRA XR) 500 MG 24 hr tablet Take 3 tablets (1,500 mg total) by mouth at bedtime. 270 tablet 3  . azelastine (ASTELIN) 0.1 % nasal spray Place 2 sprays into both nostrils 2 (two) times daily. 30 mL 6  . calcium carbonate (TUMS - DOSED IN MG ELEMENTAL CALCIUM) 500 MG chewable tablet Chew 1 tablet by mouth 3 (three) times daily with meals.    . cromolyn (NASALCROM) 5.2 MG/ACT nasal spray Place 1 spray into both nostrils 3 (three) times daily. 26 mL 12   No facility-administered medications prior to visit.    PAST MEDICAL HISTORY: Past Medical History:  Diagnosis Date  . Anemia SEVERAL YRS AGO   Dr. Paulita Fujita  . 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 Fall 05-2011    had a IC bleed   . Hearing loss    Rt ear sensorineural hearing loss, . W/u by Dr. Remer Macho extensively negative  . HTN (hypertension)   . Prostate cancer ([place]) 2019  . Seizure disorder ([place])    Dx remotely, last SZ activity aprox 1998  . 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 Thyroid cancer ([place]) 2020    PAST SURGICAL HISTORY: Past Surgical History:  Procedure Laterality Date  . CATARACT EXTRACTION Bilateral    R 2014, L 03-2013  . CRANIOTOMY  05/27/2011   Procedure: CRANIOTOMY HEMATOMA EVACUATION SUBDURAL;  Surgeon: Hosie Spangle, MD;  Location: [place] [facility];  Service: Neurosurgery;  Laterality: Right;  Evacuation of Subdural Hematoma  . CYSTOSCOPY N/A 02/01/2018   Procedure: CYSTOSCOPY;  Surgeon: Alexis Frock, MD;  Location: [facility];  Service: Urology;  Laterality: N/A;  no seeds in bladder per Dr Tresa Moore  . EYE SURGERY  11-2011    for retinal detachment , L, Dr Zadie Rhine  . HERNIA REPAIR     as an infant  . NECK DISSECTION  12/14/2018  . PROSTATE BIOPSY  2019  . RADIOACTIVE SEED IMPLANT N/A 02/01/2018   Procedure: RADIOACTIVE SEED IMPLANT/BRACHYTHERAPY IMPLANT;  Surgeon: Alexis Frock, MD;  Location: [facility];  Service: Urology;  Laterality: N/A;  58  seeds implanted  . SPACE OAR INSTILLATION N/A 02/01/2018   Procedure: SPACE OAR INSTILLATION;  Surgeon: Alexis Frock, MD;  Location: [facility];  Service: Urology;  Laterality: N/A;  . STERNOTOMY  12/14/2018  . THYROIDECTOMY  12/14/2018  . WISDOM TOOTH EXTRACTION     x2, Upper    FAMILY HISTORY: Family History  Problem Relation Age of Onset  . Emphysema Mother        smoker  . Hypertension Father  F and B   . Leukemia Sister   . Prostate cancer Brother 89  . Diabetes Neg Hx   . Colon cancer Neg Hx   . Coronary artery disease Neg Hx     SOCIAL HISTORY: Social History   Socioeconomic History  . Marital status: Single    Spouse name: Not on file  . Number of children: 2  . Years of education: PHD  . Highest education level: Not on file  Occupational History  . Occupation: Retired: Prof [employer]  Tobacco Use  . Smoking status: Never Smoker  . Smokeless tobacco: Never Used  Substance and Sexual Activity  . Alcohol use: No    Alcohol/week: 0.0 standard drinks    Comment: not in  years  . Drug use: No  . Sexual activity: Not Currently  Other Topics Concern  . Not on file  Social History Narrative   2 children, one in [place] w/ pt   Patient is retired.   Education PHD    Right handed.   Caffeine Three cups of coffee daily.   Social Determinants of Health   Financial Resource Strain:   . Difficulty of Paying Living Expenses:   Food Insecurity:   . Worried About Charity fundraiser in the Last Year:   . Arboriculturist in the Last Year:   Transportation Needs:   . Film/video editor (Medical):   Marland Kitchen Lack of Transportation (Non-Medical):   Physical Activity:   . Days of Exercise per Week:   . Minutes of Exercise per Session:   Stress:   . Feeling of Stress :   Social Connections:   . Frequency of Communication with Friends and Family:   . Frequency of Social Gatherings with Friends and Family:   . Attends  Religious Services:   . Active Member of Clubs or Organizations:   . Attends Archivist Meetings:   Marland Kitchen Marital Status:   Intimate Partner Violence:   . Fear of Current or Ex-Partner:   . Emotionally Abused:   Marland Kitchen Physically Abused:   . Sexually Abused:       PHYSICAL EXAM  Vitals:   07/16/19 1527  BP: 118/74  Pulse: 74  Temp: (!) 97 F (36.1 C)  Weight: 206 lb (93.4 kg)  Height: 6\' 1"  (1.854 m)   Body mass index is 27.18 kg/m.  Generalized: Well developed, in no acute distress   Neurological examination  Mentation: Alert oriented to time, place, history taking. Follows all commands speech and language fluent Cranial nerve II-XII: Pupils were equal round reactive to light. Extraocular movements were full, visual field were full on confrontational test. Facial sensation and strength were normal.  Head turning and shoulder shrug  were normal and symmetric. Motor: The motor testing reveals 5 over 5 strength of all 4 extremities. Good symmetric motor tone is noted throughout.  Sensory: Sensory testing is intact to soft touch on all 4 extremities. No evidence of extinction is noted.  Coordination: Cerebellar testing reveals good finger-nose-finger and heel-to-shin bilaterally.  Gait and station: Gait is normal.  Reflexes: Deep tendon reflexes are symmetric and normal bilaterally.   DIAGNOSTIC DATA (LABS, IMAGING, TESTING) - I reviewed patient records, labs, notes, testing and imaging myself where available.  Lab Results  Component Value Date   WBC 6.3 09/25/2018   HGB 12.2 (L) 09/25/2018   HCT 36.6 (L) 09/25/2018   MCV 94.1 09/25/2018   PLT 184.0  09/25/2018      Component Value Date/Time   NA 139 09/25/2018 1523   K 3.5 09/25/2018 1523   CL 106 09/25/2018 1523   CO2 23 09/25/2018 1523   GLUCOSE 90 09/25/2018 1523   BUN 13 09/25/2018 1523   CREATININE 0.89 09/25/2018 1523   CREATININE 0.96 07/21/2017 1537   CALCIUM 9.4 09/25/2018 1523   PROT 7.2 09/25/2018  1523   ALBUMIN 4.3 09/25/2018 1523   AST 18 09/25/2018 1523   ALT 12 09/25/2018 1523   ALKPHOS 101 09/25/2018 1523   BILITOT 0.9 09/25/2018 1523   GFRNONAA >60 01/25/2018 1106   GFRAA >60 01/25/2018 1106   Lab Results  Component Value Date   CHOL 190 09/25/2018   HDL 38.40 (L) 09/25/2018   LDLCALC 132 (H) 09/25/2018   LDLDIRECT 109.4 04/05/2012   TRIG 101.0 09/25/2018   CHOLHDL 5 09/25/2018   No results found for: HGBA1C No results found for: VITAMINB12 Lab Results  Component Value Date   TSH 1.12 09/25/2018    ASSESSMENT AND PLAN 74 y.o. year old male  has a past medical history of Anemia (SEVERAL YRS AGO), Back pain, Fall (05-2011 ), Hearing loss, HTN (hypertension), Prostate cancer ([place]) (2019), Seizure disorder ([place]), Subdural hematoma ([place]) (2013), and Thyroid cancer ([place]) (2020). here with:  1.  Seizure disorder -Continues to do very well, last seizure was in March 2013 -Continue Keppra XR 500 mg, 3 tablets at bedtime -Call for recurrent seizure, otherwise follow-up 1 year  I spent 20 minutes of face-to-face and non-face-to-face time with patient.  This included previsit chart review, lab review, study review, order entry, electronic health record documentation, patient education.  Butler Denmark, AGNP-C, DNP 07/16/2019, 3:56 PM [facility] [address] [place], [place] [address] [phone]

## 2019-07-16 ENCOUNTER — Ambulatory Visit: Payer: Medicare PPO | Admitting: Neurology

## 2019-07-16 ENCOUNTER — Encounter: Payer: Self-pay | Admitting: Neurology

## 2019-07-16 ENCOUNTER — Other Ambulatory Visit: Payer: Self-pay

## 2019-07-16 VITALS — BP 118/74 | HR 74 | Temp 97.0°F | Ht 73.0 in | Wt 206.0 lb

## 2019-07-16 DIAGNOSIS — G40909 Epilepsy, unspecified, not intractable, without status epilepticus: Secondary | ICD-10-CM

## 2019-07-16 MED ORDER — LEVETIRACETAM ER 500 MG PO TB24: 1500.0000 mg | ORAL_TABLET | Freq: Every day | ORAL | 3 refills | Status: DC

## 2019-07-16 NOTE — Patient Instructions (Signed)
It was great to see you today! Continue current medication  See you back in 1 year or sooner if needed

## 2019-07-23 NOTE — Progress Notes (Signed)
I have reviewed and agreed above plan. 

## 2019-08-19 DIAGNOSIS — C73 Malignant neoplasm of thyroid gland: Secondary | ICD-10-CM | POA: Diagnosis not present

## 2019-08-19 DIAGNOSIS — E890 Postprocedural hypothyroidism: Secondary | ICD-10-CM | POA: Diagnosis not present

## 2019-09-22 ENCOUNTER — Other Ambulatory Visit: Payer: Self-pay | Admitting: Internal Medicine

## 2019-09-27 ENCOUNTER — Other Ambulatory Visit: Payer: Self-pay

## 2019-09-27 ENCOUNTER — Ambulatory Visit (INDEPENDENT_AMBULATORY_CARE_PROVIDER_SITE_OTHER): Payer: Medicare PPO | Admitting: Internal Medicine

## 2019-09-27 ENCOUNTER — Encounter: Payer: Self-pay | Admitting: Internal Medicine

## 2019-09-27 VITALS — BP 132/82 | HR 79 | Temp 98.7°F | Resp 16 | Ht 73.0 in | Wt 211.2 lb

## 2019-09-27 DIAGNOSIS — D649 Anemia, unspecified: Secondary | ICD-10-CM | POA: Diagnosis not present

## 2019-09-27 DIAGNOSIS — Z0000 Encounter for general adult medical examination without abnormal findings: Secondary | ICD-10-CM | POA: Diagnosis not present

## 2019-09-27 DIAGNOSIS — J302 Other seasonal allergic rhinitis: Secondary | ICD-10-CM

## 2019-09-27 LAB — COMPREHENSIVE METABOLIC PANEL
ALT: 17 U/L (ref 0–53)
AST: 22 U/L (ref 0–37)
Albumin: 4.4 g/dL (ref 3.5–5.2)
Alkaline Phosphatase: 104 U/L (ref 39–117)
BUN: 16 mg/dL (ref 6–23)
CO2: 27 mEq/L (ref 19–32)
Calcium: 10.1 mg/dL (ref 8.4–10.5)
Chloride: 106 mEq/L (ref 96–112)
Creatinine, Ser: 0.9 mg/dL (ref 0.40–1.50)
GFR: 100.22 mL/min (ref >60.00)
Glucose, Bld: 108 mg/dL — ABNORMAL HIGH (ref 70–99)
Potassium: 4.2 mEq/L (ref 3.5–5.1)
Sodium: 140 mEq/L (ref 135–145)
Total Bilirubin: 0.8 mg/dL (ref 0.2–1.2)
Total Protein: 7.5 g/dL (ref 6.0–8.3)

## 2019-09-27 LAB — CBC WITH DIFFERENTIAL/PLATELET
Basophils Absolute: 0 10*3/uL (ref 0.0–0.1)
Basophils Relative: 0.3 % (ref 0.0–3.0)
Eosinophils Absolute: 0.1 10*3/uL (ref 0.0–0.7)
Eosinophils Relative: 1.9 % (ref 0.0–5.0)
HCT: 36.8 % — ABNORMAL LOW (ref 39.0–52.0)
Hemoglobin: 12.1 g/dL — ABNORMAL LOW (ref 13.0–17.0)
Lymphocytes Relative: 15.4 % (ref 12.0–46.0)
Lymphs Abs: 1 10*3/uL (ref 0.7–4.0)
MCHC: 32.8 g/dL (ref 30.0–36.0)
MCV: 93.9 fl (ref 78.0–100.0)
Monocytes Absolute: 0.7 10*3/uL (ref 0.1–1.0)
Monocytes Relative: 11.8 % (ref 3.0–12.0)
Neutro Abs: 4.5 10*3/uL (ref 1.4–7.7)
Neutrophils Relative %: 70.6 % (ref 43.0–77.0)
Platelets: 180 10*3/uL (ref 150.0–400.0)
RBC: 3.92 Mil/uL — ABNORMAL LOW (ref 4.22–5.81)
RDW: 13.2 % (ref 11.5–15.5)
WBC: 6.3 10*3/uL (ref 4.0–10.5)

## 2019-09-27 LAB — LIPID PANEL
Cholesterol: 187 mg/dL (ref 0–200)
HDL: 46.7 mg/dL (ref >39.00)
LDL Cholesterol: 119 mg/dL — ABNORMAL HIGH (ref 0–99)
NonHDL: 140.16
Total CHOL/HDL Ratio: 4
Triglycerides: 105 mg/dL (ref 0.0–149.0)
VLDL: 21 mg/dL (ref 0.0–40.0)

## 2019-09-27 NOTE — Progress Notes (Signed)
Pre visit review using our clinic review tool, if applicable. No additional management support is needed unless otherwise documented below in the visit note. 

## 2019-09-27 NOTE — Progress Notes (Signed)
Subjective:    Patient ID: Joel [name], male    DOB: 12-Nov-1945, 74 y.o.   MRN: [id number]  DOS:  09/27/2019 Type of visit - description: cpx Here for CPX, doing great, The one thing that bothers him is chronic nasal congestion, at times severe, unable to sleep. Phenylephrine topical helps.   Review of Systems  Other than above, a 14 point review of systems is negative      Past Medical History:  Diagnosis Date  . Anemia SEVERAL YRS AGO   Dr. Paulita Fujita  . 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 Fall 05-2011    had a IC bleed   . Hearing loss    Rt ear sensorineural hearing loss, . W/u by Dr. Remer Macho extensively negative  . HTN (hypertension)   . Prostate cancer ([place]) 2019  . Seizure disorder ([place])    Dx remotely, last SZ activity aprox 1998  . Subdural hematoma ([place]) 2013  . Thyroid cancer ([place]) 2020    Past Surgical History:  Procedure Laterality Date  . CATARACT EXTRACTION Bilateral    R 2014, L 03-2013  . CRANIOTOMY  05/27/2011   Procedure: CRANIOTOMY HEMATOMA EVACUATION SUBDURAL;  Surgeon: Hosie Spangle, MD;  Location: [facility];  Service: Neurosurgery;  Laterality: Right;  Evacuation of Subdural Hematoma  . CYSTOSCOPY N/A 02/01/2018   Procedure: CYSTOSCOPY;  Surgeon: Alexis Frock, MD;  Location: [facility];  Service: Urology;  Laterality: N/A;  no seeds in bladder per Dr Tresa Moore  . EYE SURGERY  11-2011    for retinal detachment , L, Dr Zadie Rhine  . HERNIA REPAIR     as an infant  . NECK DISSECTION  12/14/2018  . PROSTATE BIOPSY  2019  . RADIOACTIVE SEED IMPLANT N/A 02/01/2018   Procedure: RADIOACTIVE SEED IMPLANT/BRACHYTHERAPY IMPLANT;  Surgeon: Alexis Frock, MD;  [facility]: [facility];  Service: Urology;  Laterality: N/A;  58 seeds implanted  . SPACE OAR INSTILLATION N/A 02/01/2018   Procedure: SPACE OAR INSTILLATION;  Surgeon: Alexis Frock, MD;  Location: [facility];  Service:  Urology;  Laterality: N/A;  . STERNOTOMY  12/14/2018  . THYROIDECTOMY  12/14/2018  . WISDOM TOOTH EXTRACTION     x2, Upper    Allergies as of 09/27/2019   No Known Allergies     Medication List       Accurate as of September 27, 2019 11:59 PM. If you have any questions, ask your nurse or doctor.        amLODipine-olmesartan 10-40 MG tablet Commonly known as: AZOR Take 1 tablet by mouth daily.   BROMFENAC SODIUM (ONCE-DAILY) OP Apply 1 drop to eye daily. Left eye only   levETIRAcetam 500 MG 24 hr tablet Commonly known as: Keppra XR Take 3 tablets (1,500 mg total) by mouth at bedtime.   levothyroxine 150 MCG tablet Commonly known as: SYNTHROID Take 150 mcg by mouth daily before breakfast.   Lupron Depot (96-Month) 11.25 MG injection Generic drug: leuprolide Inject into the muscle. As directed   metoprolol tartrate 25 MG tablet Commonly known as: LOPRESSOR Take 12.5 mg by mouth 2 (two) times daily.   tamsulosin 0.4 MG Caps capsule Commonly known as: FLOMAX Take 0.4 mg by mouth daily.          Objective:   Physical Exam BP 132/82 (BP Location: Left Arm, Patient Position: Sitting, Cuff Size: Normal)   Pulse 79   Temp 98.7  F (37.1 C) (Oral)   Resp 16   Ht 6\' 1"  (1.854 m)   Wt 211 lb 4 oz (95.8 kg)   SpO2 97%   BMI 27.87 kg/m   General:   Well developed, NAD, BMI noted. HEENT:  Normocephalic . Face symmetric, atraumatic Nose: Moderate congested, turbinates large, mild clear discharge noted Lungs:  CTA B Normal respiratory effort, no intercostal retractions, no accessory muscle use. Heart: RRR,  no murmur.  Lower extremities: no pretibial edema bilaterally  Skin: Not pale. Not jaundice Neurologic:  alert & oriented X3.  Speech normal, gait appropriate for age and unassisted Psych--  Cognition and judgment appear intact.  Cooperative with normal attention span and concentration.  Behavior appropriate. No anxious or depressed appearing.       Assessment     Assessment  HTN Paroxysmal A. fib: DX 12-2018, postop.  No anticoagulation or ASA as of 01/24/2019 Seizure disorder, last activity 1998 R hearing loss, h/o ENT eval (-) Intracranial bleed due to fall 2013 Anemia -- Dr. Paulita Fujita Oncology: -Prostate cancer: XRT 2019, rx  lupron -Papillary  thyroid cancer, s/p total thyroidectomy, sternotomy, mediastinal lymph dissection on 12/14/2018 H/o retina detachment  H/o back pain, NPH, no surgery 2008 H/o   moles in the back, saw  Dermatology 2015, told ok, was not rec to schedule a RTC   PLAN Here for CPX HTN: Continue metoprolol and Azor, BP today is very good, very good in the ambulatory setting as well. Paroxysmal A. fib: Asx, pulse regular today, to see cardiology in few months, on beta-blockers, not anticoagulated. Prostate cancer: per urology and rad therapy  Papillary thyroid carcinoma: Last visit with Endo 08/2019 Allergic rhinitis see last visit, did not respond to Astelin at all, sxs at times severe, states the only thing that helps is phenylephrine topical.  Advised about the potential consequences of long-term use of such medication, we agreed to refer to ENT Dr. Wilburn Cornelia, may need further evaluation. Anemia: h/o, felt to be due to blood donation which he has not done in a while.  Not taking iron supplements, checking labs RTC 1 year.    This visit occurred during the SARS-CoV-2 public health emergency.  Safety protocols were in place, including screening questions prior to the visit, additional usage of staff PPE, and extensive cleaning of exam room while observing appropriate contact time as indicated for disinfecting solutions.

## 2019-09-27 NOTE — Patient Instructions (Signed)
We are referring you back to Dr. Wilburn Cornelia due to chronic rhinitis  GO TO THE LAB : Get the blood work     [place], [place] back for a physical exam in 1 year

## 2019-09-28 ENCOUNTER — Encounter: Payer: Self-pay | Admitting: Internal Medicine

## 2019-09-28 LAB — IRON,TIBC AND FERRITIN PANEL
%SAT: 33 % (calc) (ref 20–48)
Ferritin: 41 ng/mL (ref 24–380)
Iron: 113 ug/dL (ref 50–180)
TIBC: 346 mcg/dL (calc) (ref 250–425)

## 2019-09-28 NOTE — Assessment & Plan Note (Signed)
-  Tdap 2019 - pnm 23: 2014 - prevnar 2016 - zostavax :2014 - shingrex discussed before - s/p covid vaccines  - flu shot rec q year   --CCS: colonoscopy 2003, cscope   04-2012, tubular adenoma, again 06-2015 -- next 5 year per pt (GI  Dr Paulita Fujita) -- prostate cancer: per urology team  --Labs: CMP, FLP, CBC, TIBC. --Continue with healthy lifestyle

## 2019-09-28 NOTE — Assessment & Plan Note (Signed)
Here for CPX HTN: Continue metoprolol and Azor, BP today is very good, very good in the ambulatory setting as well. Paroxysmal A. fib: Asx, pulse regular today, to see cardiology in few months, on beta-blockers, not anticoagulated. Prostate cancer: per urology and rad therapy  Papillary thyroid carcinoma: Last visit with Endo 08/2019 Allergic rhinitis see last visit, did not respond to Astelin at all, sxs at times severe, states the only thing that helps is phenylephrine topical.  Advised about the potential consequences of long-term use of such medication, we agreed to refer to ENT Dr. Wilburn Cornelia, may need further evaluation. Anemia: h/o, felt to be due to blood donation which he has not done in a while.  Not taking iron supplements, checking labs RTC 1 year.

## 2019-10-21 DIAGNOSIS — E890 Postprocedural hypothyroidism: Secondary | ICD-10-CM | POA: Diagnosis not present

## 2019-10-21 DIAGNOSIS — C73 Malignant neoplasm of thyroid gland: Secondary | ICD-10-CM | POA: Diagnosis not present

## 2019-10-24 ENCOUNTER — Other Ambulatory Visit: Payer: Self-pay

## 2019-10-24 ENCOUNTER — Ambulatory Visit (INDEPENDENT_AMBULATORY_CARE_PROVIDER_SITE_OTHER): Payer: Medicare PPO | Admitting: Ophthalmology

## 2019-10-24 ENCOUNTER — Encounter (INDEPENDENT_AMBULATORY_CARE_PROVIDER_SITE_OTHER): Payer: Self-pay | Admitting: Ophthalmology

## 2019-10-24 DIAGNOSIS — H31009 Unspecified chorioretinal scars, unspecified eye: Secondary | ICD-10-CM | POA: Insufficient documentation

## 2019-10-24 DIAGNOSIS — H31002 Unspecified chorioretinal scars, left eye: Secondary | ICD-10-CM | POA: Diagnosis not present

## 2019-10-24 DIAGNOSIS — H43811 Vitreous degeneration, right eye: Secondary | ICD-10-CM

## 2019-10-24 DIAGNOSIS — H35352 Cystoid macular degeneration, left eye: Secondary | ICD-10-CM | POA: Diagnosis not present

## 2019-10-24 DIAGNOSIS — Z961 Presence of intraocular lens: Secondary | ICD-10-CM

## 2019-10-24 DIAGNOSIS — Z8669 Personal history of other diseases of the nervous system and sense organs: Secondary | ICD-10-CM | POA: Insufficient documentation

## 2019-10-24 MED ORDER — BROMFENAC SODIUM (ONCE-DAILY) 0.09 % OP SOLN: 1.0000 [drp] | Freq: Every day | OPHTHALMIC | 0 refills | Status: AC

## 2019-10-24 NOTE — Assessment & Plan Note (Signed)

## 2019-10-24 NOTE — Assessment & Plan Note (Signed)
History of chronic edema and recurrent CME left eye reasonably controlled on once daily bromfenac.  Emanation of the iris OS with transillumination defects could represent a site of the iris haptic chafe thus I would consider recommending the bromfenac once daily.  He could attempt to taper to once every other day perhaps odd-numbered days of the year.

## 2019-10-24 NOTE — Progress Notes (Signed)
10/24/2019     CHIEF COMPLAINT Patient presents for Retina Follow Up   HISTORY OF PRESENT ILLNESS: Joel [name] is a 74 y.o. male who presents to the clinic today for:   HPI    Retina Follow Up    Diagnosis: CME.  In left eye.  Severity is moderate.  Duration of 1 year.  Since onset it is stable.  I, the attending physician,  performed the HPI with the patient and updated documentation appropriately.          Comments    1 Year f\u OU. OCT  Pt states vision is doing well. Pt has no complaints. Using gtts as directed. Needs refill       Last edited by Tilda Franco on 10/24/2019  2:50 PM. (History)      Referring physician: Colon Branch, MD [address] [place],  [place] [address]  HISTORICAL INFORMATION:   Selected notes from the MEDICAL RECORD NUMBER       CURRENT MEDICATIONS: Current Outpatient Medications (Ophthalmic Drugs)  Medication Sig  . Bromfenac Sodium 0.09 % SOLN Apply 1 drop to eye daily. Left eye only   No current facility-administered medications for this visit. (Ophthalmic Drugs)   Current Outpatient Medications (Other)  Medication Sig  . amLODipine-olmesartan (AZOR) 10-40 MG tablet Take 1 tablet by mouth daily.  Marland Kitchen leuprolide (LUPRON DEPOT, 73-MONTH,) 11.25 MG injection Inject into the muscle. As directed  . levETIRAcetam (KEPPRA XR) 500 MG 24 hr tablet Take 3 tablets (1,500 mg total) by mouth at bedtime.  Marland Kitchen levothyroxine (SYNTHROID) 150 MCG tablet Take 150 mcg by mouth daily before breakfast.  . metoprolol tartrate (LOPRESSOR) 25 MG tablet Take 12.5 mg by mouth 2 (two) times daily.  . tamsulosin (FLOMAX) 0.4 MG CAPS capsule Take 0.4 mg by mouth daily.   No current facility-administered medications for this visit. (Other)      REVIEW OF SYSTEMS:    ALLERGIES No Known Allergies  PAST MEDICAL HISTORY Past Medical History:  Diagnosis Date  . Anemia SEVERAL YRS AGO   Dr. Paulita Fujita  . Back pain    HNP dx ~2008, no  surgery RESOLVED NOW  . Fall 05-2011    had a IC bleed   . Hearing loss    Rt ear sensorineural hearing loss, . W/u by Dr. Remer Macho extensively negative  . HTN (hypertension)   . Prostate cancer ([place]) 2019  . Seizure disorder ([place])    Dx remotely, last SZ activity aprox 1998  . Subdural hematoma ([place]) 2013  . Thyroid cancer ([place]) 2020   Past Surgical History:  Procedure Laterality Date  . CATARACT EXTRACTION Bilateral    R 2014, L 03-2013  . CRANIOTOMY  05/27/2011   Procedure: CRANIOTOMY HEMATOMA EVACUATION SUBDURAL;  Surgeon: Hosie Spangle, MD;  Location: [place];  Service: Neurosurgery;  Laterality: Right;  Evacuation of Subdural Hematoma  . CYSTOSCOPY N/A 02/01/2018   Procedure: CYSTOSCOPY;  Surgeon: Alexis Frock, MD;  Location: [facility];  Service: Urology;  Laterality: N/A;  no seeds in bladder per Dr Tresa Moore  . EYE SURGERY  11-2011    for retinal detachment , L, Dr Zadie Rhine  . HERNIA REPAIR     as an infant  . NECK DISSECTION  12/14/2018  . PROSTATE BIOPSY  2019  . RADIOACTIVE SEED IMPLANT N/A 02/01/2018   Procedure: RADIOACTIVE SEED IMPLANT/BRACHYTHERAPY IMPLANT;  Surgeon: Alexis Frock, MD;  Location: [facility];  Service: Urology;  Laterality: N/A;  58 seeds implanted  . SPACE OAR INSTILLATION N/A 02/01/2018   Procedure: SPACE OAR INSTILLATION;  Surgeon: Alexis Frock, MD;  Location: [facility];  Service: Urology;  Laterality: N/A;  . STERNOTOMY  12/14/2018  . THYROIDECTOMY  12/14/2018  . WISDOM TOOTH EXTRACTION     x2, Upper    FAMILY HISTORY Family History  Problem Relation Age of Onset  . Emphysema Mother        smoker  . Hypertension Father        F and B   . Leukemia Sister   . Prostate cancer Brother 66  . Diabetes Neg Hx   . Colon cancer Neg Hx   . Coronary artery disease Neg Hx     SOCIAL HISTORY Social History   Tobacco Use  . Smoking status: Never Smoker  . Smokeless tobacco: Never Used   Vaping Use  . Vaping Use: Never used  Substance Use Topics  . Alcohol use: No    Alcohol/week: 0.0 standard drinks    Comment: not in  years  . Drug use: No         OPHTHALMIC EXAM:  Base Eye Exam    Visual Acuity (Snellen - Linear)      Right Left   Dist  20/25 20/30 -2       Tonometry (Tonopen, 2:54 PM)      Right Left   Pressure 13 15       Pupils      Pupils Dark Light Shape React APD   Right PERRL 3 2.5 Round Sluggish None   Left PERRL 3 3 Round Minimal None       Visual Fields (Counting fingers)      Left Right    Full Full       Neuro/Psych    Oriented x3: Yes   Mood/Affect: Normal       Dilation    Both eyes: 1.0% Mydriacyl, 2.5% Phenylephrine @ 2:54 PM        Slit Lamp and Fundus Exam    External Exam      Right Left   External Normal Normal       Slit Lamp Exam      Right Left   Lids/Lashes Normal Normal   Conjunctiva/Sclera White and quiet White and quiet   Cornea Clear Clear   Anterior Chamber Deep and quiet Deep and quiet   Iris Round and reactive Round and reactive, TI defect at 730 meridian, could represent iris chafe from IOL   Lens Centered posterior chamber intraocular lens Centered posterior chamber intraocular lens   Anterior Vitreous Normal Normal       Fundus Exam      Right Left   Posterior Vitreous Posterior vitreous detachment Vitrectomized   Disc Normal Normal   C/D Ratio 0.65 0.55   Macula Normal Normal   Vessels Normal Normal   Periphery Normal Good chorioretinal scar, retinopexy and intact buckle, no breaks          IMAGING AND PROCEDURES  Imaging and Procedures for 10/24/19  OCT, Retina - OU - Both Eyes       Right Eye Quality was good. Scan locations included subfoveal. Central Foveal Thickness: 259. Progression has been stable. Findings include normal observations.   Left Eye Quality was good. Scan locations included subfoveal. Central Foveal Thickness: 251. Progression has been stable.    Notes PVD OD  OS No CME  ASSESSMENT/PLAN:  Cystoid macular edema of left eye History of chronic edema and recurrent CME left eye reasonably controlled on once daily bromfenac.  Emanation of the iris OS with transillumination defects could represent a site of the iris haptic chafe thus I would consider recommending the bromfenac once daily.  He could attempt to taper to once every other day perhaps odd-numbered days of the year.  Posterior vitreous detachment of right eye   The nature of posterior vitreous detachment was discussed with the patient as well as its physiology, its age prevalence, and its possible implication regarding retinal breaks and detachment.  An informational brochure was given to the patient.  All the patient's questions were answered.  The patient was asked to return if new or different flashes or floaters develops.   Patient was instructed to contact office immediately if any changes were noticed. I explained to the patient that vitreous inside the eye is similar to jello inside a bowl. As the jello melts it can start to pull away from the bowl, similarly the vitreous throughout our lives can begin to pull away from the retina. That process is called a posterior vitreous detachment. In some cases, the vitreous can tug hard enough on the retina to form a retinal tear. I discussed with the patient the signs and symptoms of a retinal detachment.  Do not rub the eye.      ICD-10-CM   1. 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 Pseudophakia of both eyes  Z96.1   4. Posterior vitreous detachment of right eye  H43.811   5. History of retinal detachment  Z86.69     1.  Patient to report to the office promptly should new symptoms of retinal tear or detachment occur or should new onset blurred vision occur  2.  3.  Ophthalmic Meds Ordered this visit:  Meds ordered this  encounter  Medications  . Bromfenac Sodium 0.09 % SOLN    Sig: Apply 1 drop to eye daily. Left eye only    Dispense:  18 mL    Refill:  0       Return in about 1 year (around 10/23/2020) for DILATE OU, OCT.  There are no Patient Instructions on file for this visit.   Explained the diagnoses, plan, and follow up with the patient and they expressed understanding.  Patient expressed understanding of the importance of proper follow up care.   Clent Demark Prince Olivier M.D. Diseases & Surgery of the Retina and Vitreous Retina & Diabetic [place] 10/24/19     Abbreviations: M myopia (nearsighted); A astigmatism; H hyperopia (farsighted); P presbyopia; Mrx spectacle prescription;  CTL contact lenses; OD right eye; OS left eye; OU both eyes  XT exotropia; ET esotropia; PEK punctate epithelial keratitis; PEE punctate epithelial erosions; DES dry eye syndrome; MGD meibomian gland dysfunction; ATs artificial tears; PFAT's preservative free artificial tears; [place] nuclear sclerotic cataract; PSC posterior subcapsular cataract; ERM epi-retinal membrane; PVD posterior vitreous detachment; RD retinal detachment; DM diabetes mellitus; DR diabetic retinopathy; NPDR non-proliferative diabetic retinopathy; PDR proliferative diabetic retinopathy; CSME clinically significant macular edema; DME diabetic macular edema; dbh dot blot hemorrhages; CWS cotton wool spot; POAG primary open angle glaucoma; C/D cup-to-disc ratio; HVF humphrey visual field; GVF goldmann visual field; OCT optical coherence tomography; IOP intraocular pressure; BRVO Branch retinal vein occlusion; CRVO central retinal vein occlusion; CRAO central retinal  artery occlusion; BRAO branch retinal artery occlusion; RT retinal tear; SB scleral buckle; PPV pars plana vitrectomy; VH Vitreous hemorrhage; PRP panretinal laser photocoagulation; IVK intravitreal kenalog; VMT vitreomacular traction; MH Macular hole;  NVD neovascularization of the disc; NVE  neovascularization elsewhere; AREDS age related eye disease study; ARMD age related macular degeneration; POAG primary open angle glaucoma; EBMD epithelial/anterior basement membrane dystrophy; ACIOL anterior chamber intraocular lens; IOL intraocular lens; PCIOL posterior chamber intraocular lens; Phaco/IOL phacoemulsification with intraocular lens placement; [place] photorefractive keratectomy; LASIK laser assisted in situ keratomileusis; HTN hypertension; DM diabetes mellitus; COPD chronic obstructive pulmonary disease

## 2019-11-05 DIAGNOSIS — C73 Malignant neoplasm of thyroid gland: Secondary | ICD-10-CM | POA: Diagnosis not present

## 2019-11-05 DIAGNOSIS — J310 Chronic rhinitis: Secondary | ICD-10-CM | POA: Diagnosis not present

## 2019-11-05 DIAGNOSIS — T485X5A Adverse effect of other anti-common-cold drugs, initial encounter: Secondary | ICD-10-CM | POA: Diagnosis not present

## 2019-11-05 DIAGNOSIS — J342 Deviated nasal septum: Secondary | ICD-10-CM | POA: Diagnosis not present

## 2019-11-05 DIAGNOSIS — J343 Hypertrophy of nasal turbinates: Secondary | ICD-10-CM | POA: Diagnosis not present

## 2019-12-03 DIAGNOSIS — C61 Malignant neoplasm of prostate: Secondary | ICD-10-CM | POA: Diagnosis not present

## 2019-12-03 LAB — PSA: PSA: <0.015

## 2019-12-10 DIAGNOSIS — C61 Malignant neoplasm of prostate: Secondary | ICD-10-CM | POA: Diagnosis not present

## 2019-12-10 DIAGNOSIS — R3915 Urgency of urination: Secondary | ICD-10-CM | POA: Diagnosis not present

## 2019-12-10 DIAGNOSIS — N200 Calculus of kidney: Secondary | ICD-10-CM | POA: Diagnosis not present

## 2019-12-20 ENCOUNTER — Other Ambulatory Visit: Payer: Self-pay | Admitting: Internal Medicine

## 2019-12-23 ENCOUNTER — Encounter: Payer: Self-pay | Admitting: Internal Medicine

## 2019-12-30 ENCOUNTER — Other Ambulatory Visit: Payer: Self-pay

## 2019-12-30 MED ORDER — AMLODIPINE-OLMESARTAN 10-40 MG PO TABS: 1.0000 | ORAL_TABLET | Freq: Every day | ORAL | 1 refills | Status: DC

## 2020-01-06 DIAGNOSIS — J343 Hypertrophy of nasal turbinates: Secondary | ICD-10-CM | POA: Diagnosis not present

## 2020-01-06 DIAGNOSIS — J310 Chronic rhinitis: Secondary | ICD-10-CM | POA: Diagnosis not present

## 2020-01-06 DIAGNOSIS — J342 Deviated nasal septum: Secondary | ICD-10-CM | POA: Diagnosis not present

## 2020-01-06 DIAGNOSIS — T485X5A Adverse effect of other anti-common-cold drugs, initial encounter: Secondary | ICD-10-CM | POA: Diagnosis not present

## 2020-01-11 ENCOUNTER — Ambulatory Visit: Payer: Medicare PPO | Attending: Internal Medicine

## 2020-01-11 DIAGNOSIS — Z23 Encounter for immunization: Secondary | ICD-10-CM

## 2020-01-11 NOTE — Progress Notes (Signed)
   Covid-19 Vaccination Clinic  Name:  Joel [name]    MRN: [id number] DOB: 06-15-1945  01/11/2020  [name] was observed post Covid-19 immunization for 15 minutes without incident. He was provided with Vaccine Information Sheet and instruction to access the V-Safe system.   [name] was instructed to call 911 with any severe reactions post vaccine: Marland Kitchen Difficulty breathing  . Swelling of face and throat  . A fast heartbeat  . A bad rash all over body  . Dizziness and weakness

## 2020-01-28 DIAGNOSIS — I10 Essential (primary) hypertension: Secondary | ICD-10-CM | POA: Diagnosis not present

## 2020-01-28 DIAGNOSIS — I480 Paroxysmal atrial fibrillation: Secondary | ICD-10-CM | POA: Diagnosis not present

## 2020-01-28 DIAGNOSIS — E039 Hypothyroidism, unspecified: Secondary | ICD-10-CM | POA: Diagnosis not present

## 2020-01-28 DIAGNOSIS — Z9889 Other specified postprocedural states: Secondary | ICD-10-CM | POA: Diagnosis not present

## 2020-01-29 DIAGNOSIS — I440 Atrioventricular block, first degree: Secondary | ICD-10-CM | POA: Diagnosis not present

## 2020-02-08 DIAGNOSIS — I480 Paroxysmal atrial fibrillation: Secondary | ICD-10-CM | POA: Diagnosis not present

## 2020-02-17 ENCOUNTER — Ambulatory Visit: Payer: Self-pay | Admitting: *Deleted

## 2020-02-17 DIAGNOSIS — C73 Malignant neoplasm of thyroid gland: Secondary | ICD-10-CM | POA: Diagnosis not present

## 2020-02-17 DIAGNOSIS — E890 Postprocedural hypothyroidism: Secondary | ICD-10-CM | POA: Diagnosis not present

## 2020-03-03 ENCOUNTER — Other Ambulatory Visit: Payer: Self-pay

## 2020-03-03 ENCOUNTER — Encounter: Payer: Self-pay | Admitting: Internal Medicine

## 2020-03-03 ENCOUNTER — Ambulatory Visit (INDEPENDENT_AMBULATORY_CARE_PROVIDER_SITE_OTHER): Payer: Medicare PPO

## 2020-03-03 DIAGNOSIS — Z23 Encounter for immunization: Secondary | ICD-10-CM

## 2020-03-03 NOTE — Progress Notes (Signed)
Pre visit review using our clinic review tool, if applicable. No additional management support is needed unless otherwise documented below in the visit note.  Patient here for flu vaccine. 0.5mL flu vaccine given in left deltoid IM. Patient tolerated well. VIS given.   

## 2020-03-17 ENCOUNTER — Other Ambulatory Visit: Payer: Self-pay | Admitting: Internal Medicine

## 2020-04-23 ENCOUNTER — Other Ambulatory Visit: Payer: Self-pay

## 2020-04-23 ENCOUNTER — Ambulatory Visit: Payer: Medicare PPO

## 2020-04-23 VITALS — BP 138/84 | HR 69 | Temp 98.3°F | Resp 16 | Ht 73.0 in | Wt 219.4 lb

## 2020-04-23 DIAGNOSIS — Z0000 Encounter for general adult medical examination without abnormal findings: Secondary | ICD-10-CM

## 2020-04-23 NOTE — Patient Instructions (Signed)
Joel [name] , Thank you for taking time to come for your Medicare Wellness Visit. I appreciate your ongoing commitment to your health goals. Please review the following plan we discussed and let me know if I can assist you in the future.   Screening recommendations/referrals: Colonoscopy: Completed 06/24/2015-Due 06/23/2020 Recommended yearly ophthalmology/optometry visit for glaucoma screening and checkup Recommended yearly dental visit for hygiene and checkup  Vaccinations: Influenza vaccine: Up to date Pneumococcal vaccine: Completed vaccines Tdap vaccine: Up to date-Due-07/24/2027 Shingles vaccine: Discuss with pharmacy Covid-19: Completed vaccines  Advanced directives: Information given today  Conditions/risks identified: See problem list  Next appointment: Follow up in one year for your annual wellness visit. 04/29/2021 @ 3:20  Preventive Care 73 Years and Older, Male Preventive care refers to lifestyle choices and visits with your health care provider that can promote health and wellness. What does preventive care include?  A yearly physical exam. This is also called an annual well check.  Dental exams once or twice a year.  Routine eye exams. Ask your health care provider how often you should have your eyes checked.  Personal lifestyle choices, including:  Daily care of your teeth and gums.  Regular physical activity.  Eating a healthy diet.  Avoiding tobacco and drug use.  Limiting alcohol use.  Practicing safe sex.  Taking low doses of aspirin every day.  Taking vitamin and mineral supplements as recommended by your health care provider. What happens during an annual well check? The services and screenings done by your health care provider during your annual well check will depend on your age, overall health, lifestyle risk factors, and family history of disease. Counseling  Your health care provider may ask you questions about your:  Alcohol  use.  Tobacco use.  Drug use.  Emotional well-being.  Home and relationship well-being.  Sexual activity.  Eating habits.  History of falls.  Memory and ability to understand (cognition).  Work and work Statistician. Screening  You may have the following tests or measurements:  Height, weight, and BMI.  Blood pressure.  Lipid and cholesterol levels. These may be checked every 5 years, or more frequently if you are over 42 years old.  Skin check.  Lung cancer screening. You may have this screening every year starting at age 73 if you have a 30-pack-year history of smoking and currently smoke or have quit within the past 15 years.  Fecal occult blood test (FOBT) of the stool. You may have this test every year starting at age 73.  Flexible sigmoidoscopy or colonoscopy. You may have a sigmoidoscopy every 5 years or a colonoscopy every 10 years starting at age 73.  Prostate cancer screening. Recommendations will vary depending on your family history and other risks.  Hepatitis C blood test.  Hepatitis B blood test.  Sexually transmitted disease (STD) testing.  Diabetes screening. This is done by checking your blood sugar (glucose) after you have not eaten for a while (fasting). You may have this done every 1-3 years.  Abdominal aortic aneurysm (AAA) screening. You may need this if you are a current or former smoker.  Osteoporosis. You may be screened starting at age 20 if you are at high risk. Talk with your health care provider about your test results, treatment options, and if necessary, the need for more tests. Vaccines  Your health care provider may recommend certain vaccines, such as:  Influenza vaccine. This is recommended every year.  Tetanus, diphtheria, and acellular pertussis (Tdap, Td) vaccine. You  may need a Td booster every 10 years.  Zoster vaccine. You may need this after age 40.  Pneumococcal 13-valent conjugate (PCV13) vaccine. One dose is  recommended after age 14.  Pneumococcal polysaccharide (PPSV23) vaccine. One dose is recommended after age 76. Talk to your health care provider about which screenings and vaccines you need and how often you need them. This information is not intended to replace advice given to you by your health care provider. Make sure you discuss any questions you have with your health care provider. Document Released: 03/27/2015 Document Revised: 11/18/2015 Document Reviewed: 12/30/2014 Elsevier Interactive Patient Education  2017 [place] Prevention in the Home Falls can cause injuries. They can happen to people of all ages. There are many things you can do to make your home safe and to help prevent falls. What can I do on the outside of my home?  Regularly fix the edges of walkways and driveways and fix any cracks.  Remove anything that might make you trip as you walk through a door, such as a raised step or threshold.  Trim any bushes or trees on the path to your home.  Use bright outdoor lighting.  Clear any walking paths of anything that might make someone trip, such as rocks or tools.  Regularly check to see if handrails are loose or broken. Make sure that both sides of any steps have handrails.  Any raised decks and porches should have guardrails on the edges.  Have any leaves, snow, or ice cleared regularly.  Use sand or salt on walking paths during winter.  Clean up any spills in your garage right away. This includes oil or grease spills. What can I do in the bathroom?  Use night lights.  Install grab bars by the toilet and in the tub and shower. Do not use towel bars as grab bars.  Use non-skid mats or decals in the tub or shower.  If you need to sit down in the shower, use a plastic, non-slip stool.  Keep the floor dry. Clean up any water that spills on the floor as soon as it happens.  Remove soap buildup in the tub or shower regularly.  Attach bath mats  securely with double-sided non-slip rug tape.  Do not have throw rugs and other things on the floor that can make you trip. What can I do in the bedroom?  Use night lights.  Make sure that you have a light by your bed that is easy to reach.  Do not use any sheets or blankets that are too big for your bed. They should not hang down onto the floor.  Have a firm chair that has side arms. You can use this for support while you get dressed.  Do not have throw rugs and other things on the floor that can make you trip. What can I do in the kitchen?  Clean up any spills right away.  Avoid walking on wet floors.  Keep items that you use a lot in easy-to-reach places.  If you need to reach something above you, use a strong step stool that has a grab bar.  Keep electrical cords out of the way.  Do not use floor polish or wax that makes floors slippery. If you must use wax, use non-skid floor wax.  Do not have throw rugs and other things on the floor that can make you trip. What can I do with my stairs?  Do not leave any items  on the stairs.  Make sure that there are handrails on both sides of the stairs and use them. Fix handrails that are broken or loose. Make sure that handrails are as long as the stairways.  Check any carpeting to make sure that it is firmly attached to the stairs. Fix any carpet that is loose or worn.  Avoid having throw rugs at the top or bottom of the stairs. If you do have throw rugs, attach them to the floor with carpet tape.  Make sure that you have a light switch at the top of the stairs and the bottom of the stairs. If you do not have them, ask someone to add them for you. What else can I do to help prevent falls?  Wear shoes that:  Do not have high heels.  Have rubber bottoms.  Are comfortable and fit you well.  Are closed at the toe. Do not wear sandals.  If you use a stepladder:  Make sure that it is fully opened. Do not climb a closed  stepladder.  Make sure that both sides of the stepladder are locked into place.  Ask someone to hold it for you, if possible.  Clearly mark and make sure that you can see:  Any grab bars or handrails.  First and last steps.  Where the edge of each step is.  Use tools that help you move around (mobility aids) if they are needed. These include:  Canes.  Walkers.  Scooters.  Crutches.  Turn on the lights when you go into a dark area. Replace any light bulbs as soon as they burn out.  Set up your furniture so you have a clear path. Avoid moving your furniture around.  If any of your floors are uneven, fix them.  If there are any pets around you, be aware of where they are.  Review your medicines with your doctor. Some medicines can make you feel dizzy. This can increase your chance of falling. Ask your doctor what other things that you can do to help prevent falls. This information is not intended to replace advice given to you by your health care provider. Make sure you discuss any questions you have with your health care provider. Document Released: 12/25/2008 Document Revised: 08/06/2015 Document Reviewed: 04/04/2014 Elsevier Interactive Patient Education  2017 [employer].

## 2020-04-23 NOTE — Progress Notes (Signed)
Subjective:   RONDARIUS KADRMAS is a 75 y.o. male who presents for Medicare Annual/Subsequent preventive examination.   Review of Systems     Cardiac Risk Factors include: advanced age (>69men, >82 women);hypertension     Objective:    Today's Vitals   04/23/20 1518  BP: 138/84  Pulse: 69  Resp: 16  Temp: 98.3 F (36.8 C)  TempSrc: Oral  SpO2: 98%  Weight: 219 lb 6.4 oz (99.5 kg)  Height: 6\' 1"  (1.854 m)   Body mass index is 28.95 kg/m.  Advanced Directives 04/23/2020 02/14/2019 05/10/2018 02/01/2018 07/21/2017 07/11/2016 05/28/2011  Does Patient Have a Medical Advance Directive? No No No No No No Patient does not have advance directive  Does patient want to make changes to medical advance directive? - - - - - Yes (MAU/Ambulatory/Procedural Areas - Information given) -  Would patient like information on creating a medical advance directive? Yes (MAU/Ambulatory/Procedural Areas - Information given) No - Patient declined No - Patient declined No - Patient declined Yes (MAU/Ambulatory/Procedural Areas - Information given) - -  Pre-existing out of facility DNR order (yellow form or pink MOST form) - - - - - - No    Current Medications (verified) Outpatient Encounter Medications as of 04/23/2020  Medication Sig  . amLODipine-olmesartan (AZOR) 10-40 MG tablet Take 1 tablet by mouth daily.  . Bromfenac Sodium 0.09 % SOLN Apply 1 drop to eye daily. Left eye only  . levETIRAcetam (KEPPRA XR) 500 MG 24 hr tablet Take 3 tablets (1,500 mg total) by mouth at bedtime.  Marland Kitchen levothyroxine (SYNTHROID) 137 MCG tablet Take 137 mcg by mouth daily.  . metoprolol tartrate (LOPRESSOR) 25 MG tablet Take 12.5 mg by mouth 2 (two) times daily.  . [DISCONTINUED] leuprolide (LUPRON DEPOT, 66-MONTH,) 11.25 MG injection Inject into the muscle. As directed  . [DISCONTINUED] levothyroxine (SYNTHROID) 150 MCG tablet Take 150 mcg by mouth daily before breakfast.  . [DISCONTINUED] tamsulosin (FLOMAX) 0.4 MG CAPS  capsule Take 0.4 mg by mouth daily.   No facility-administered encounter medications on file as of 04/23/2020.    Allergies (verified) Patient has no known allergies.   History: Past Medical History:  Diagnosis Date  . Anemia SEVERAL YRS AGO   Dr. Paulita Fujita  . 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 Fall 05-2011    had a IC bleed   . Hearing loss    Rt ear sensorineural hearing loss, . W/u by Dr. Remer Macho extensively negative  . HTN (hypertension)   . Prostate cancer ([place]) 2019  . Seizure disorder ([place])    Dx remotely, last SZ activity aprox 1998  . 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 Thyroid cancer ([place]) 2020   Past Surgical History:  Procedure Laterality Date  . CATARACT EXTRACTION Bilateral    R 2014, L 03-2013  . CRANIOTOMY  05/27/2011   Procedure: CRANIOTOMY HEMATOMA EVACUATION SUBDURAL;  Surgeon: Hosie Spangle, MD;  Location: [facility];  Service: Neurosurgery;  Laterality: Right;  Evacuation of Subdural Hematoma  . CYSTOSCOPY N/A 02/01/2018   Procedure: CYSTOSCOPY;  Surgeon: Alexis Frock, MD;  Location: [facility];  Service: Urology;  Laterality: N/A;  no seeds in bladder per Dr Tresa Moore  . EYE SURGERY  11-2011    for retinal detachment , L, Dr Zadie Rhine  . HERNIA REPAIR     as an infant  . NECK DISSECTION  12/14/2018  . PROSTATE BIOPSY  2019  . RADIOACTIVE  SEED IMPLANT N/A 02/01/2018   Procedure: RADIOACTIVE SEED IMPLANT/BRACHYTHERAPY IMPLANT;  Surgeon: Alexis Frock, MD;  Location: [facility];  Service: Urology;  Laterality: N/A;  58 seeds implanted  . SPACE OAR INSTILLATION N/A 02/01/2018   Procedure: SPACE OAR INSTILLATION;  Surgeon: Alexis Frock, MD;  Location: [facility];  Service: Urology;  Laterality: N/A;  . STERNOTOMY  12/14/2018  . THYROIDECTOMY  12/14/2018  . WISDOM TOOTH EXTRACTION     x2, Upper   Family History  Problem Relation Age of Onset  . Emphysema Mother        smoker  .  Hypertension Father        F and B   . Leukemia Sister   . Prostate cancer Brother 29  . Diabetes Neg Hx   . Colon cancer Neg Hx   . Coronary artery disease Neg Hx    Social History   Socioeconomic History  . Marital status: Single    Spouse name: Not on file  . Number of children: 2  . Years of education: PHD  . Highest education level: Not on file  Occupational History  . Occupation: Retired: Prof [employer]  Tobacco Use  . Smoking status: Never Smoker  . Smokeless tobacco: Never Used  Vaping Use  . Vaping Use: Never used  Substance and Sexual Activity  . Alcohol use: No    Alcohol/week: 0.0 standard drinks    Comment: not in  years  . Drug use: No  . Sexual activity: Not Currently  Other Topics Concern  . Not on file  Social History Narrative   2 children, one in [place] w/ pt   Patient is retired.   Education PHD    Right handed.   Caffeine Three cups of coffee daily.   Social Determinants of Health   Financial Resource Strain: Low Risk   . Difficulty of Paying Living Expenses: Not hard at all  Food Insecurity: No Food Insecurity  . Worried About Charity fundraiser in the Last Year: Never true  . Ran Out of Food in the Last Year: Never true  Transportation Needs: No Transportation Needs  . Lack of Transportation (Medical): No  . Lack of Transportation (Non-Medical): No  Physical Activity: Sufficiently Active  . Days of Exercise per Week: 7 days  . Minutes of Exercise per Session: 120 min  Stress: No Stress Concern Present  . Feeling of Stress : Not at all  Social Connections: Socially Isolated  . Frequency of Communication with Friends and Family: More than three times a week  . Frequency of Social Gatherings with Friends and Family: More than three times a week  . Attends Religious Services: Never  . Active Member of Clubs or Organizations: No  . Attends Archivist Meetings: Never  . Marital Status: Never married    Tobacco  Counseling Counseling given: Not Answered   Clinical Intake:  Pre-visit preparation completed: Yes  Pain : No/denies pain     Nutritional Status: BMI 25 -29 Overweight Nutritional Risks: None Diabetes: No  How often do you need to have someone help you when you read instructions, pamphlets, or other written materials from your doctor or pharmacy?: 1 - Never  Diabetic?No  Interpreter Needed?: No  Information entered by :: Caroleen Hamman LPN   Activities of Daily Living In your present state of health, do you have any difficulty performing the following activities: 04/23/2020  Hearing? N  Vision? N  Difficulty concentrating or making decisions? N  Walking or climbing stairs? N  Dressing or bathing? N  Doing errands, shopping? N  Preparing Food and eating ? N  Using the Toilet? N  In the past six months, have you accidently leaked urine? N  Do you have problems with loss of bowel control? N  Managing your Medications? N  Managing your Finances? N  Housekeeping or managing your Housekeeping? N  Some recent data might be hidden    Patient Care Team: Colon Branch, MD as PCP - General Sharyne Peach, MD as Consulting Physician (Ophthalmology) Zadie Rhine Clent Demark, MD as Consulting Physician (Ophthalmology) Marcial Pacas, MD as Consulting Physician (Neurology) Arta Silence, MD as Consulting Physician (Gastroenterology) Alexis Frock, MD as Consulting Physician (Urology) Jerrell [place], MD as Consulting Physician (Otolaryngology) Francina Ames, MD as Referring Physician (Otolaryngology) Moss Mc, MD as Referring Physician (Cardiology) Kathlene Cote, MD as Referring Physician (Endocrinology)  Indicate any recent Medical Services you may have received from other than [facility] providers in the past year (date may be approximate).     Assessment:   This is a routine wellness examination for [place].  Hearing/Vision screen  Hearing Screening   125Hz   250Hz  500Hz  1000Hz  2000Hz  3000Hz  4000Hz  6000Hz  8000Hz   Right ear:           Left ear:           Comments: No issues  Vision Screening Comments: Last eye exam-11/2019-Dr. [place]  Dietary issues and exercise activities discussed: Current Exercise Habits: Home exercise routine, Type of exercise: strength training/weights (cardio), Time (Minutes): 60 (pt states he exercises several hours per day most of the time), Frequency (Times/Week): 7, Weekly Exercise (Minutes/Week): 420, Intensity: Moderate, Exercise limited by: None identified  Goals    . Patient Stated     Maintain current health & level of activity      Depression Screen PHQ 2/9 Scores 04/23/2020 02/14/2019 01/24/2019 09/28/2017 07/21/2017 07/11/2016 07/08/2015  PHQ - 2 Score 0 0 0 0 0 0 0    Fall Risk Fall Risk  04/23/2020 02/14/2019 09/25/2018 09/28/2017 07/21/2017  Falls in the past year? 0 1 0 No No  Number falls in past yr: 0 - 0 - -  Injury with Fall? 0 - - - -  Follow up Falls prevention discussed Education provided;Falls prevention discussed - - -    FALL RISK PREVENTION PERTAINING TO THE HOME:  Any stairs in or around the home? No  Home free of loose throw rugs in walkways, pet beds, electrical cords, etc? Yes  Adequate lighting in your home to reduce risk of falls? Yes   ASSISTIVE DEVICES UTILIZED TO PREVENT FALLS:  Life alert? No  Use of a cane, walker or w/c? No  Grab bars in the bathroom? Yes  Shower chair or bench in shower? No  Elevated toilet seat or a handicapped toilet? No   TIMED UP AND GO:  Was the test performed? Yes .  Length of time to ambulate 10 feet: 9 sec.   Gait steady and fast without use of assistive device  Cognitive Function:Normal cognitive status assessed by direct observation by this Nurse Health Advisor. No abnormalities found.   MMSE - Mini Mental State Exam 07/11/2016  Orientation to time 5  Orientation to Place 5  Registration 3  Attention/ Calculation 5  Recall 3  Language- name  2 objects 2  Language- repeat 1  Language- follow 3 step command 3  Language- read &  follow direction 1  Write a sentence 1  Copy design 1  Total score 30        Immunizations Immunization History  Administered Date(s) Administered  . Fluad Quad(high Dose 65+) 11/22/2018, 03/03/2020  . Influenza Whole 02/12/2007, 01/09/2008, 12/24/2008, 12/17/2009, 01/20/2013  . Influenza, High Dose Seasonal PF 01/12/2018  . Influenza-Unspecified 10/31/2015, 01/10/2017  . PFIZER(Purple Top)SARS-COV-2 Vaccination 05/05/2019, 05/29/2019, 01/11/2020  . Pneumococcal Conjugate-13 05/30/2014, 10/31/2015  . Pneumococcal Polysaccharide-23 04/03/2012  . Td 05/16/2007  . Tdap 07/23/2017  . Zoster 05/04/2012    TDAP status: Up to date  Flu Vaccine status: Up to date  Pneumococcal vaccine status: Up to date  Covid-19 vaccine status: Completed vaccines  Qualifies for Shingles Vaccine? Yes   Zostavax completed Yes   Shingrix Completed?: No.    Education has been provided regarding the importance of this vaccine. Patient has been advised to call insurance company to determine out of pocket expense if they have not yet received this vaccine. Advised may also receive vaccine at local pharmacy or Health Dept. Verbalized acceptance and understanding.  Screening Tests Health Maintenance  Topic Date Due  . COLONOSCOPY (Pts 45-82yrs Insurance coverage will need to be confirmed)  06/23/2020  . COVID-19 Vaccine (4 - Booster for Pfizer series) 07/11/2020  . TETANUS/TDAP  07/24/2027  . INFLUENZA VACCINE  Completed  . Hepatitis C Screening  Completed  . PNA vac Low Risk Adult  Completed    Health Maintenance  There are no preventive care reminders to display for this patient.  Colorectal cancer screening: Type of screening: Colonoscopy. Completed 06/24/2015. Repeat every 5 years  Lung Cancer Screening: (Low Dose CT Chest recommended if Age 79-80 years, 30 pack-year currently smoking OR have quit w/in  15years.) does not qualify.     Additional Screening:  Hepatitis C Screening:Completed 07/11/2016  Vision Screening: Recommended annual ophthalmology exams for early detection of glaucoma and other disorders of the eye. Is the patient up to date with their annual eye exam?  Yes  Who is the provider or what is the name of the office in which the patient attends annual eye exams? Dr. Zadie Rhine   Dental Screening: Recommended annual dental exams for proper oral hygiene  Community Resource Referral / Chronic Care Management: CRR required this visit?  No   CCM required this visit?  No      Plan:     I have personally reviewed and noted the following in the patient's chart:   . Medical and social history . Use of alcohol, tobacco or illicit drugs  . Current medications and supplements . Functional ability and status . Nutritional status . Physical activity . Advanced directives . List of other physicians . Hospitalizations, surgeries, and ER visits in previous 12 months . Vitals . Screenings to include cognitive, depression, and falls . Referrals and appointments  In addition, I have reviewed and discussed with patient certain preventive protocols, quality metrics, and best practice recommendations. A written personalized care plan for preventive services as well as general preventive health recommendations were provided to patient.     Patient would like to access avs on my-chart.   Marta Antu, LPN   [id number]  Nurse Health Advisor  Nurse Notes: None

## 2020-05-18 DIAGNOSIS — C61 Malignant neoplasm of prostate: Secondary | ICD-10-CM | POA: Diagnosis not present

## 2020-05-18 LAB — PSA: PSA: <0.015

## 2020-05-25 DIAGNOSIS — N200 Calculus of kidney: Secondary | ICD-10-CM | POA: Diagnosis not present

## 2020-05-25 DIAGNOSIS — R3915 Urgency of urination: Secondary | ICD-10-CM | POA: Diagnosis not present

## 2020-05-25 DIAGNOSIS — C61 Malignant neoplasm of prostate: Secondary | ICD-10-CM | POA: Diagnosis not present

## 2020-05-26 ENCOUNTER — Encounter: Payer: Self-pay | Admitting: Internal Medicine

## 2020-06-17 DIAGNOSIS — Z23 Encounter for immunization: Secondary | ICD-10-CM | POA: Diagnosis not present

## 2020-06-22 ENCOUNTER — Encounter: Payer: Self-pay | Admitting: Internal Medicine

## 2020-07-16 ENCOUNTER — Encounter: Payer: Self-pay | Admitting: Neurology

## 2020-07-16 ENCOUNTER — Ambulatory Visit: Payer: Medicare PPO | Admitting: Neurology

## 2020-07-16 VITALS — BP 150/74 | HR 70 | Ht 73.0 in | Wt 226.0 lb

## 2020-07-16 DIAGNOSIS — G40909 Epilepsy, unspecified, not intractable, without status epilepticus: Secondary | ICD-10-CM | POA: Diagnosis not present

## 2020-07-16 MED ORDER — LEVETIRACETAM ER 500 MG PO TB24: 1500.0000 mg | ORAL_TABLET | Freq: Every day | ORAL | 4 refills | Status: DC

## 2020-07-16 NOTE — Patient Instructions (Signed)
Continue current medications  Call for any seizures  See you back in 1 year

## 2020-07-16 NOTE — Progress Notes (Signed)
PATIENT: Joel [name] DOB: 06/12/1945  REASON FOR VISIT: follow up HISTORY FROM: patient  HISTORY OF PRESENT ILLNESS: Today 07/16/20  HISTORY HISTORYRobert D Cruzen isa 75 years old right handed African American male with a history of seizures beginning since March of 1993. Previously patients of Dr. of Dr. Sandria Manly, last clinical visit was February 2014. He would get up in the middle of the night to go the bathroom and awakened the next morning in bed with his tongue biten. Evaluation in 1993 with sleep deprived EEG and CAT scan of the brain without and with contrast were normal. 2-D echocardiogram and Holter monitoring evaluation and treadmill were normal. He had a similar episode of blacking out in the middle of the night in May of 1993. In May of 1994 he was admitted to [facility]. At that time he had an episode during the daytime and had loss of consciousness with jerking of his arms and legs and bit his tongue without urinary incontinence. EEG was normal and he was placed on Dilantin since 1994. He developed transient elevated liver function tests with GGT 232. EEG 08/22/95 was normal. MRI study of the brain 05/21/96 showed a small left arachnoid cyst in the anterior temporal fossa, and questionable atrohy in the left hippocampus.  He had a seizure at his wife's funeral in March of 1998. He denies macropsia, micropsia, dj vu, strange odors or tastes.   Phenytoin level 06/23/09 was 9.0. DEXA scan 06/30/2009 revealed normal T. Scores. He goes to a fitness center 7 days per week doing cardio, strength training, and "spins". He can balance on the balls of his feet for a long time.  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 He is independent in activities of daily living. He denies macropsia,micropsia, strange odors or tastes. He had sudden right ear hearing loss in 07/2010 and underwent MRI study 07/2010 and 08/31/2010 which  was unremarkable except for small arachnoid cyst in the anterior aspect of the left middle fossa .He underwent a course of prednisone without benefit. In mid December 2012 he got up in the middle of the night ,was on his toilet to void, stood up and next remembers awaking on the floor. He went back to bed and the next morning noted blood on his pillow from a posterior head injury. CT scan showed a small left frontal bleed, thought to represent a contrecoup injury. He was admitted to [facility] 02/25/11 and seen in consultation by Dr. Marikay Alar, neurosurgeon He was followed as an outpatient. After repeat CT scan showed improvement he was allowed to return to the fitness center.   Mid February 2013 he had problems walking without headache. He fell striking his head 05/12/2011 and developed worsening gait with a festinating quality. 05/19/2011, he fell in the driveway. 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 He underwent craniotomy 3/15 by Dr. Shirlean Kelly. He was discharged 06/02/2011.  After discharge he noted intermittent tingling in his left hand,face, and arm, lasting 30 seconds, 4 times per day.He had slurred speech with the episodes. CT scan 06/06/11 showed bilateral subdurals right greater than left of 10 mm with 5 mm right to left midline shift. There was no evidence of new hemorrhage.   06/09/2011, he had a witnessed generalized major motor seizure and was seen in the [facility] with a phenytoin level of approximate 8.Keppra 750 mg  twice a day was added on since then. In combination with his Dilantin 100 mg 2 tablets twice a day. Repeat CT scan 5/13 showed SDHs had decreased and a density in the right frontal region of unknown etiology .CT scan 09/12/2011 showed resolution of tiny left SDH, interval decrease in the small, improving white matter edema in the right frontal lobe, and a small infarct in the left frontal lobe was slightly more prominent.He is  exercising daily. No seizures. He underwent scleral buckling OS for retinal detachment by Dr. Zadie Rhine and has left eye cataract to be followed by Dr. Katy Fitch. Bone density 01/12/2012 was normal  UPDATE April 30th 2015:YYHe had a right eye cataract surgery in 2014, recovering very well,he has no recurrent seizures,he is now taking Keppra xr 750mg  bid, and Dilantin 100 mg 2 tablets twice a day. He wants to consider tapering off Dilantin. He still exercise daily, 2-3 hours daily.  UPDATE May 2nd 2016:YYHe continue to exercise, has taper of Dilantin now, taking Keppra xr 500 mg 2 tablets twice a day, no significant side effect  UPDATE 05/02/2017CMMr. Staver returns for yearly follow-up for his seizure disorder. He has previously been followed by Dr.Yan and Dr. Erling Cruz. Records reviewed Now taking Keppra XR 750 mg twice daily without side effects. Last seizure activity March 2013. He continues to exercise twice a day. No new neurologic complaints he returns for reevaluation UPDATE 04/26/2018CMMr.Hansen, 75 year old male returns for follow-up with history of seizure disorder. He also has a history of subdural hematoma in 2013 with evacuation. His last seizure event was in March 2013. He eats healthy He continues to exercise twice a day. He is currently on Keppra extended release 500 mg 3 tablets at bedtime. He denies any side effects to the medication. He has had no interval medical problems since last seen. He returns for reevaluation UPDATE4/29/2019CMMr. Bleicher, 75 year old male returns for follow-up with history of seizure disorder. He had a subdural hematoma in 2013 evacuation. His last seizure occurred in March 2013. He continues to exercise twice a day and eat healthy. He is sleeping well at night. He is currently on Keppra extended release without side effects. He is due to have a prostate biopsy for elevated PSA soon. No other interval medical issues.He returns for  reevaluation UPDATE 07/16/2018 SS: Mr. Melroy 75 year old male with history of seizure disorder. His last seizure was in March 2013. He has not had any recurrent seizures. He continues taking Keppra XR 500 mg, 3 tablets at bedtime. Tolerating the medication well. He has finished radiation for prostate cancer. He is retired, enjoys being active, working out, he has written 3 books. He drives a car without difficulty, his son lives with him. He needs a refill on his Keppra. He is getting a hormone injection, Lupron for the next year.   Update Jul 16, 2019 SS: Mr. Brandl is a 75 year old male with history of seizure disorder.  His last seizure was in March 2013.  He remains on Keppra XR 500 mg, 3 tablets at bedtime. He has since had his thyroid removed since last time back in October, he got his last Lupron shot recently for prostate cancer, will be having PSA rechecked soon. Had AFIB, post op thyroid, AFIB was transient. He is still active, is finishing his 4th look, about love poems.  He drives a car without difficulty, his son lives with him.  He needs a refill on Keppra.  Update Jul 16, 2020 SS: Here today alone, no seizures, remains  on Keppra XR 500 mg, 3 tablets at bedtime. Life is going well. Prostate cancer is undetectable. Had AFIB with thyroidectomy, has seen cardiology, wore heart monitor, there was no further AFIB. 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   REVIEW OF SYSTEMS: Out of a complete 14 system review of symptoms, the patient complains only of the following symptoms, and all other reviewed systems are negative.  N/A  ALLERGIES: No Known Allergies  HOME MEDICATIONS: Outpatient Medications Prior to Visit  Medication Sig Dispense Refill  . amLODipine-olmesartan (AZOR) 10-40 MG tablet Take 1 tablet by mouth daily. 90 tablet 1  . Bromfenac Sodium 0.09 % SOLN Apply 1 drop to eye daily. Left eye only 18 mL 0  . levothyroxine (SYNTHROID) 137 MCG tablet Take 137 mcg by mouth daily.     . metoprolol tartrate (LOPRESSOR) 25 MG tablet Take 12.5 mg by mouth 2 (two) times daily.    Marland Kitchen levETIRAcetam (KEPPRA XR) 500 MG 24 hr tablet Take 3 tablets (1,500 mg total) by mouth at bedtime. 270 tablet 3   No facility-administered medications prior to visit.    PAST MEDICAL HISTORY: Past Medical History:  Diagnosis Date  . Anemia SEVERAL YRS AGO   Dr. Paulita Fujita  . 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 Fall 05-2011    had a IC bleed   . Hearing loss    Rt ear sensorineural hearing loss, . W/u by Dr. Remer Macho extensively negative  . HTN (hypertension)   . Prostate cancer ([place]) 2019  . Seizure disorder ([place])    Dx remotely, last SZ activity aprox 1998  . Subdural hematoma ([place]) 2013  . Thyroid cancer ([place]) 2020    PAST SURGICAL HISTORY: Past Surgical History:  Procedure Laterality Date  . CATARACT EXTRACTION Bilateral    R 2014, L 03-2013  . CRANIOTOMY  05/27/2011   Procedure: CRANIOTOMY HEMATOMA EVACUATION SUBDURAL;  Surgeon: Hosie Spangle, MD;  Location: [facility];  Service: Neurosurgery;  Laterality: Right;  Evacuation of Subdural Hematoma  . CYSTOSCOPY N/A 02/01/2018   Procedure: CYSTOSCOPY;  Surgeon: Alexis Frock, MD;  [facility];  Service: Urology;  Laterality: N/A;  no seeds in bladder per Dr Tresa Moore  . EYE SURGERY  11-2011    for retinal detachment , L, Dr Zadie Rhine  . HERNIA REPAIR     as an infant  . NECK DISSECTION  12/14/2018  . PROSTATE BIOPSY  2019  . RADIOACTIVE SEED IMPLANT N/A 02/01/2018   Procedure: RADIOACTIVE SEED IMPLANT/BRACHYTHERAPY IMPLANT;  Surgeon: Alexis Frock, MD;  Location[facility];  Service: Urology;  Laterality: N/A;  58 seeds implanted  . SPACE OAR INSTILLATION N/A 02/01/2018   Procedure: SPACE OAR INSTILLATION;  Surgeon: Alexis Frock, MD;  Location: [facility];  Service: Urology;  Laterality: N/A;  . STERNOTOMY  12/14/2018  . THYROIDECTOMY  12/14/2018  .  WISDOM TOOTH EXTRACTION     x2, Upper    FAMILY HISTORY: Family History  Problem Relation Age of Onset  . Emphysema Mother        smoker  . Hypertension Father        F and B   . Leukemia Sister   . Prostate cancer Brother 70  . Diabetes Neg Hx   . Colon cancer Neg Hx   . Coronary artery disease Neg Hx     SOCIAL HISTORY: Social History   Socioeconomic History  . Marital  status: Single    Spouse name: Not on file  . Number of children: 2  . Years of education: PHD  . Highest education level: Not on file  Occupational History  . Occupation: Retired: Prof [employer]  Tobacco Use  . Smoking status: Never Smoker  . Smokeless tobacco: Never Used  Vaping Use  . Vaping Use: Never used  Substance and Sexual Activity  . Alcohol use: No    Alcohol/week: 0.0 standard drinks    Comment: not in  years  . Drug use: No  . Sexual activity: Not Currently  Other Topics Concern  . Not on file  Social History Narrative   2 children, one in [place] w/ pt   Patient is retired.   Education PHD    Right handed.   Caffeine Three cups of coffee daily.   Social Determinants of Health   Financial Resource Strain: Low Risk   . Difficulty of Paying Living Expenses: Not hard at all  Food Insecurity: No Food Insecurity  . Worried About Charity fundraiser in the Last Year: Never true  . Ran Out of Food in the Last Year: Never true  Transportation Needs: No Transportation Needs  . Lack of Transportation (Medical): No  . Lack of Transportation (Non-Medical): No  Physical Activity: Sufficiently Active  . Days of Exercise per Week: 7 days  . Minutes of Exercise per Session: 120 min  Stress: No Stress Concern Present  . Feeling of Stress : Not at all  Social Connections: Socially Isolated  . Frequency of Communication with Friends and Family: More than three times a week  . Frequency of Social Gatherings with Friends and Family: More than three times a week  . Attends  Religious Services: Never  . Active Member of Clubs or Organizations: No  . Attends Archivist Meetings: Never  . Marital Status: Never married  Intimate Partner Violence: Not At Risk  . Fear of Current or Ex-Partner: No  . Emotionally Abused: No  . Physically Abused: No  . Sexually Abused: No   PHYSICAL EXAM  Vitals:   07/16/20 1504  BP: (!) 150/74  Pulse: 70  Weight: 226 lb (102.5 kg)  Height: 6\' 1"  (1.854 m)   Body mass index is 29.82 kg/m.  Generalized: Well developed, in no acute distress   Neurological examination  Mentation: Alert oriented to time, place, history taking. Follows all commands speech and language fluent Cranial nerve II-XII: Pupils were equal round reactive to light. Extraocular movements were full, visual field were full on confrontational test. Facial sensation and strength were normal.  Head turning and shoulder shrug  were normal and symmetric. Motor: The motor testing reveals 5 over 5 strength of all 4 extremities. Good symmetric motor tone is noted throughout.  Sensory: Sensory testing is intact to soft touch on all 4 extremities. No evidence of extinction is noted.  Coordination: Cerebellar testing reveals good finger-nose-finger and heel-to-shin bilaterally.  Gait and station: Gait is normal.  Reflexes: Deep tendon reflexes are symmetric and normal bilaterally.   DIAGNOSTIC DATA (LABS, IMAGING, TESTING) - I reviewed patient records, labs, notes, testing and imaging myself where available.  Lab Results  Component Value Date   WBC 6.3 09/27/2019   HGB 12.1 (L) 09/27/2019   HCT 36.8 (L) 09/27/2019   MCV 93.9 09/27/2019   PLT 180.0 09/27/2019      Component Value Date/Time   NA 140 09/27/2019 1346  K 4.2 09/27/2019 1346   CL 106 09/27/2019 1346   CO2 27 09/27/2019 1346   GLUCOSE 108 (H) 09/27/2019 1346   BUN 16 09/27/2019 1346   CREATININE 0.90 09/27/2019 1346   CREATININE 0.96 07/21/2017 1537   CALCIUM 10.1 09/27/2019 1346    PROT 7.5 09/27/2019 1346   ALBUMIN 4.4 09/27/2019 1346   AST 22 09/27/2019 1346   ALT 17 09/27/2019 1346   ALKPHOS 104 09/27/2019 1346   BILITOT 0.8 09/27/2019 1346   GFRNONAA >60 01/25/2018 1106   GFRAA >60 01/25/2018 1106   Lab Results  Component Value Date   CHOL 187 09/27/2019   HDL 46.70 09/27/2019   LDLCALC 119 (H) 09/27/2019   LDLDIRECT 109.4 04/05/2012   TRIG 105.0 09/27/2019   CHOLHDL 4 09/27/2019   No results found for: HGBA1C No results found for: VITAMINB12 Lab Results  Component Value Date   TSH 1.12 09/25/2018    ASSESSMENT AND PLAN 75 y.o. year old male  has a past medical history of Anemia (SEVERAL YRS AGO), Back pain, Fall (05-2011 ), Hearing loss, HTN (hypertension), Prostate cancer ([place]) (2019), Seizure disorder ([place]), Subdural hematoma ([place]) (2013), and Thyroid cancer ([place]) (2020). here with:  1.  Seizure disorder -Last seizure was in March 2013, continues to do quite well -Continue Keppra XR 500 mg, 3 tablets at bedtime, refill sent in  -Call for recurrent seizure, otherwise follow-up 1 year  Butler Denmark, [place], DNP 07/16/2020, 3:24 PM [facility] [address], [place], [place] [address] [phone]

## 2020-09-28 ENCOUNTER — Ambulatory Visit (INDEPENDENT_AMBULATORY_CARE_PROVIDER_SITE_OTHER): Payer: Medicare PPO | Admitting: Internal Medicine

## 2020-09-28 ENCOUNTER — Other Ambulatory Visit: Payer: Self-pay

## 2020-09-28 VITALS — BP 125/78 | HR 66 | Temp 98.3°F | Resp 18 | Ht 73.0 in | Wt 222.4 lb

## 2020-09-28 DIAGNOSIS — Z0000 Encounter for general adult medical examination without abnormal findings: Secondary | ICD-10-CM | POA: Diagnosis not present

## 2020-09-28 DIAGNOSIS — E785 Hyperlipidemia, unspecified: Secondary | ICD-10-CM | POA: Diagnosis not present

## 2020-09-28 LAB — COMPREHENSIVE METABOLIC PANEL
ALT: 15 U/L (ref 0–53)
AST: 21 U/L (ref 0–37)
Albumin: 4.4 g/dL (ref 3.5–5.2)
Alkaline Phosphatase: 96 U/L (ref 39–117)
BUN: 12 mg/dL (ref 6–23)
CO2: 26 mEq/L (ref 19–32)
Calcium: 9.9 mg/dL (ref 8.4–10.5)
Chloride: 107 mEq/L (ref 96–112)
Creatinine, Ser: 1.12 mg/dL (ref 0.40–1.50)
GFR: 65.16 mL/min (ref >60.00)
Glucose, Bld: 97 mg/dL (ref 70–99)
Potassium: 4.7 mEq/L (ref 3.5–5.1)
Sodium: 142 mEq/L (ref 135–145)
Total Bilirubin: 1.1 mg/dL (ref 0.2–1.2)
Total Protein: 7.3 g/dL (ref 6.0–8.3)

## 2020-09-28 LAB — CBC WITH DIFFERENTIAL/PLATELET
Basophils Absolute: 0 10*3/uL (ref 0.0–0.1)
Basophils Relative: 0.6 % (ref 0.0–3.0)
Eosinophils Absolute: 0.1 10*3/uL (ref 0.0–0.7)
Eosinophils Relative: 1.3 % (ref 0.0–5.0)
HCT: 42.3 % (ref 39.0–52.0)
Hemoglobin: 14.1 g/dL (ref 13.0–17.0)
Lymphocytes Relative: 15.9 % (ref 12.0–46.0)
Lymphs Abs: 1.1 10*3/uL (ref 0.7–4.0)
MCHC: 33.2 g/dL (ref 30.0–36.0)
MCV: 95.7 fl (ref 78.0–100.0)
Monocytes Absolute: 0.6 10*3/uL (ref 0.1–1.0)
Monocytes Relative: 9 % (ref 3.0–12.0)
Neutro Abs: 5.2 10*3/uL (ref 1.4–7.7)
Neutrophils Relative %: 73.2 % (ref 43.0–77.0)
Platelets: 169 10*3/uL (ref 150.0–400.0)
RBC: 4.42 Mil/uL (ref 4.22–5.81)
RDW: 13.1 % (ref 11.5–15.5)
WBC: 7.1 10*3/uL (ref 4.0–10.5)

## 2020-09-28 LAB — LIPID PANEL
Cholesterol: 188 mg/dL (ref 0–200)
HDL: 42.7 mg/dL (ref >39.00)
LDL Cholesterol: 125 mg/dL — ABNORMAL HIGH (ref 0–99)
NonHDL: 145.49
Total CHOL/HDL Ratio: 4
Triglycerides: 103 mg/dL (ref 0.0–149.0)
VLDL: 20.6 mg/dL (ref 0.0–40.0)

## 2020-09-28 NOTE — Progress Notes (Signed)
Subjective:    Patient ID: Joel [name], male    DOB: 06/13/1945, 75 y.o.   MRN: [id number]  DOS:  09/28/2020 Type of visit - description: CPX  Since the last office visit, he is doing well. Still having issues with the ED. Would like to see a dermatologist.      Review of Systems  Other than above, a 14 point review of systems is negative       Past Medical History:  Diagnosis Date   Anemia SEVERAL YRS AGO   Dr. Paulita Fujita   Back pain    HNP dx ~2008, no surgery RESOLVED NOW   Fall 05-2011    had a IC bleed    Hearing loss    Rt ear sensorineural hearing loss, . W/u by Dr. Remer Macho extensively negative   HTN (hypertension)    Prostate cancer ([place]) 2019   Seizure disorder [facility])    Dx remotely, last SZ activity aprox 1998   Subdural hematoma ([place]) 2013   Thyroid cancer ([place]) 2020    Past Surgical History:  Procedure Laterality Date   CATARACT EXTRACTION Bilateral    R 2014, L 03-2013   CRANIOTOMY  05/27/2011   Procedure: CRANIOTOMY HEMATOMA EVACUATION SUBDURAL;  Surgeon: Hosie Spangle, MD;  Location: [facility];  Service: Neurosurgery;  Laterality: Right;  Evacuation of Subdural Hematoma   CYSTOSCOPY N/A 02/01/2018   Procedure: CYSTOSCOPY;  Surgeon: Alexis Frock, MD;  Location: [facility];  Service: Urology;  Laterality: N/A;  no seeds in bladder per Dr Tresa Moore   EYE SURGERY  [id number]    for retinal detachment , L, Dr Zadie Rhine   HERNIA REPAIR     as an infant   NECK DISSECTION  12/14/2018   PROSTATE BIOPSY  2019   RADIOACTIVE SEED IMPLANT N/A 02/01/2018   Procedure: RADIOACTIVE SEED IMPLANT/BRACHYTHERAPY IMPLANT;  Surgeon: Alexis Frock, MD;  [facility];  Service: Urology;  Laterality: N/A;  58 seeds implanted   SPACE OAR INSTILLATION N/A 02/01/2018   Procedure: SPACE OAR INSTILLATION;  Surgeon: Alexis Frock, MD;  Location: [facility];  Service: Urology;  Laterality: N/A;   STERNOTOMY  12/14/2018    THYROIDECTOMY  12/14/2018   WISDOM TOOTH EXTRACTION     x2, Upper   Social History   Socioeconomic History   Marital status: Single    Spouse name: Not on file   Number of children: 2   Years of education: PHD   Highest education level: Not on file  Occupational History   Occupation: Retired: Prof [employer]  Tobacco Use   Smoking status: Never   Smokeless tobacco: Never  Vaping Use   Vaping Use: Never used  Substance and Sexual Activity   Alcohol use: No    Alcohol/week: 0.0 standard drinks    Comment: not in  years   Drug use: No   Sexual activity: Not Currently  Other Topics Concern   Not on file  Social History Narrative   2 children, one in [place] w/ pt   Patient is retired.   Education PHD    Right handed.   Caffeine Three cups of coffee daily.   Social Determinants of Health   Financial Resource Strain: Low Risk    Difficulty of Paying Living Expenses: Not hard at all  Food Insecurity: No Food Insecurity   Worried About Charity fundraiser in the Last Year: Never true  Ran Out of Food in the Last Year: Never true  Transportation Needs: No Transportation Needs   Lack of Transportation (Medical): No   Lack of Transportation (Non-Medical): No  Physical Activity: Sufficiently Active   Days of Exercise per Week: 7 days   Minutes of Exercise per Session: 120 min  Stress: No Stress Concern Present   Feeling of Stress : Not at all  Social Connections: Socially Isolated   Frequency of Communication with Friends and Family: More than three times a week   Frequency of Social Gatherings with Friends and Family: More than three times a week   Attends Religious Services: Never   Marine scientist or Organizations: No   Attends Archivist Meetings: Never   Marital Status: Never married  Intimate Partner Violence: Not At Risk   Fear of Current or Ex-Partner: No   Emotionally Abused: No   Physically Abused: No   Sexually Abused: No     Allergies as of 09/28/2020   No Known Allergies      Medication List        Accurate as of September 28, 2020 11:59 PM. If you have any questions, ask your nurse or doctor.          amLODipine-olmesartan 10-40 MG tablet Commonly known as: AZOR Take 1 tablet by mouth daily.   Bromfenac Sodium 0.09 % Soln Apply 1 drop to eye daily. Left eye only   levETIRAcetam 500 MG 24 hr tablet Commonly known as: Keppra XR Take 3 tablets (1,500 mg total) by mouth at bedtime.   levothyroxine 137 MCG tablet Commonly known as: SYNTHROID Take 137 mcg by mouth daily.   metoprolol tartrate 25 MG tablet Commonly known as: LOPRESSOR Take 12.5 mg by mouth 2 (two) times daily.           Objective:   Physical Exam BP 125/78 (BP Location: Left Arm, Patient Position: Sitting, Cuff Size: Normal)   Pulse 66   Temp 98.3 F (36.8 C) (Oral)   Resp 18   Ht 6\' 1"  (1.854 m)   Wt 222 lb 6.4 oz (100.9 kg)   SpO2 99%   BMI 29.34 kg/m  General: Well developed, NAD, BMI noted HEENT:  Normocephalic . Face symmetric, atraumatic Lungs:  CTA B Normal respiratory effort, no intercostal retractions, no accessory muscle use. Heart: RRR,  no murmur.  Abdomen:  Not distended, soft, non-tender. No rebound or rigidity.   Lower extremities: no pretibial edema bilaterally  Skin: Exposed areas without rash. Not pale. Not jaundice Neurologic:  alert & oriented X3.  Speech normal, gait appropriate for age and unassisted Strength symmetric and appropriate for age.  Psych: Cognition and judgment appear intact.  Cooperative with normal attention span and concentration.  Behavior appropriate. No anxious or depressed appearing. '     Assessment     Assessment  HTN Paroxysmal A. fib: DX 12-2018, postop.  No anticoagulation or ASA as of 01/24/2019 Seizure disorder, last activity 1998 R hearing loss, h/o ENT eval (-) Intracranial bleed due to fall 2013 Anemia -- Dr. Paulita Fujita Oncology: -Prostate  cancer: XRT 2019, rx  lupron -Papillary  thyroid cancer, s/p total thyroidectomy, sternotomy, mediastinal lymph dissection on 12/14/2018 H/o retina detachment  H/o back pain, NPH, no surgery 2008 H/o   moles in the back, saw  Dermatology 2015, told ok, was not rec to schedule a RTC   PLAN Here for CPX HTN: BP today is great, recommend to check at home, continue Azor.  Seizure disorder: Sees neurology regularly Prostate cancer, h/o per urology Thyroid cancer, h/o: per Endo Skin lesions: Has multiple skin lesions at the back, likes to see dermatology, he will call with the name of the physician he likes to see specifically. RTC 1 year    This visit occurred during the SARS-CoV-2 public health emergency.  Safety protocols were in place, including screening questions prior to the visit, additional usage of staff PPE, and extensive cleaning of exam room while observing appropriate contact time as indicated for disinfecting solutions.

## 2020-09-28 NOTE — Patient Instructions (Addendum)
Check the  blood pressure  BP GOAL is between 110/65 and  135/85. If it is consistently higher or lower, let me know      GO TO THE LAB : Get the blood work     [place], [place] back for  a physical exam in 1 year     "Living will", "[place] of attorney": Advanced care planning  (If you already have a living will or healthcare power of attorney, please bring the copy to be scanned in your chart.)  Advance care planning is a process that supports adults in  understanding and sharing their preferences regarding future medical care.   The patient's preferences are recorded in documents called Advance Directives.    Advanced directives are completed (and can be modified at any time) while the patient is in full mental capacity.   The documentation should be available at all times to the patient, the family and the healthcare providers.  Bring in a copy to be scanned in your chart is an excellent idea and is recommended   This legal documents direct treatment decision making and/or appoint a surrogate to make the decision if the patient is not capable to do so.    Advance directives can be documented in many types of formats,  documents have names such as:  Lliving will  Durable power of attorney for healthcare (healthcare proxy or healthcare power of attorney)  Combined directives  Physician orders for life-sustaining treatment    More information at:  [email or web address]

## 2020-09-29 ENCOUNTER — Encounter: Payer: Self-pay | Admitting: Internal Medicine

## 2020-09-29 NOTE — Assessment & Plan Note (Signed)
-  Tdap 2019 - pnm 23: 2014; prevnar 2016 - zostavax :2014 - shingrex :recommended  - s/p covid vaccines x4 - flu shot rec q year   --CCS: colonoscopy 2003,  cscope   04-2012, tubular adenoma, again 06-2015, due for repeat colonoscopy he already called GI  Dr Paulita Fujita. -- prostate cancer: per urology team --Labs: CMP, FLP, CBC --Continue with healthy lifestyle  -- POA: Discussed

## 2020-09-29 NOTE — Assessment & Plan Note (Signed)
Here for CPX HTN: BP today is great, recommend to check at home, continue Azor. Seizure disorder: Sees neurology regularly Prostate cancer, h/o per urology Thyroid cancer, h/o: per Endo Skin lesions: Has multiple skin lesions at the back, likes to see dermatology, he will call with the name of the physician he likes to see specifically. RTC 1 year

## 2020-10-01 MED ORDER — ATORVASTATIN CALCIUM 20 MG PO TABS: 20.0000 mg | ORAL_TABLET | Freq: Every day | ORAL | 3 refills | Status: DC

## 2020-10-01 NOTE — Addendum Note (Signed)
Addended byDamita Dunnings D on: 10/01/2020 09:47 AM   Modules accepted: Orders

## 2020-10-27 ENCOUNTER — Ambulatory Visit (INDEPENDENT_AMBULATORY_CARE_PROVIDER_SITE_OTHER): Payer: Medicare PPO | Admitting: Ophthalmology

## 2020-10-27 ENCOUNTER — Other Ambulatory Visit: Payer: Self-pay

## 2020-10-27 ENCOUNTER — Encounter (INDEPENDENT_AMBULATORY_CARE_PROVIDER_SITE_OTHER): Payer: Self-pay | Admitting: Ophthalmology

## 2020-10-27 DIAGNOSIS — H35352 Cystoid macular degeneration, left eye: Secondary | ICD-10-CM

## 2020-10-27 DIAGNOSIS — H43811 Vitreous degeneration, right eye: Secondary | ICD-10-CM | POA: Diagnosis not present

## 2020-10-27 NOTE — Assessment & Plan Note (Signed)
Patient has been using bromfenac once daily.  Patient is interested in a trial of this cessation of the drops to see if CME will stay resolved.  Encouraged him not to mash or compression the eye

## 2020-10-27 NOTE — Progress Notes (Signed)
10/27/2020     CHIEF COMPLAINT Patient presents for Retina Follow Up   HISTORY OF PRESENT ILLNESS: Joel [name] is a 75 y.o. male who presents to the clinic today for:   HPI     Retina Follow Up           Diagnosis: Other   Laterality: both eyes   Onset: 1 year ago   Severity: mild   Duration: 1 year   Course: stable         Comments   1 year fu OU OCT Pt states VA OU stable since last visit. Pt denies FOL, floaters, or ocular pain OU.  Pt states, "I use Bromfenac every morning in my left eye."        Last edited by Kendra Opitz, COA on 10/27/2020  2:59 PM.      Referring physician: Colon Branch, MD [address] [place],  [place] [address]  HISTORICAL INFORMATION:   Selected notes from the [facility]: No current outpatient medications on file. (Ophthalmic Drugs)   No current facility-administered medications for this visit. (Ophthalmic Drugs)   Current Outpatient Medications (Other)  Medication Sig   amLODipine-olmesartan (AZOR) 10-40 MG tablet Take 1 tablet by mouth daily.   atorvastatin (LIPITOR) 20 MG tablet Take 1 tablet (20 mg total) by mouth at bedtime.   levETIRAcetam (KEPPRA XR) 500 MG 24 hr tablet Take 3 tablets (1,500 mg total) by mouth at bedtime.   levothyroxine (SYNTHROID) 137 MCG tablet Take 137 mcg by mouth daily.   metoprolol tartrate (LOPRESSOR) 25 MG tablet Take 12.5 mg by mouth 2 (two) times daily.   No current facility-administered medications for this visit. (Other)      REVIEW OF SYSTEMS:    ALLERGIES No Known Allergies  PAST MEDICAL HISTORY Past Medical History:  Diagnosis Date   Anemia SEVERAL YRS AGO   Dr. Paulita Fujita   Back pain    HNP dx ~2008, no surgery RESOLVED NOW   Fall 05-2011    had a IC bleed    Hearing loss    Rt ear sensorineural hearing loss, . W/u by Dr. 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   Thyroid cancer ([place]) 2020   Past Surgical History:  Procedure Laterality Date   CATARACT EXTRACTION Bilateral    R 2014, L 03-2013   CRANIOTOMY  05/27/2011   Procedure: CRANIOTOMY HEMATOMA EVACUATION SUBDURAL;  Surgeon: Hosie Spangle, MD;  Location: [facility];  Service: Neurosurgery;  Laterality: Right;  Evacuation of Subdural Hematoma   CYSTOSCOPY N/A 02/01/2018   Procedure: CYSTOSCOPY;  Surgeon: Alexis Frock, MD;  Location: [facility];  Service: Urology;  Laterality: N/A;  no seeds in bladder per Dr Tresa Moore   EYE SURGERY  [phone]    for retinal detachment , L, Dr Zadie Rhine   HERNIA REPAIR     as an infant   NECK DISSECTION  12/14/2018   PROSTATE BIOPSY  2019   RADIOACTIVE SEED IMPLANT N/A 02/01/2018   Procedure: RADIOACTIVE SEED IMPLANT/BRACHYTHERAPY IMPLANT;  Surgeon: Alexis Frock, MD;  Location: [facility];  Service: Urology;  Laterality: N/A;  58 seeds implanted   SPACE OAR INSTILLATION N/A 02/01/2018   Procedure: SPACE OAR INSTILLATION;  Surgeon: Alexis Frock, MD;  [facility];  Service: Urology;  Laterality: N/A;   STERNOTOMY  12/14/2018   THYROIDECTOMY  12/14/2018   WISDOM TOOTH EXTRACTION     x2, Upper    FAMILY HISTORY Family History  Problem Relation Age of Onset   Emphysema Mother        smoker   Hypertension Father        F and B    Leukemia Sister    Prostate cancer Brother 41   Diabetes Neg Hx    Colon cancer Neg Hx    Coronary artery disease Neg Hx     SOCIAL HISTORY Social History   Tobacco Use   Smoking status: Never   Smokeless tobacco: Never  Vaping Use   Vaping Use: Never used  Substance Use Topics   Alcohol use: No    Alcohol/week: 0.0 standard drinks    Comment: not in  years   Drug use: No         OPHTHALMIC EXAM:  Base Eye Exam     Visual Acuity (ETDRS)        Right Left   Dist [place] 20/50 -1 20/30 -1   Dist ph [place] 20/25 -1 NI         Tonometry (Tonopen, 3:03 PM)       Right Left   Pressure 17 15         Pupils       Pupils Dark Light Shape React APD   Right PERRL 3 2.5 Round Sluggish None   Left PERRL 3 2  Brisk None         Visual Fields (Counting fingers)       Left Right    Full Full         Extraocular Movement       Right Left    Full Full         Neuro/Psych     Oriented x3: Yes   Mood/Affect: Normal         Dilation     Both eyes: 1.0% Mydriacyl, 2.5% Phenylephrine @ 3:03 PM           Slit Lamp and Fundus Exam     External Exam       Right Left   External Normal Normal         Slit Lamp Exam       Right Left   Lids/Lashes Normal Normal   Conjunctiva/Sclera White and quiet White and quiet   Cornea Clear Clear   Anterior Chamber Deep and quiet Deep and quiet   Iris Round and reactive Round and reactive, TI defect at 730 meridian, could represent iris chafe from IOL   Lens Centered posterior chamber intraocular lens Centered posterior chamber intraocular lens   Anterior Vitreous Normal Normal         Fundus Exam       Right Left   Posterior Vitreous Posterior vitreous detachment, Central vitreous floaters Vitrectomized   Disc Normal Normal   C/D Ratio 0.65 0.55   Macula Normal Normal   Vessels Normal Normal   Periphery Normal, no holes or tears Good chorioretinal scar, retinopexy and intact buckle, no breaks            IMAGING AND PROCEDURES  Imaging and Procedures for 10/27/20  OCT, Retina - OU - Both Eyes       Right Eye Quality was good. Scan locations included subfoveal. Central  Foveal Thickness: 263. Progression has been stable. Findings include normal observations.   Left Eye Quality was good. Scan locations included subfoveal. Central Foveal Thickness: 253. Progression has been stable. Findings include normal foveal contour.   Notes PVD OD  OS No CME              ASSESSMENT/PLAN:  Cystoid macular edema of left eye Patient has been using bromfenac once daily.  Patient is interested in a trial of this cessation of the drops to see if CME will stay resolved.  Encouraged him not to mash or compression the eye     ICD-10-CM   1. Posterior vitreous detachment of right eye  H43.811 OCT, Retina - OU - Both Eyes    2. Cystoid macular edema of left eye  H35.352 OCT, Retina - OU - Both Eyes      1.  OS, CME has resolved chronic use of topical NSAIDs for CME.  We will attempt a trial of cessation of bromfenac topically left eye due to determine if the CME will remain quiescent and resolved.  2.  Patient instructed to contact the office promptly should new onset visual acuity changes develop particularly in the left eye with blurred vision most noticeably at near or reading vision  3.  Ophthalmic Meds Ordered this visit:  No orders of the defined types were placed in this encounter.      Return in about 1 year (around 10/27/2021) for dilate, OCT.  There are no Patient Instructions on file for this visit.   Explained the diagnoses, plan, and follow up with the patient and they expressed understanding.  Patient expressed understanding of the importance of proper follow up care.   Clent Demark Kessie Croston M.D. Diseases & Surgery of the Retina and Vitreous [facility] [place] 10/27/20     Abbreviations: M myopia (nearsighted); A astigmatism; H hyperopia (farsighted); P presbyopia; Mrx spectacle prescription;  CTL contact lenses; OD right eye; OS left eye; OU both eyes  XT exotropia; ET esotropia; PEK punctate epithelial keratitis; PEE punctate epithelial erosions; DES dry eye syndrome; MGD meibomian gland dysfunction; ATs artificial tears; PFAT's preservative free artificial tears; [place] nuclear sclerotic cataract; PSC posterior subcapsular cataract; ERM epi-retinal membrane; PVD posterior vitreous detachment; RD retinal detachment; DM  diabetes mellitus; DR diabetic retinopathy; NPDR non-proliferative diabetic retinopathy; PDR proliferative diabetic retinopathy; CSME clinically significant macular edema; DME diabetic macular edema; dbh dot blot hemorrhages; CWS cotton wool spot; POAG primary open angle glaucoma; C/D cup-to-disc ratio; HVF humphrey visual field; GVF goldmann visual field; OCT optical coherence tomography; IOP intraocular pressure; BRVO Branch retinal vein occlusion; CRVO central retinal vein occlusion; CRAO central retinal artery occlusion; BRAO branch retinal artery occlusion; RT retinal tear; SB scleral buckle; PPV pars plana vitrectomy; VH Vitreous hemorrhage; PRP panretinal laser photocoagulation; IVK intravitreal kenalog; VMT vitreomacular traction; MH Macular hole;  NVD neovascularization of the disc; NVE neovascularization elsewhere; AREDS age related eye disease study; ARMD age related macular degeneration; POAG primary open angle glaucoma; EBMD epithelial/anterior basement membrane dystrophy; ACIOL anterior chamber intraocular lens; IOL intraocular lens; PCIOL posterior chamber intraocular lens; Phaco/IOL phacoemulsification with intraocular lens placement; [place] photorefractive keratectomy; LASIK laser assisted in situ keratomileusis; HTN hypertension; DM diabetes mellitus; COPD chronic obstructive pulmonary disease

## 2020-11-04 ENCOUNTER — Other Ambulatory Visit (INDEPENDENT_AMBULATORY_CARE_PROVIDER_SITE_OTHER): Payer: Medicare PPO

## 2020-11-04 ENCOUNTER — Other Ambulatory Visit: Payer: Self-pay

## 2020-11-04 DIAGNOSIS — E785 Hyperlipidemia, unspecified: Secondary | ICD-10-CM | POA: Diagnosis not present

## 2020-11-04 LAB — LIPID PANEL
Cholesterol: 102 mg/dL (ref 0–200)
HDL: 37.9 mg/dL — ABNORMAL LOW (ref >39.00)
LDL Cholesterol: 50 mg/dL (ref 0–99)
NonHDL: 63.99
Total CHOL/HDL Ratio: 3
Triglycerides: 71 mg/dL (ref 0.0–149.0)
VLDL: 14.2 mg/dL (ref 0.0–40.0)

## 2020-11-04 LAB — ALT: ALT: 12 U/L (ref 0–53)

## 2020-11-04 LAB — AST: AST: 19 U/L (ref 0–37)

## 2020-11-09 MED ORDER — ATORVASTATIN CALCIUM 20 MG PO TABS: 20.0000 mg | ORAL_TABLET | Freq: Every day | ORAL | 3 refills | Status: DC

## 2020-11-09 NOTE — Addendum Note (Signed)
Addended byDamita Dunnings D on: 11/09/2020 08:03 AM   Modules accepted: Orders

## 2020-11-17 DIAGNOSIS — C61 Malignant neoplasm of prostate: Secondary | ICD-10-CM | POA: Diagnosis not present

## 2020-11-17 LAB — PSA: PSA: <0.015

## 2020-11-23 DIAGNOSIS — N5201 Erectile dysfunction due to arterial insufficiency: Secondary | ICD-10-CM | POA: Diagnosis not present

## 2020-11-23 DIAGNOSIS — R310 Gross hematuria: Secondary | ICD-10-CM | POA: Diagnosis not present

## 2020-11-23 DIAGNOSIS — N200 Calculus of kidney: Secondary | ICD-10-CM | POA: Diagnosis not present

## 2020-11-23 DIAGNOSIS — C61 Malignant neoplasm of prostate: Secondary | ICD-10-CM | POA: Diagnosis not present

## 2020-11-23 DIAGNOSIS — R3915 Urgency of urination: Secondary | ICD-10-CM | POA: Diagnosis not present

## 2020-12-02 ENCOUNTER — Encounter: Payer: Self-pay | Admitting: Internal Medicine

## 2020-12-28 DIAGNOSIS — Z86010 Personal history of colonic polyps: Secondary | ICD-10-CM | POA: Diagnosis not present

## 2020-12-28 LAB — HM COLONOSCOPY

## 2021-01-26 ENCOUNTER — Other Ambulatory Visit: Payer: Self-pay | Admitting: Internal Medicine

## 2021-02-02 DIAGNOSIS — I480 Paroxysmal atrial fibrillation: Secondary | ICD-10-CM | POA: Diagnosis not present

## 2021-02-02 DIAGNOSIS — I10 Essential (primary) hypertension: Secondary | ICD-10-CM | POA: Diagnosis not present

## 2021-02-02 DIAGNOSIS — E039 Hypothyroidism, unspecified: Secondary | ICD-10-CM | POA: Diagnosis not present

## 2021-02-05 DIAGNOSIS — I480 Paroxysmal atrial fibrillation: Secondary | ICD-10-CM | POA: Diagnosis not present

## 2021-02-22 DIAGNOSIS — Z85850 Personal history of malignant neoplasm of thyroid: Secondary | ICD-10-CM | POA: Diagnosis not present

## 2021-02-22 DIAGNOSIS — E890 Postprocedural hypothyroidism: Secondary | ICD-10-CM | POA: Diagnosis not present

## 2021-02-22 DIAGNOSIS — C73 Malignant neoplasm of thyroid gland: Secondary | ICD-10-CM | POA: Diagnosis not present

## 2021-02-22 DIAGNOSIS — Z79890 Hormone replacement therapy (postmenopausal): Secondary | ICD-10-CM | POA: Diagnosis not present

## 2021-03-09 DIAGNOSIS — I441 Atrioventricular block, second degree: Secondary | ICD-10-CM | POA: Diagnosis not present

## 2021-03-11 ENCOUNTER — Other Ambulatory Visit: Payer: Self-pay | Admitting: Internal Medicine

## 2021-03-25 DIAGNOSIS — E890 Postprocedural hypothyroidism: Secondary | ICD-10-CM | POA: Diagnosis not present

## 2021-03-25 DIAGNOSIS — C73 Malignant neoplasm of thyroid gland: Secondary | ICD-10-CM | POA: Diagnosis not present

## 2021-04-29 ENCOUNTER — Ambulatory Visit (INDEPENDENT_AMBULATORY_CARE_PROVIDER_SITE_OTHER): Payer: Medicare PPO

## 2021-04-29 ENCOUNTER — Ambulatory Visit: Payer: Medicare PPO

## 2021-04-29 VITALS — BP 118/80 | HR 46 | Temp 98.9°F | Resp 16 | Ht 73.0 in | Wt 216.8 lb

## 2021-04-29 DIAGNOSIS — Z0000 Encounter for general adult medical examination without abnormal findings: Secondary | ICD-10-CM | POA: Diagnosis not present

## 2021-04-29 NOTE — Progress Notes (Signed)
Subjective:   Joel [name] is a 76 y.o. male who presents for Medicare Annual/Subsequent preventive examination.  Review of Systems     Cardiac Risk Factors include: advanced age (>79men, >49 women);male gender;hypertension;dyslipidemia     Objective:    Today's Vitals   04/29/21 1503  BP: 118/80  Resp: 16  Temp: 98.9 F (37.2 C)  TempSrc: Oral  SpO2: 98%  Weight: 216 lb 12.8 oz (98.3 kg)  Height: 6\' 1"  (1.854 m)   Body mass index is 28.6 kg/m.  Advanced Directives 04/29/2021 04/23/2020 02/14/2019 05/10/2018 02/01/2018 07/21/2017 07/11/2016  Does Patient Have a Medical Advance Directive? No No No No No No No  Does patient want to make changes to medical advance directive? - - - - - - Yes (MAU/Ambulatory/Procedural Areas - Information given)  Would patient like information on creating a medical advance directive? No - Patient declined Yes (MAU/Ambulatory/Procedural Areas - Information given) No - Patient declined No - Patient declined No - Patient declined Yes (MAU/Ambulatory/Procedural Areas - Information given) -  Pre-existing out of facility DNR order (yellow form or pink MOST form) - - - - - - -    Current Medications (verified) Outpatient Encounter Medications as of 04/29/2021  Medication Sig   amLODipine-olmesartan (AZOR) 10-40 MG tablet TAKE 1 TABLET BY MOUTH DAILY   atorvastatin (LIPITOR) 20 MG tablet TAKE 1 TABLET(20 MG) BY MOUTH AT BEDTIME   levETIRAcetam (KEPPRA XR) 500 MG 24 hr tablet Take 3 tablets (1,500 mg total) by mouth at bedtime.   levothyroxine (SYNTHROID) 125 MCG tablet Take 1 tablet by mouth daily.   metoprolol tartrate (LOPRESSOR) 25 MG tablet Take 12.5 mg by mouth 2 (two) times daily.   [DISCONTINUED] levothyroxine (SYNTHROID) 137 MCG tablet Take 137 mcg by mouth daily.   No facility-administered encounter medications on file as of 04/29/2021.    Allergies (verified) Patient has no known allergies.   History: Past Medical History:  Diagnosis  Date   Anemia SEVERAL YRS AGO   Dr. Paulita Fujita   Back pain    HNP dx ~2008, no surgery RESOLVED NOW   Fall 05-2011    had a IC bleed    Hearing loss    Rt ear sensorineural hearing loss, . W/u by Dr. Remer Macho extensively negative   HTN (hypertension)    Prostate cancer ([place]) 2019   Seizure disorder [facility])    Dx remotely, last SZ activity aprox 1998   Subdural hematoma 2013   Thyroid cancer ([place]) 2020   Past Surgical History:  Procedure Laterality Date   CATARACT EXTRACTION Bilateral    R 2014, L 03-2013   CRANIOTOMY  05/27/2011   Procedure: CRANIOTOMY HEMATOMA EVACUATION SUBDURAL;  Surgeon: Hosie Spangle, MD;  Location: [facility];  Service: Neurosurgery;  Laterality: Right;  Evacuation of Subdural Hematoma   CYSTOSCOPY N/A 02/01/2018   Procedure: CYSTOSCOPY;  Surgeon: Alexis Frock, MD;  Location: [facility];  Service: Urology;  Laterality: N/A;  no seeds in bladder per Dr Tresa Moore   EYE SURGERY  [phone]    for retinal detachment , L, Dr Zadie Rhine   HERNIA REPAIR     as an infant   NECK DISSECTION  12/14/2018   PROSTATE BIOPSY  2019   RADIOACTIVE SEED IMPLANT N/A 02/01/2018   Procedure: RADIOACTIVE SEED IMPLANT/BRACHYTHERAPY IMPLANT;  Surgeon: Alexis Frock, MD;  Location: [facility];  Service: Urology;  Laterality: N/A;  58 seeds implanted   SPACE OAR INSTILLATION N/A 02/01/2018  Procedure: SPACE OAR INSTILLATION;  Surgeon: Alexis Frock, MD;  Location: [facility];  Service: Urology;  Laterality: N/A;   STERNOTOMY  12/14/2018   THYROIDECTOMY  12/14/2018   WISDOM TOOTH EXTRACTION     x2, Upper   Family History  Problem Relation Age of Onset   Emphysema Mother        smoker   Hypertension Father        F and B    Leukemia Sister    Prostate cancer Brother 70   Diabetes Neg Hx    Colon cancer Neg Hx    Coronary artery disease Neg Hx    Social History   Socioeconomic History   Marital status: Single    Spouse name:  Not on file   Number of children: 2   Years of education: PHD   Highest education level: Not on file  Occupational History   Occupation: Retired: Prof [employer]  Tobacco Use   Smoking status: Never   Smokeless tobacco: Never  Vaping Use   Vaping Use: Never used  Substance and Sexual Activity   Alcohol use: No    Alcohol/week: 0.0 standard drinks    Comment: not in  years   Drug use: No   Sexual activity: Not Currently  Other Topics Concern   Not on file  Social History Narrative   2 children, one in [place] w/ pt   Patient is retired.   Education PHD    Right handed.   Caffeine Three cups of coffee daily.   Social Determinants of Health   Financial Resource Strain: Low Risk    Difficulty of Paying Living Expenses: Not hard at all  Food Insecurity: No Food Insecurity   Worried About Charity fundraiser in the Last Year: Never true   [place] in the Last Year: Never true  Transportation Needs: No Transportation Needs   Lack of Transportation (Medical): No   Lack of Transportation (Non-Medical): No  Physical Activity: Sufficiently Active   Days of Exercise per Week: 7 days   Minutes of Exercise per Session: 150+ min  Stress: No Stress Concern Present   Feeling of Stress : Not at all  Social Connections: Moderately Isolated   Frequency of Communication with Friends and Family: Once a week   Frequency of Social Gatherings with Friends and Family: More than three times a week   Attends Religious Services: Never   Marine scientist or Organizations: Yes   Attends Music therapist: More than 4 times per year   Marital Status: Divorced    Tobacco Counseling Counseling given: Not Answered   Clinical Intake:  Pre-visit preparation completed: Yes  Pain : No/denies pain     BMI - recorded: 28.6 Nutritional Status: BMI 25 -29 Overweight Nutritional Risks: None Diabetes: No  How often do you need to have someone help you  when you read instructions, pamphlets, or other written materials from your doctor or pharmacy?: 1 - Never  Diabetic?No  Interpreter Needed?: No  Information entered by :: Caroleen Hamman LPN   Activities of Daily Living In your present state of health, do you have any difficulty performing the following activities: 04/29/2021  Hearing? N  Vision? N  Difficulty concentrating or making decisions? N  Walking or climbing stairs? N  Dressing or bathing? N  Doing errands, shopping? N  Preparing Food and eating ? N  Using the Toilet? N  In the past six months, have you accidently leaked urine? N  Do you have problems with loss of bowel control? N  Managing your Medications? N  Managing your Finances? N  Housekeeping or managing your Housekeeping? N  Some recent data might be hidden    Patient Care Team: Colon Branch, MD as PCP - General Sharyne Peach, MD as Consulting Physician (Ophthalmology) Zadie Rhine Clent Demark, MD as Consulting Physician (Ophthalmology) Marcial Pacas, MD as Consulting Physician (Neurology) Arta Silence, MD as Consulting Physician (Gastroenterology) Alexis Frock, MD as Consulting Physician (Urology) Jerrell Belfast, MD as Consulting Physician (Otolaryngology) Francina Ames, MD as Referring Physician (Otolaryngology) Moss Mc, MD as Referring Physician (Cardiology) Kathlene Cote, MD as Referring Physician (Endocrinology)  Indicate any recent Medical Services you may have received from other than [facility] providers in the past year (date may be approximate).     Assessment:   This is a routine wellness examination for Wm.  Hearing/Vision screen Hearing Screening - Comments:: No issues Vision Screening - Comments:: Last eye exam-2022-Dr. Rankin  Dietary issues and exercise activities discussed: Current Exercise Habits: Home exercise routine, Type of exercise: strength training/weights, Time (Minutes): 60, Frequency (Times/Week): 7,  Weekly Exercise (Minutes/Week): 420, Intensity: Mild, Exercise limited by: None identified   Goals Addressed             This Visit's Progress    Patient Stated   On track    Maintain current health & level of activity       Depression Screen PHQ 2/9 Scores 04/29/2021 09/28/2020 04/23/2020 02/14/2019 01/24/2019 09/28/2017 07/21/2017  PHQ - 2 Score 0 0 0 0 0 0 0    Fall Risk Fall Risk  04/29/2021 09/28/2020 04/23/2020 02/14/2019 09/25/2018  Falls in the past year? 0 0 0 1 0  Number falls in past yr: 0 0 0 - 0  Injury with Fall? 0 0 0 - -  Follow up Falls prevention discussed - Falls prevention discussed Education provided;Falls prevention discussed -    FALL RISK PREVENTION PERTAINING TO THE HOME:  Any stairs in or around the home? No  Home free of loose throw rugs in walkways, pet beds, electrical cords, etc? Yes  Adequate lighting in your home to reduce risk of falls? Yes   ASSISTIVE DEVICES UTILIZED TO PREVENT FALLS:  Life alert? No  Use of a cane, walker or w/c? No  Grab bars in the bathroom? Yes  Shower chair or bench in shower? No  Elevated toilet seat or a handicapped toilet? No   TIMED UP AND GO:  Was the test performed? Yes .  Length of time to ambulate 10 feet: 11 sec.   Gait steady and fast without use of assistive device  Cognitive Function:Normal cognitive status assessed by direct observation by this Nurse Health Advisor. No abnormalities found.    MMSE - Mini Mental State Exam 07/11/2016  Orientation to time 5  Orientation to Place 5  Registration 3  Attention/ Calculation 5  Recall 3  Language- name 2 objects 2  Language- repeat 1  Language- follow 3 step command 3  Language- read & follow direction 1  Write a sentence 1  Copy design 1  Total score 30        Immunizations Immunization History  Administered Date(s) Administered   Fluad Quad(high Dose 65+) 11/22/2018, 03/03/2020   Influenza Whole 02/12/2007, 01/09/2008, 12/24/2008, 12/17/2009,  01/20/2013   Influenza, High Dose Seasonal PF 01/12/2018, 03/03/2020   Influenza-Unspecified 10/31/2015, 01/10/2017, 12/12/2020  PFIZER Comirnaty(Gray Top)Covid-19 Tri-Sucrose Vaccine 01/11/2020   PFIZER(Purple Top)SARS-COV-2 Vaccination 05/05/2019, 05/29/2019, 01/11/2020, 06/17/2020   Pneumococcal Conjugate-13 05/30/2014, 10/31/2015   Pneumococcal Polysaccharide-23 04/03/2012   Td 05/16/2007   Tdap 07/23/2017   Zoster, Live 05/04/2012    TDAP status: Up to date  Flu Vaccine status: Up to date  Pneumococcal vaccine status: Up to date  Covid-19 vaccine status: Completed vaccines per patient  Qualifies for Shingles Vaccine? Yes   Zostavax completed Yes   Shingrix Completed?: No.    Education has been provided regarding the importance of this vaccine. Patient has been advised to call insurance company to determine out of pocket expense if they have not yet received this vaccine. Advised may also receive vaccine at local pharmacy or Health Dept. Verbalized acceptance and understanding.  Screening Tests Health Maintenance  Topic Date Due   Zoster Vaccines- Shingrix (1 of 2) Never done   COLONOSCOPY (Pts 45-64yrs Insurance coverage will need to be confirmed)  06/23/2020   COVID-19 Vaccine (6 - Booster for Pfizer series) 08/12/2020   TETANUS/TDAP  07/24/2027   Pneumonia Vaccine 86+ Years old  Completed   INFLUENZA VACCINE  Completed   Hepatitis C Screening  Completed   HPV VACCINES  Aged Out    Health Maintenance  Health Maintenance Due  Topic Date Due   Zoster Vaccines- Shingrix (1 of 2) Never done   COLONOSCOPY (Pts 45-80yrs Insurance coverage will need to be confirmed)  06/23/2020   COVID-19 Vaccine (6 - Booster for [place] series) 08/12/2020    Colorectal cancer screening: Type of screening: Colonoscopy. Completed 2022-per patient. Repeat every 5 years per patient  Lung Cancer Screening: (Low Dose CT Chest recommended if Age 40-80 years, 30 pack-year currently smoking OR  have quit w/in 15years.) does not qualify.     Additional Screening:  Hepatitis C Screening: Completed 07/11/2016  Vision Screening: Recommended annual ophthalmology exams for early detection of glaucoma and other disorders of the eye. Is the patient up to date with their annual eye exam?  Yes  Who is the provider or what is the name of the office in which the patient attends annual eye exams? Dr. Zadie Rhine   Dental Screening: Recommended annual dental exams for proper oral hygiene  Community Resource Referral / Chronic Care Management: CRR required this visit?  No   CCM required this visit?  No      Plan:     I have personally reviewed and noted the following in the patients chart:   Medical and social history Use of alcohol, tobacco or illicit drugs  Current medications and supplements including opioid prescriptions. Patient is not currently taking opioid prescriptions. Functional ability and status Nutritional status Physical activity Advanced directives List of other physicians Hospitalizations, surgeries, and ER visits in previous 12 months Vitals Screenings to include cognitive, depression, and falls Referrals and appointments  In addition, I have reviewed and discussed with patient certain preventive protocols, quality metrics, and best practice recommendations. A written personalized care plan for preventive services as well as general preventive health recommendations were provided to patient.   Patient would like to access avs on mychart.  Marta Antu, LPN   5/32/9924  Nurse Health Advisor  Nurse Notes: None

## 2021-04-29 NOTE — Patient Instructions (Signed)
Joel [name] , Thank you for taking time to come for your Medicare Wellness Visit. I appreciate your ongoing commitment to your health goals. Please review the following plan we discussed and let me know if I can assist you in the future.   Screening recommendations/referrals: Colonoscopy: Per our conversation completed 2022. Please have copy of report sent to PCP. Recommended yearly ophthalmology/optometry visit for glaucoma screening and checkup Recommended yearly dental visit for hygiene and checkup  Vaccinations: Influenza vaccine: Up to date Pneumococcal vaccine: Up to date Tdap vaccine: Up to date Shingles vaccine: Completed vaccines-Please bring vaccination dates for your chart . Covid-19: Up to date  Advanced directives: Declined information today.  Conditions/risks identified: See problem list  Next appointment: Follow up in one year for your annual wellness visit.   Preventive Care 74 Years and Older, Male Preventive care refers to lifestyle choices and visits with your health care provider that can promote health and wellness. What does preventive care include? A yearly physical exam. This is also called an annual well check. Dental exams once or twice a year. Routine eye exams. Ask your health care provider how often you should have your eyes checked. Personal lifestyle choices, including: Daily care of your teeth and gums. Regular physical activity. Eating a healthy diet. Avoiding tobacco and drug use. Limiting alcohol use. Practicing safe sex. Taking low doses of aspirin every day. Taking vitamin and mineral supplements as recommended by your health care provider. What happens during an annual well check? The services and screenings done by your health care provider during your annual well check will depend on your age, overall health, lifestyle risk factors, and family history of disease. Counseling  Your health care provider may ask you questions about  your: Alcohol use. Tobacco use. Drug use. Emotional well-being. Home and relationship well-being. Sexual activity. Eating habits. History of falls. Memory and ability to understand (cognition). Work and work Statistician. Screening  You may have the following tests or measurements: Height, weight, and BMI. Blood pressure. Lipid and cholesterol levels. These may be checked every 5 years, or more frequently if you are over 74 years old. Skin check. Lung cancer screening. You may have this screening every year starting at age 15 if you have a 30-pack-year history of smoking and currently smoke or have quit within the past 15 years. Fecal occult blood test (FOBT) of the stool. You may have this test every year starting at age 74. Flexible sigmoidoscopy or colonoscopy. You may have a sigmoidoscopy every 5 years or a colonoscopy every 10 years starting at age 74. Prostate cancer screening. Recommendations will vary depending on your family history and other risks. Hepatitis C blood test. Hepatitis B blood test. Sexually transmitted disease (STD) testing. Diabetes screening. This is done by checking your blood sugar (glucose) after you have not eaten for a while (fasting). You may have this done every 1-3 years. Abdominal aortic aneurysm (AAA) screening. You may need this if you are a current or former smoker. Osteoporosis. You may be screened starting at age 74 if you are at high risk. Talk with your health care provider about your test results, treatment options, and if necessary, the need for more tests. Vaccines  Your health care provider may recommend certain vaccines, such as: Influenza vaccine. This is recommended every year. Tetanus, diphtheria, and acellular pertussis (Tdap, Td) vaccine. You may need a Td booster every 10 years. Zoster vaccine. You may need this after age 74. Pneumococcal 13-valent conjugate (PCV13) vaccine.  One dose is recommended after age 74. Pneumococcal  polysaccharide (PPSV23) vaccine. One dose is recommended after age 74. Talk to your health care provider about which screenings and vaccines you need and how often you need them. This information is not intended to replace advice given to you by your health care provider. Make sure you discuss any questions you have with your health care provider. Document Released: 03/27/2015 Document Revised: 11/18/2015 Document Reviewed: 12/30/2014 Elsevier Interactive Patient Education  2017 [place] Prevention in the Home Falls can cause injuries. They can happen to people of all ages. There are many things you can do to make your home safe and to help prevent falls. What can I do on the outside of my home? Regularly fix the edges of walkways and driveways and fix any cracks. Remove anything that might make you trip as you walk through a door, such as a raised step or threshold. Trim any bushes or trees on the path to your home. Use bright outdoor lighting. Clear any walking paths of anything that might make someone trip, such as rocks or tools. Regularly check to see if handrails are loose or broken. Make sure that both sides of any steps have handrails. Any raised decks and porches should have guardrails on the edges. Have any leaves, snow, or ice cleared regularly. Use sand or salt on walking paths during winter. Clean up any spills in your garage right away. This includes oil or grease spills. What can I do in the bathroom? Use night lights. Install grab bars by the toilet and in the tub and shower. Do not use towel bars as grab bars. Use non-skid mats or decals in the tub or shower. If you need to sit down in the shower, use a plastic, non-slip stool. Keep the floor dry. Clean up any water that spills on the floor as soon as it happens. Remove soap buildup in the tub or shower regularly. Attach bath mats securely with double-sided non-slip rug tape. Do not have throw rugs and other  things on the floor that can make you trip. What can I do in the bedroom? Use night lights. Make sure that you have a light by your bed that is easy to reach. Do not use any sheets or blankets that are too big for your bed. They should not hang down onto the floor. Have a firm chair that has side arms. You can use this for support while you get dressed. Do not have throw rugs and other things on the floor that can make you trip. What can I do in the kitchen? Clean up any spills right away. Avoid walking on wet floors. Keep items that you use a lot in easy-to-reach places. If you need to reach something above you, use a strong step stool that has a grab bar. Keep electrical cords out of the way. Do not use floor polish or wax that makes floors slippery. If you must use wax, use non-skid floor wax. Do not have throw rugs and other things on the floor that can make you trip. What can I do with my stairs? Do not leave any items on the stairs. Make sure that there are handrails on both sides of the stairs and use them. Fix handrails that are broken or loose. Make sure that handrails are as long as the stairways. Check any carpeting to make sure that it is firmly attached to the stairs. Fix any carpet that is loose or  worn. Avoid having throw rugs at the top or bottom of the stairs. If you do have throw rugs, attach them to the floor with carpet tape. Make sure that you have a light switch at the top of the stairs and the bottom of the stairs. If you do not have them, ask someone to add them for you. What else can I do to help prevent falls? Wear shoes that: Do not have high heels. Have rubber bottoms. Are comfortable and fit you well. Are closed at the toe. Do not wear sandals. If you use a stepladder: Make sure that it is fully opened. Do not climb a closed stepladder. Make sure that both sides of the stepladder are locked into place. Ask someone to hold it for you, if possible. Clearly  mark and make sure that you can see: Any grab bars or handrails. First and last steps. Where the edge of each step is. Use tools that help you move around (mobility aids) if they are needed. These include: Canes. Walkers. Scooters. Crutches. Turn on the lights when you go into a dark area. Replace any light bulbs as soon as they burn out. Set up your furniture so you have a clear path. Avoid moving your furniture around. If any of your floors are uneven, fix them. If there are any pets around you, be aware of where they are. Review your medicines with your doctor. Some medicines can make you feel dizzy. This can increase your chance of falling. Ask your doctor what other things that you can do to help prevent falls. This information is not intended to replace advice given to you by your health care provider. Make sure you discuss any questions you have with your health care provider. Document Released: 12/25/2008 Document Revised: 08/06/2015 Document Reviewed: 04/04/2014 Elsevier Interactive Patient Education  2017 [employer].

## 2021-05-17 DIAGNOSIS — C61 Malignant neoplasm of prostate: Secondary | ICD-10-CM | POA: Diagnosis not present

## 2021-05-17 LAB — PSA: PSA: <0.015

## 2021-05-24 DIAGNOSIS — N5201 Erectile dysfunction due to arterial insufficiency: Secondary | ICD-10-CM | POA: Diagnosis not present

## 2021-05-24 DIAGNOSIS — N200 Calculus of kidney: Secondary | ICD-10-CM | POA: Diagnosis not present

## 2021-05-24 DIAGNOSIS — C61 Malignant neoplasm of prostate: Secondary | ICD-10-CM | POA: Diagnosis not present

## 2021-05-24 DIAGNOSIS — R3915 Urgency of urination: Secondary | ICD-10-CM | POA: Diagnosis not present

## 2021-05-26 ENCOUNTER — Encounter: Payer: Self-pay | Admitting: Internal Medicine

## 2021-06-30 ENCOUNTER — Encounter: Payer: Self-pay | Admitting: Neurology

## 2021-06-30 ENCOUNTER — Ambulatory Visit: Payer: Medicare PPO | Admitting: Neurology

## 2021-06-30 VITALS — BP 129/69 | HR 50 | Ht 73.0 in | Wt 220.5 lb

## 2021-06-30 DIAGNOSIS — G40909 Epilepsy, unspecified, not intractable, without status epilepticus: Secondary | ICD-10-CM | POA: Diagnosis not present

## 2021-06-30 MED ORDER — LEVETIRACETAM ER 500 MG PO TB24: 1500.0000 mg | ORAL_TABLET | Freq: Every day | ORAL | 4 refills | Status: DC

## 2021-06-30 NOTE — Patient Instructions (Signed)
Continue the Keppra, have your primary care doctor refill in future, return here if needed, call for any seizures ?

## 2021-06-30 NOTE — Progress Notes (Signed)
? ? ?Patient: Joel [name] ?Date of Birth: 1945/06/19 ? ?Reason for Visit: follow up for seizure ?History From: patient ?Primary Neurologist: Dr. Krista Blue  ? ?Joel [name] isa 76 years old right handed African American male with a history of seizures beginning since March of 1993. Previously patients of Dr. of Dr. Erling Cruz, last clinical visit was February 2014. ?He would get up in the middle of the night to go the bathroom and awakened the next morning in bed with his tongue biten. Evaluation in 1993 with sleep deprived EEG and CAT scan of the brain without and with contrast were normal. 2-D echocardiogram and Holter monitoring evaluation and treadmill were normal. He had a similar episode of blacking out in the middle of the night in May of 1993. In May of 1994 he was admitted to [facility]. At that time he had an episode during the daytime and had loss of consciousness with jerking of his arms and legs and bit his tongue without urinary incontinence. EEG was normal and  he was placed on Dilantin since 1994. He developed transient elevated liver function tests with GGT 232. EEG 08/22/95 was normal. ?MRI study of the  brain 05/21/96 showed a small left arachnoid cyst in the anterior temporal fossa, and questionable atrohy in the left hippocampus.  ?He had a seizure at his wife's funeral in March of 1998. He denies macropsia, micropsia, d?j? vu, strange odors or tastes.  ?  ?Phenytoin level 06/23/09 was 9.0. DEXA scan 06/30/2009 revealed normal T. Scores. ?He goes to a fitness center 7 days per week doing cardio, strength training, and "spins". He can balance on the balls of his feet for a long time.  ?He has had an epidural shot for lower back and left leg pain with HNP on the left at L5-S1. He is independent in activities of daily living. He denies macropsia,micropsia, strange odors or tastes. ?He had  sudden right ear hearing loss in 07/2010 and underwent MRI study 07/2010 and 08/31/2010 which was   unremarkable except for small arachnoid cyst in the anterior aspect of the left middle fossa .He underwent a course of prednisone without benefit. ?In mid December 2012 he got up in the middle of the night ,was on his toilet to void, stood up and next remembers awaking on the floor. He went back to bed and the next morning noted blood on his pillow from a  posterior head injury. CT scan showed a small left frontal bleed, thought  to represent a contrecoup injury. He was admitted to [facility] 02/25/11 and seen in consultation by Dr. Sherley Bounds, neurosurgeon He was followed as an outpatient. After repeat CT scan showed improvement he was allowed to return to the fitness center.  ?  ?Mid February 2013 he had  problems walking without headache. He fell striking his head 05/12/2011 and developed worsening gait with a festinating quality. 05/19/2011, he fell in the  driveway. 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 He underwent craniotomy 3/15 by Dr. Jovita Gamma. He was discharged 06/02/2011.  ?After discharge he noted intermittent tingling in his left hand,face, and arm, lasting  30 seconds, 4 times per day.He had slurred  speech with the episodes. CT scan  06/06/11 showed bilateral subdurals right greater than left of 10 mm with 5 mm right to left midline shift. There was no evidence of new hemorrhage.  ?  ?06/09/2011, he had a witnessed generalized major motor seizure  and was seen in the [facility] with a phenytoin level of approximate 8.Keppra 750 mg twice a day was added on since then. In combination with his Dilantin 100 mg 2 tablets twice a day. 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 No seizures. He underwent scleral buckling OS for retinal detachment by Dr. Zadie Rhine and has left eye cataract to be followed by Dr. Katy Fitch.  Bone density 01/12/2012 was normal ?  ?Update Jul 16, 2020 SS: Here today alone, no seizures, remains on Keppra XR 500 mg, 3 tablets at bedtime. Life is going well. Prostate cancer is undetectable. Had AFIB with thyroidectomy, has seen cardiology, wore heart monitor, there was no further AFIB. 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  ? 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 Remains on Keppra XR 500 mg, 3 tablets at bedtime. Last seizure was almost 10 years ago. Sees cardiology, decided not AFIB. Son lives with him, he drives a car, works out at fitness center. No new issues.  ? ?REVIEW OF SYSTEMS: Out of a complete 14 system review of symptoms, the patient complains only of the following symptoms, and all other reviewed systems are negative. ? ?See HPI ? ?ALLERGIES: ?No Known Allergies ? ?HOME MEDICATIONS: ?Outpatient Medications Prior to Visit  ?Medication Sig Dispense Refill  ? amLODipine-olmesartan (AZOR) 10-40 MG tablet TAKE 1 TABLET BY MOUTH DAILY 90 tablet 1  ? atorvastatin (LIPITOR) 20 MG tablet TAKE 1 TABLET(20 MG) BY MOUTH AT BEDTIME 90 tablet 2  ? levothyroxine (SYNTHROID) 125 MCG tablet Take 1 tablet by mouth daily.    ? metoprolol tartrate (LOPRESSOR) 25 MG tablet Take 12.5 mg by mouth 2 (two) times daily.    ? levETIRAcetam (KEPPRA XR) 500 MG 24 hr tablet Take 3 tablets (1,500 mg total) by mouth at bedtime. 270 tablet 4  ? ?No facility-administered medications prior to visit.  ? ? ?PAST MEDICAL HISTORY: ?Past Medical History:  ?Diagnosis Date  ? Anemia SEVERAL YRS AGO  ? Dr. Paulita Fujita  ? Back pain   ? HNP dx ~2008, no surgery RESOLVED NOW  ? Fall 05-2011   ? had a IC bleed   ? Hearing loss   ? Rt ear sensorineural hearing loss, . W/u by Dr. Remer Macho extensively negative  ? HTN (hypertension)   ? Prostate cancer  ([place]) 2019  ? Seizure disorder ([place])   ? Dx remotely, last SZ activity aprox 1998  ? Subdural hematoma ([place]) 2013  ? Thyroid cancer ([place]) 2020  ? ? ?PAST SURGICAL HISTORY: ?Past Surgical History:  ?Procedure Laterality Date  ? CATARACT EXTRACTION Bilateral   ? R 2014, L 03-2013  ? CRANIOTOMY  05/27/2011  ? Procedure: CRANIOTOMY HEMATOMA EVACUATION SUBDURAL;  Surgeon: Hosie Spangle, MD;  Location: [facility];  Service: Neurosurgery;  Laterality: Right;  Evacuation of Subdural Hematoma  ? CYSTOSCOPY N/A 02/01/2018  ? Procedure: CYSTOSCOPY;  Surgeon: Alexis Frock, MD;  Location: [facility];  Service: Urology;  Laterality: N/A;  no seeds in bladder per Dr Tresa Moore  ? EYE SURGERY  11-2011   ? for retinal detachment , L, Dr Zadie Rhine  ? HERNIA REPAIR    ?  as an infant  ? NECK DISSECTION  12/14/2018  ? PROSTATE BIOPSY  2019  ? RADIOACTIVE SEED IMPLANT N/A 02/01/2018  ? Procedure: RADIOACTIVE SEED IMPLANT/BRACHYTHERAPY IMPLANT;  Surgeon: Alexis Frock, MD;  Location[facility];  Service: Urology;  Laterality: N/A;  58 seeds implanted  ? SPACE OAR INSTILLATION N/A 02/01/2018  ? Procedure: SPACE OAR INSTILLATION;  Surgeon: Alexis Frock, MD;  Location: [facility];  Service: Urology;  Laterality: N/A;  ? STERNOTOMY  12/14/2018  ? THYROIDECTOMY  12/14/2018  ? WISDOM TOOTH EXTRACTION    ? x2, Upper  ? ? ?FAMILY HISTORY: ?Family History  ?Problem Relation Age of Onset  ? Emphysema Mother   ?     smoker  ? Hypertension Father   ?     F and B   ? Leukemia Sister   ? Prostate cancer Brother 27  ? Diabetes Neg Hx   ? Colon cancer Neg Hx   ? Coronary artery disease Neg Hx   ? ? ?SOCIAL HISTORY: ?Social History  ? ?Socioeconomic History  ? Marital status: Single  ?  Spouse name: Not on file  ? Number of children: 2  ? Years of education: PHD  ? Highest education level: Not on file  ?Occupational History  ? Occupation: Retired: Prof [employer]  ?Tobacco Use  ? Smoking status: Never  ?  Smokeless tobacco: Never  ?Vaping Use  ? Vaping Use: Never used  ?Substance and Sexual Activity  ? Alcohol use: No  ?  Alcohol/week: 0.0 standard drinks  ?  Comment: not in  years  ? Drug use: No  ? Sexual activity

## 2021-07-10 ENCOUNTER — Other Ambulatory Visit: Payer: Self-pay | Admitting: Internal Medicine

## 2021-07-19 ENCOUNTER — Ambulatory Visit: Payer: Medicare PPO | Admitting: Neurology

## 2021-08-10 ENCOUNTER — Ambulatory Visit: Payer: Medicare PPO | Admitting: Neurology

## 2021-09-29 ENCOUNTER — Encounter: Payer: Self-pay | Admitting: Internal Medicine

## 2021-09-29 ENCOUNTER — Ambulatory Visit (INDEPENDENT_AMBULATORY_CARE_PROVIDER_SITE_OTHER): Payer: Medicare PPO | Admitting: Internal Medicine

## 2021-09-29 VITALS — BP 124/66 | HR 56 | Temp 98.1°F | Resp 18 | Ht 73.0 in | Wt 217.1 lb

## 2021-09-29 DIAGNOSIS — Z0001 Encounter for general adult medical examination with abnormal findings: Secondary | ICD-10-CM | POA: Diagnosis not present

## 2021-09-29 DIAGNOSIS — I10 Essential (primary) hypertension: Secondary | ICD-10-CM

## 2021-09-29 DIAGNOSIS — D235 Other benign neoplasm of skin of trunk: Secondary | ICD-10-CM | POA: Diagnosis not present

## 2021-09-29 DIAGNOSIS — E785 Hyperlipidemia, unspecified: Secondary | ICD-10-CM | POA: Diagnosis not present

## 2021-09-29 DIAGNOSIS — Z0000 Encounter for general adult medical examination without abnormal findings: Secondary | ICD-10-CM | POA: Diagnosis not present

## 2021-09-29 DIAGNOSIS — G40909 Epilepsy, unspecified, not intractable, without status epilepticus: Secondary | ICD-10-CM

## 2021-09-29 DIAGNOSIS — I480 Paroxysmal atrial fibrillation: Secondary | ICD-10-CM | POA: Diagnosis not present

## 2021-09-29 NOTE — Patient Instructions (Addendum)
Check the  blood pressure regularly BP GOAL is between 110/65 and  135/85. If it is consistently higher or lower, let me know     GO TO THE LAB : Get the blood work     GO TO THE FRONT DESK, PLEASE SCHEDULE YOUR APPOINTMENTS Come back for a physical exam in 1 year    "Living will", "Health Care Power of attorney": Advanced care planning  (If you already have a living will or healthcare power of attorney, please bring the copy to be scanned in your chart.)  Advance care planning is a process that supports adults in  understanding and sharing their preferences regarding future medical care.   The patient's preferences are recorded in documents called Advance Directives.    Advanced directives are completed (and can be modified at any time) while the patient is in full mental capacity.   The documentation should be available at all times to the patient, the family and the healthcare providers.  Bring in a copy to be scanned in your chart is an excellent idea and is recommended   This legal documents direct treatment decision making and/or appoint a surrogate to make the decision if the patient is not capable to do so.    Advance directives can be documented in many types of formats,  documents have names such as:  Lliving will  Durable power of attorney for healthcare (healthcare proxy or healthcare power of attorney)  Combined directives  Physician orders for life-sustaining treatment    More information at:  [email or web address] 

## 2021-09-29 NOTE — Progress Notes (Signed)
Subjective:    Patient ID: Joel [name], male    DOB: [birth year], 76 y.o.   MRN: [id number]  DOS:  09/29/2021 Type of visit - description: CPX  Here for CPX. In general feeling well. Did show me a cyst at the mid back, noted a year ago.  Denies bleeding, pain or discharge.  Review of Systems  Other than above, a 14 point review of systems is negative    Past Medical History:  Diagnosis Date   Anemia SEVERAL YRS AGO   Dr. Paulita Fujita   Back pain    HNP dx ~2008, no surgery RESOLVED NOW   Fall 05-2011    had a IC bleed    Hearing loss    Rt ear sensorineural hearing loss, . W/u by Dr. 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   Thyroid cancer ([place]) 2020    Past Surgical History:  Procedure Laterality Date   CATARACT EXTRACTION Bilateral    R 2014, L 03-2013   CRANIOTOMY  05/27/2011   Procedure: CRANIOTOMY HEMATOMA EVACUATION SUBDURAL;  Surgeon: Hosie Spangle, MD;  Location: [place] [facility];  Service: Neurosurgery;  Laterality: Right;  Evacuation of Subdural Hematoma   CYSTOSCOPY N/A 02/01/2018   Procedure: CYSTOSCOPY;  Surgeon: Alexis Frock, MD;  Location: [facility];  Service: Urology;  Laterality: N/A;  no seeds in bladder per Dr Tresa Moore   EYE SURGERY  [phone]    for retinal detachment , L, Dr Zadie Rhine   HERNIA REPAIR     as an infant   NECK DISSECTION  12/14/2018   PROSTATE BIOPSY  2019   RADIOACTIVE SEED IMPLANT N/A 02/01/2018   Procedure: RADIOACTIVE SEED IMPLANT/BRACHYTHERAPY IMPLANT;  Surgeon: Alexis Frock, MD;  Location: [facility];  Service: Urology;  Laterality: N/A;  58 seeds implanted   SPACE OAR INSTILLATION N/A 02/01/2018   Procedure: SPACE OAR INSTILLATION;  Surgeon: Alexis Frock, MD;  Location: [facility];  Service: Urology;  Laterality: N/A;   STERNOTOMY   12/14/2018   THYROIDECTOMY  12/14/2018   WISDOM TOOTH EXTRACTION     x2, Upper   Social History   Socioeconomic History   Marital status: Single    Spouse name: Not on file   Number of children: 2   Years of education: PHD   Highest education level: Not on file  Occupational History   Occupation: Retired: Prof [employer]  Tobacco Use   Smoking status: Never   Smokeless tobacco: Never  Vaping Use   Vaping Use: Never used  Substance and Sexual Activity   Alcohol use: No    Alcohol/week: 0.0 standard drinks of alcohol    Comment: not in  years   Drug use: No   Sexual activity: Not Currently  Other Topics Concern   Not on file  Social History Narrative   2 children, one in [place] w/ pt   Patient is retired.   Education PHD    Right handed.   Caffeine Three cups of coffee daily.   Social Determinants of Health   Financial Resource Strain: Low Risk  (04/29/2021)   Overall Financial Resource Strain (CARDIA)    Difficulty of Paying Living Expenses: Not hard at all  Food Insecurity: No Food Insecurity (04/29/2021)   Hunger Vital Sign  Worried About Charity fundraiser in the Last Year: Never true    Hobgood in the Last Year: Never true  Transportation Needs: No Transportation Needs (04/29/2021)   PRAPARE - Hydrologist (Medical): No    Lack of Transportation (Non-Medical): No  Physical Activity: Sufficiently Active (04/29/2021)   Exercise Vital Sign    Days of Exercise per Week: 7 days    Minutes of Exercise per Session: 150+ min  Stress: No Stress Concern Present (04/29/2021)   [place]    Feeling of Stress : Not at all  Social Connections: Moderately Isolated (04/29/2021)   Social Connection and Isolation Panel [NHANES]    Frequency of Communication with Friends and Family: Once a week    Frequency of Social Gatherings with Friends and Family: More  than three times a week    Attends Religious Services: Never    Marine scientist or Organizations: Yes    Attends Music therapist: More than 4 times per year    Marital Status: Divorced  Intimate Partner Violence: Not At Risk (04/29/2021)   Humiliation, Afraid, Rape, and Kick questionnaire    Fear of Current or Ex-Partner: No    Emotionally Abused: No    Physically Abused: No    Sexually Abused: No    Current Outpatient Medications  Medication Instructions   amLODipine-olmesartan (AZOR) 10-40 MG tablet 1 tablet, Oral, Daily   atorvastatin (LIPITOR) 20 MG tablet TAKE 1 TABLET(20 MG) BY MOUTH AT BEDTIME   levETIRAcetam (KEPPRA XR) 1,500 mg, Oral, Daily at bedtime   levothyroxine (SYNTHROID) 125 MCG tablet 1 tablet, Oral, Daily   metoprolol tartrate (LOPRESSOR) 12.5 mg, Oral, 2 times daily       Objective:   Physical Exam BP 124/66   Pulse (!) 56   Temp 98.1 F (36.7 C) (Oral)   Resp 18   Ht '6\' 1"'$  (1.854 m)   Wt 217 lb 2 oz (98.5 kg)   SpO2 97%   BMI 28.65 kg/m  General: Well developed, NAD, BMI noted Neck: No  thyromegaly  HEENT:  Normocephalic . Face symmetric, atraumatic Lungs:  CTA B Normal respiratory effort, no intercostal retractions, no accessory muscle use. Heart: RRR,  no murmur.  Abdomen:  Not distended, soft, non-tender. No rebound or rigidity.   Lower extremities: no pretibial edema bilaterally  Skin: At the mid back, he has a lobular lump, approximately 1 x 1 cm in size.  Soft, nontender.  No openings. Neurologic:  alert & oriented X3.  Speech normal, gait appropriate for age and unassisted Strength symmetric and appropriate for age.  Psych: Cognition and judgment appear intact.  Cooperative with normal attention span and concentration.  Behavior appropriate. No anxious or depressed appearing. l    Assessment      Assessment  HTN High chol: rx atorva 09-2020 Paroxysmal A. fib: DX 12-2018, postop.  No anticoagulation or ASA  as of 01/24/2019 Seizure disorder, last activity 05-2011 R hearing loss, h/o ENT eval (-) Intracranial bleed due to fall 2013 Anemia -- Dr. Paulita Fujita Oncology: -Prostate cancer: XRT 2019, rx  lupron -Papillary  thyroid cancer, s/p total thyroidectomy, sternotomy, mediastinal lymph dissection on 12/14/2018 H/o retina detachment  H/o back pain, NPH, no surgery 2008 H/o   moles in the back, saw  Dermatology 2015, told ok, was not rec to schedule a RTC   PLAN Here for CPX [id number] on  Azor, metoprolol.  Reports ambulatory BPs okay.  Check CMP and CBC. Seizure disorder.  Last visit with neurology 06/30/2021, no changes recommended.  Was rec to RTC as needed, I will assume refills of Keppra. High cholesterol: Started atorvastatin last year, very good results, recheck FLP, RF as needed. Paroxysmal A-fib, h/o: saw cardiology last year, had a Zio patch January 2023, show short SVT episodes. Prostate cancer, h/o, sees urology regularly Thyroid cancer, history of, sees Endo regularly. Cyst, back: Refer to plastic surgery for consideration of excisions.   RTC 1 year  In addition to CPX, I addressed his chronic medical problems and reviewed the chart.  Evaluation including a new problem, cyst in the back that required and a referral

## 2021-09-29 NOTE — Assessment & Plan Note (Signed)
Here for CPX [id number] on Azor, metoprolol.  Reports ambulatory BPs okay.  Check CMP and CBC. Seizure disorder.  Last visit with neurology 06/30/2021, no changes recommended.  Was rec to RTC as needed, I will assume refills of Keppra. High cholesterol: Started atorvastatin last year, very good results, recheck FLP, RF as needed. Paroxysmal A-fib, h/o: saw cardiology last year, had a Zio patch January 2023, show short SVT episodes. Prostate cancer, h/o, sees urology regularly Thyroid cancer, history of, sees Endo regularly. Cyst, back: Refer to plastic surgery for consideration of excisions.   RTC 1 year

## 2021-09-29 NOTE — Assessment & Plan Note (Signed)
-  Tdap 2019 - pnm 13: 2016, 2017; PNM 23: 2014 - zostavax :2014 - s/p shingrex   -  covid vax : last shot 01-25-2021, booster recommended  - flu shot rec q year   --CCS: colonoscopy 2003,  cscope   04-2012, tubular adenoma, cscope 06-2015, cscope 12-15-2020 ([facility], no report, pt told 5 years); GI  Dr Paulita Fujita. -- prostate cancer: per urology team --Labs: CMP, FLP, CBC --Continue with healthy lifestyle  -- POA: Discussed

## 2021-09-30 LAB — CBC WITH DIFFERENTIAL/PLATELET
Basophils Absolute: 0 10*3/uL (ref 0.0–0.1)
Basophils Relative: 0.5 % (ref 0.0–3.0)
Eosinophils Absolute: 0.1 10*3/uL (ref 0.0–0.7)
Eosinophils Relative: 1 % (ref 0.0–5.0)
HCT: 42.2 % (ref 39.0–52.0)
Hemoglobin: 13.7 g/dL (ref 13.0–17.0)
Lymphocytes Relative: 16.9 % (ref 12.0–46.0)
Lymphs Abs: 1.3 10*3/uL (ref 0.7–4.0)
MCHC: 32.6 g/dL (ref 30.0–36.0)
MCV: 98.1 fl (ref 78.0–100.0)
Monocytes Absolute: 0.7 10*3/uL (ref 0.1–1.0)
Monocytes Relative: 8.8 % (ref 3.0–12.0)
Neutro Abs: 5.6 10*3/uL (ref 1.4–7.7)
Neutrophils Relative %: 72.8 % (ref 43.0–77.0)
Platelets: 154 10*3/uL (ref 150.0–400.0)
RBC: 4.3 Mil/uL (ref 4.22–5.81)
RDW: 12.9 % (ref 11.5–15.5)
WBC: 7.7 10*3/uL (ref 4.0–10.5)

## 2021-09-30 LAB — LIPID PANEL
Cholesterol: 109 mg/dL (ref 0–200)
HDL: 42.5 mg/dL (ref >39.00)
LDL Cholesterol: 54 mg/dL (ref 0–99)
NonHDL: 66.9
Total CHOL/HDL Ratio: 3
Triglycerides: 63 mg/dL (ref 0.0–149.0)
VLDL: 12.6 mg/dL (ref 0.0–40.0)

## 2021-09-30 LAB — COMPREHENSIVE METABOLIC PANEL
ALT: 18 U/L (ref 0–53)
AST: 26 U/L (ref 0–37)
Albumin: 4.3 g/dL (ref 3.5–5.2)
Alkaline Phosphatase: 76 U/L (ref 39–117)
BUN: 11 mg/dL (ref 6–23)
CO2: 27 mEq/L (ref 19–32)
Calcium: 9.2 mg/dL (ref 8.4–10.5)
Chloride: 105 mEq/L (ref 96–112)
Creatinine, Ser: 1.02 mg/dL (ref 0.40–1.50)
GFR: 72.38 mL/min (ref >60.00)
Glucose, Bld: 88 mg/dL (ref 70–99)
Potassium: 4.5 mEq/L (ref 3.5–5.1)
Sodium: 141 mEq/L (ref 135–145)
Total Bilirubin: 1.1 mg/dL (ref 0.2–1.2)
Total Protein: 7 g/dL (ref 6.0–8.3)

## 2021-11-02 ENCOUNTER — Encounter (INDEPENDENT_AMBULATORY_CARE_PROVIDER_SITE_OTHER): Payer: Medicare PPO | Admitting: Ophthalmology

## 2021-11-08 ENCOUNTER — Ambulatory Visit: Payer: Medicare PPO | Admitting: Plastic Surgery

## 2021-11-08 VITALS — BP 145/78 | HR 57 | Ht 73.0 in | Wt 222.4 lb

## 2021-11-08 DIAGNOSIS — D489 Neoplasm of uncertain behavior, unspecified: Secondary | ICD-10-CM

## 2021-11-08 DIAGNOSIS — D485 Neoplasm of uncertain behavior of skin: Secondary | ICD-10-CM | POA: Diagnosis not present

## 2021-11-08 NOTE — Progress Notes (Addendum)
Referring Provider Colon Branch, MD [address] [place],  [place] [address]   CC:  Cystic lesion back   DENNIES Joel [name] is an 76 y.o. male.  HPI: Patient is a 76 year old with a cystic lesion on his back.  Been present for 1 year.  Painful sometimes with pressure.  It has gotten larger.  He has not seen dermatology but has seen his PCP.    No Known Allergies  Outpatient Encounter Medications as of 11/08/2021  Medication Sig   amLODipine-olmesartan (AZOR) 10-40 MG tablet TAKE 1 TABLET BY MOUTH DAILY   atorvastatin (LIPITOR) 20 MG tablet TAKE 1 TABLET(20 MG) BY MOUTH AT BEDTIME   levETIRAcetam (KEPPRA XR) 500 MG 24 hr tablet Take 3 tablets (1,500 mg total) by mouth at bedtime.   levothyroxine (SYNTHROID) 125 MCG tablet Take 1 tablet by mouth daily.   metoprolol tartrate (LOPRESSOR) 25 MG tablet Take 12.5 mg by mouth 2 (two) times daily.   No facility-administered encounter medications on file as of 11/08/2021.     Past Medical History:  Diagnosis Date   Anemia SEVERAL YRS AGO   Dr. Paulita Fujita   Back pain    HNP dx ~2008, no surgery RESOLVED NOW   Fall 05-2011    had a IC bleed    Hearing loss    Rt ear sensorineural hearing loss, . W/u by Dr. 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   Thyroid cancer ([place]) 2020    Past Surgical History:  Procedure Laterality Date   CATARACT EXTRACTION Bilateral    R 2014, L 03-2013   CRANIOTOMY  05/27/2011   Procedure: CRANIOTOMY HEMATOMA EVACUATION SUBDURAL;  Surgeon: Hosie Spangle, MD;  Location: [place] [facility];  Service: Neurosurgery;  Laterality: Right;  Evacuation of Subdural Hematoma   CYSTOSCOPY N/A 02/01/2018   Procedure: CYSTOSCOPY;  Surgeon: Alexis Frock, MD;  Location[facility];  Service: Urology;  Laterality: N/A;  no seeds in bladder per Dr Tresa Moore    EYE SURGERY  [phone]    for retinal detachment , L, Dr Zadie Rhine   HERNIA REPAIR     as an infant   NECK DISSECTION  12/14/2018   PROSTATE BIOPSY  2019   RADIOACTIVE SEED IMPLANT N/A 02/01/2018   Procedure: RADIOACTIVE SEED IMPLANT/BRACHYTHERAPY IMPLANT;  Surgeon: Alexis Frock, MD;  Location: [facility];  Service: Urology;  Laterality: N/A;  58 seeds implanted   SPACE OAR INSTILLATION N/A 02/01/2018   Procedure: SPACE OAR INSTILLATION;  Surgeon: Alexis Frock, MD;  Location: [facility];  Service: Urology;  Laterality: N/A;   STERNOTOMY  12/14/2018   THYROIDECTOMY  12/14/2018   WISDOM TOOTH EXTRACTION     x2, Upper    Family History  Problem Relation Age of Onset   Emphysema Mother        smoker   Hypertension Father        F and B    Leukemia Sister    Prostate cancer Brother 24   Diabetes Neg Hx    Colon cancer Neg Hx    Coronary artery disease Neg Hx     Social History   Social History Narrative   2 children, one in [place] w/ pt   Patient is retired.  Education PHD    Right handed.   Caffeine Three cups of coffee daily.     Review of Systems General: Joel fevers, chills, weight loss CV: Joel chest pain, shortness of breath, palpitations   Physical Exam    11/08/2021   11:33 AM 09/29/2021   12:44 PM 06/30/2021   12:50 PM  Vitals with BMI  Height '6\' 1"'$  '6\' 1"'$  '6\' 1"'$   Weight 222 lbs 6 oz 217 lbs 2 oz 220 lbs 8 oz  BMI 29.35 31.54 00.8  Systolic 676 195 093  Diastolic 78 66 69  Pulse 57 56 50    General:  No acute distress,  Alert and oriented, Non-Toxic, Normal speech and affect Skin: 1.1 x 0.9 cm nodular lesion on his back.  Assessment/Plan Nodular lesion right back is likely benign but this is painful to patient and excision is indicated.  We will plan this under local in the office.    Lennice Sites 11/08/2021, 1:17 PM

## 2021-11-09 ENCOUNTER — Ambulatory Visit (INDEPENDENT_AMBULATORY_CARE_PROVIDER_SITE_OTHER): Payer: Medicare PPO | Admitting: Ophthalmology

## 2021-11-09 ENCOUNTER — Encounter (INDEPENDENT_AMBULATORY_CARE_PROVIDER_SITE_OTHER): Payer: Self-pay | Admitting: Ophthalmology

## 2021-11-09 DIAGNOSIS — H35352 Cystoid macular degeneration, left eye: Secondary | ICD-10-CM

## 2021-11-09 DIAGNOSIS — H43811 Vitreous degeneration, right eye: Secondary | ICD-10-CM

## 2021-11-09 DIAGNOSIS — Z8669 Personal history of other diseases of the nervous system and sense organs: Secondary | ICD-10-CM

## 2021-11-09 NOTE — Assessment & Plan Note (Signed)
Physiologic, no retinal holes or tears

## 2021-11-09 NOTE — Assessment & Plan Note (Signed)
History of recurrent CME in the past mostly post repair retinal detachment left eye treated in the past with topical medications.  Now stable off medications for years.

## 2021-11-09 NOTE — Progress Notes (Signed)
11/09/2021     CHIEF COMPLAINT Patient presents for No chief complaint on file.     HISTORY OF PRESENT ILLNESS: Joel [name] is a 76 y.o. male who presents to the clinic today for:   HPI   1 year fu ou oct Pt states his vision has been stable Pt denies any new floaters or FOL  Last edited by Morene Rankins, CMA on 11/09/2021  3:19 PM.      Referring physician: Colon Branch, MD [address] [place],  [place] [address]  HISTORICAL INFORMATION:   Selected notes from the [facility]: No current outpatient medications on file. (Ophthalmic Drugs)   No current facility-administered medications for this visit. (Ophthalmic Drugs)   Current Outpatient Medications (Other)  Medication Sig   amLODipine-olmesartan (AZOR) 10-40 MG tablet TAKE 1 TABLET BY MOUTH DAILY   atorvastatin (LIPITOR) 20 MG tablet TAKE 1 TABLET(20 MG) BY MOUTH AT BEDTIME   levETIRAcetam (KEPPRA XR) 500 MG 24 hr tablet Take 3 tablets (1,500 mg total) by mouth at bedtime.   levothyroxine (SYNTHROID) 125 MCG tablet Take 1 tablet by mouth daily.   metoprolol tartrate (LOPRESSOR) 25 MG tablet Take 12.5 mg by mouth 2 (two) times daily.   No current facility-administered medications for this visit. (Other)      REVIEW OF SYSTEMS: ROS   Negative for: Constitutional, Gastrointestinal, Neurological, Skin, Genitourinary, Musculoskeletal, HENT, Endocrine, Cardiovascular, Eyes, Respiratory, Psychiatric, Allergic/Imm, Heme/Lymph Last edited by Orene Desanctis D, CMA on 11/09/2021  3:19 PM.       ALLERGIES No Known Allergies  PAST MEDICAL HISTORY Past Medical History:  Diagnosis Date   Anemia SEVERAL YRS AGO   Dr. Paulita Fujita   Back pain    HNP dx ~2008, no surgery RESOLVED NOW   Fall 05-2011    had a IC bleed    Hearing loss    Rt ear sensorineural hearing loss, . W/u by Dr. 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   Thyroid cancer ([place]) 2020   Past Surgical History:  Procedure Laterality Date   CATARACT EXTRACTION Bilateral    R 2014, L 03-2013   CRANIOTOMY  05/27/2011   Procedure: CRANIOTOMY HEMATOMA EVACUATION SUBDURAL;  Surgeon: Hosie Spangle, MD;  Location: [place] [facility];  Service: Neurosurgery;  Laterality: Right;  Evacuation of Subdural Hematoma   CYSTOSCOPY N/A 02/01/2018   Procedure: CYSTOSCOPY;  Surgeon: Alexis Frock, MD;  Location: [facility];  Service: Urology;  Laterality: N/A;  no seeds in bladder per Dr Tresa Moore   EYE SURGERY  [phone]    for retinal detachment , L, Dr Zadie Rhine   HERNIA REPAIR     as an infant   NECK DISSECTION  12/14/2018   PROSTATE BIOPSY  2019   RADIOACTIVE SEED IMPLANT N/A 02/01/2018   Procedure: RADIOACTIVE SEED IMPLANT/BRACHYTHERAPY IMPLANT;  Surgeon: Alexis Frock, MD;  Location: [facility];  Service: Urology;  Laterality: N/A;  58 seeds implanted   SPACE OAR INSTILLATION N/A 02/01/2018   Procedure: SPACE OAR INSTILLATION;  Surgeon: Alexis Frock, MD;  Location: [facility];  Service: Urology;  Laterality: N/A;   STERNOTOMY  12/14/2018   THYROIDECTOMY  12/14/2018   WISDOM TOOTH EXTRACTION  x2, Upper    FAMILY HISTORY Family History  Problem Relation Age of Onset   Emphysema Mother        smoker   Hypertension Father        F and B    Leukemia Sister    Prostate cancer Brother 44   Diabetes Neg Hx    Colon cancer Neg Hx    Coronary artery disease Neg Hx     SOCIAL HISTORY Social History   Tobacco Use   Smoking status: Never   Smokeless tobacco: Never  Vaping Use   Vaping Use: Never used  Substance Use Topics   Alcohol use: No    Alcohol/week: 0.0 standard drinks of alcohol    Comment: not in  years   Drug use: No         OPHTHALMIC EXAM:  Base Eye Exam     Visual Acuity  (ETDRS)       Right Left   Dist [place] 20/20 20/30 +3         Tonometry (Tonopen, 3:26 PM)       Right Left   Pressure 12 8         Pupils       Pupils   Right PERRL   Left PERRL         Visual Fields       Left Right    Full Full         Extraocular Movement       Right Left    Ortho Ortho    -- -- --  --  --  -- -- --   -- -- --  --  --  -- -- --           Neuro/Psych     Oriented x3: Yes   Mood/Affect: Normal         Dilation     Both eyes: 1.0% Mydriacyl, 2.5% Phenylephrine @ 3:21 PM           Slit Lamp and Fundus Exam     External Exam       Right Left   External Normal Normal         Slit Lamp Exam       Right Left   Lids/Lashes Normal Normal   Conjunctiva/Sclera White and quiet White and quiet   Cornea Clear Clear   Anterior Chamber Deep and quiet Deep and quiet   Iris Round and reactive Round and reactive, TI defect at 730 meridian, could represent iris chafe from IOL   Lens Centered posterior chamber intraocular lens Centered posterior chamber intraocular lens, Open posterior capsule   Anterior Vitreous Normal Normal         Fundus Exam       Right Left   Posterior Vitreous Posterior vitreous detachment, Central vitreous floaters Vitrectomized   Disc Normal Normal   C/D Ratio 0.65 0.55   Macula Normal Normal   Vessels Normal Normal   Periphery Normal, no holes or tears Good chorioretinal scar, retinopexy and intact buckle, no breaks            IMAGING AND PROCEDURES  Imaging and Procedures for 11/09/21  OCT, Retina - OU - Both Eyes       Right Eye Quality was good. Scan locations included subfoveal. Central Foveal Thickness: 260. Progression has been stable. Findings include normal observations.   Left Eye Quality was good. Scan locations included subfoveal. Central Foveal Thickness: 248. Progression  has been stable. Findings include normal foveal contour.   Notes PVD OD  OS No CME              ASSESSMENT/PLAN:  Cystoid macular edema of left eye History of recurrent CME in the past mostly post repair retinal detachment left eye treated in the past with topical medications.  Now stable off medications for years.    Posterior vitreous detachment of right eye Physiologic, no retinal holes or tears  History of retinal detachment Doing well now some 10 years post repair via scleral buckle     ICD-10-CM   1. Posterior vitreous detachment of right eye  H43.811 OCT, Retina - OU - Both Eyes    2. Cystoid macular edema of left eye  H35.352     3. History of retinal detachment  Z86.69       1.  OD stable, no retinal holes or tears  2.  OS stable no sign of recurrent CME  3.  OS no holes or tears  Ophthalmic Meds Ordered this visit:  No orders of the defined types were placed in this encounter.      Return in about 1 year (around 11/10/2022) for DILATE OU, COLOR FP, OCT.  There are no Patient Instructions on file for this visit.   Explained the diagnoses, plan, and follow up with the patient and they expressed understanding.  Patient expressed understanding of the importance of proper follow up care.   Clent Demark Tymeir Weathington M.D. Diseases & Surgery of the Retina and Vitreous Retina [facility] Diabetic [place] 11/09/21     Abbreviations: M myopia (nearsighted); A astigmatism; H hyperopia (farsighted); P presbyopia; Mrx spectacle prescription;  CTL contact lenses; OD right eye; OS left eye; OU both eyes  XT exotropia; ET esotropia; PEK punctate epithelial keratitis; PEE punctate epithelial erosions; DES dry eye syndrome; MGD meibomian gland dysfunction; ATs artificial tears; PFAT's preservative free artificial tears; [place] nuclear sclerotic cataract; PSC posterior subcapsular cataract; ERM epi-retinal membrane; PVD posterior vitreous detachment; RD retinal detachment; DM diabetes mellitus; DR diabetic retinopathy; NPDR non-proliferative diabetic retinopathy; PDR proliferative  diabetic retinopathy; CSME clinically significant macular edema; DME diabetic macular edema; dbh dot blot hemorrhages; CWS cotton wool spot; POAG primary open angle glaucoma; C/D cup-to-disc ratio; HVF humphrey visual field; GVF goldmann visual field; OCT optical coherence tomography; IOP intraocular pressure; BRVO Branch retinal vein occlusion; CRVO central retinal vein occlusion; CRAO central retinal artery occlusion; BRAO branch retinal artery occlusion; RT retinal tear; SB scleral buckle; PPV pars plana vitrectomy; VH Vitreous hemorrhage; PRP panretinal laser photocoagulation; IVK intravitreal kenalog; VMT vitreomacular traction; MH Macular hole;  NVD neovascularization of the disc; NVE neovascularization elsewhere; AREDS age related eye disease study; ARMD age related macular degeneration; POAG primary open angle glaucoma; EBMD epithelial/anterior basement membrane dystrophy; ACIOL anterior chamber intraocular lens; IOL intraocular lens; PCIOL posterior chamber intraocular lens; Phaco/IOL phacoemulsification with intraocular lens placement; [place] photorefractive keratectomy; LASIK laser assisted in situ keratomileusis; HTN hypertension; DM diabetes mellitus; COPD chronic obstructive pulmonary disease

## 2021-11-09 NOTE — Assessment & Plan Note (Signed)
Doing well now some 10 years post repair via scleral buckle

## 2021-12-16 ENCOUNTER — Ambulatory Visit: Payer: Medicare PPO | Admitting: Plastic Surgery

## 2021-12-16 VITALS — BP 126/81 | HR 84 | Ht 73.0 in | Wt 222.0 lb

## 2021-12-16 DIAGNOSIS — D489 Neoplasm of uncertain behavior, unspecified: Secondary | ICD-10-CM

## 2021-12-16 DIAGNOSIS — L7211 Pilar cyst: Secondary | ICD-10-CM | POA: Diagnosis not present

## 2021-12-16 DIAGNOSIS — D485 Neoplasm of uncertain behavior of skin: Secondary | ICD-10-CM

## 2021-12-16 NOTE — Progress Notes (Signed)
Operative Note   DATE OF OPERATION: 12/16/2021  LOCATION:    SURGICAL DEPARTMENT: Plastic Surgery  PREOPERATIVE DIAGNOSES:  right back nodular lesion  POSTOPERATIVE DIAGNOSES:  same  PROCEDURE:  Excision of right back lesion measuring 2 cm Right back closure measuring 3 cm  SURGEON: Dagny Fiorentino P. Alleene Stoy, MD  ANESTHESIA:  Local  COMPLICATIONS: None.   INDICATIONS FOR PROCEDURE:  The patient, Joel [name] is a 76 y.o. male born on [birth year], is here for treatment of right back nodule. MRN: [id number]  CONSENT:  Informed consent was obtained directly from the patient. Risks, benefits and alternatives were fully discussed. Specific risks including but not limited to bleeding, infection, hematoma, seroma, scarring, pain, infection, wound healing problems, and need for further surgery were all discussed. The patient did have an ample opportunity to have questions answered to satisfaction.   DESCRIPTION OF PROCEDURE:  Local anesthesia was administered. The patient's operative site was prepped and draped in a sterile fashion. A time out was performed and all information was confirmed to be correct.  The lesion was excised with a 15 blade.  Hemostasis was obtained.  Circumferential undermining was performed and the skin was advanced and closed in layers with interrupted buried PDS sutures and Prolene for the skin.  The excision measured 2 cm, and the total length of closure measured 3 cm.    The patient tolerated the procedure well.  There were no complications.

## 2021-12-31 ENCOUNTER — Ambulatory Visit: Payer: Medicare PPO | Admitting: Physician Assistant

## 2021-12-31 ENCOUNTER — Encounter: Payer: Self-pay | Admitting: Physician Assistant

## 2021-12-31 DIAGNOSIS — L7211 Pilar cyst: Secondary | ICD-10-CM

## 2021-12-31 NOTE — Progress Notes (Signed)
Patient is a 76 year old male with PMH of right back nodular lesion s/p excision performed 12/16/2021 Dr. Erin Hearing who presents to clinic for postprocedural follow-up.    Reviewed procedural note and the 3 cm lesion excision site was closed using buried PDS sutures and Prolene for the skin.  Reviewed pathology and it is consistent with a pilar cyst.  Today, patient is doing well.  Denies any complaints.  States that is been healing fine.  Mildly itchy.  He has not applied anything to the excision site.  Physical exam is entirely reassuring.  Excision site is well approximated.  Running Prolene suture is removed without complication or difficulty.  Mild peri-incisional erythema.  No induration.  Nontender.  Suspect that patient's mild peri-incisional erythema is due to dryness and perhaps irritation from the sutures.  Applied a thin film of Vaseline.  Recommend continued Vaseline as needed.  Discussed pathology, patient voices understanding.  No specific follow-up needed.  He can call the clinic should he have any additional questions or concerns.  Picture(s) obtained of the patient and placed in the chart were with the patient's or guardian's permission.

## 2022-01-03 ENCOUNTER — Other Ambulatory Visit: Payer: Self-pay | Admitting: Internal Medicine

## 2022-01-28 ENCOUNTER — Other Ambulatory Visit: Payer: Self-pay | Admitting: Internal Medicine

## 2022-02-01 DIAGNOSIS — I491 Atrial premature depolarization: Secondary | ICD-10-CM | POA: Diagnosis not present

## 2022-02-01 DIAGNOSIS — I480 Paroxysmal atrial fibrillation: Secondary | ICD-10-CM | POA: Diagnosis not present

## 2022-02-02 DIAGNOSIS — I491 Atrial premature depolarization: Secondary | ICD-10-CM | POA: Diagnosis not present

## 2022-02-02 DIAGNOSIS — I480 Paroxysmal atrial fibrillation: Secondary | ICD-10-CM | POA: Diagnosis not present

## 2022-02-28 DIAGNOSIS — E890 Postprocedural hypothyroidism: Secondary | ICD-10-CM | POA: Diagnosis not present

## 2022-02-28 DIAGNOSIS — Z85850 Personal history of malignant neoplasm of thyroid: Secondary | ICD-10-CM | POA: Diagnosis not present

## 2022-02-28 DIAGNOSIS — C73 Malignant neoplasm of thyroid gland: Secondary | ICD-10-CM | POA: Diagnosis not present

## 2022-02-28 DIAGNOSIS — Z79890 Hormone replacement therapy (postmenopausal): Secondary | ICD-10-CM | POA: Diagnosis not present

## 2022-05-02 ENCOUNTER — Ambulatory Visit (INDEPENDENT_AMBULATORY_CARE_PROVIDER_SITE_OTHER): Payer: Medicare PPO | Admitting: *Deleted

## 2022-05-02 VITALS — BP 127/71 | HR 84 | Ht 73.0 in | Wt 213.2 lb

## 2022-05-02 DIAGNOSIS — Z0000 Encounter for general adult medical examination without abnormal findings: Secondary | ICD-10-CM

## 2022-05-02 NOTE — Patient Instructions (Signed)
[name] , Thank you for taking time to come for your Medicare Wellness Visit. I appreciate your ongoing commitment to your health goals. Please review the following plan we discussed and let me know if I can assist you in the future.   These are the goals we discussed:  Goals      Patient Stated     Maintain current health & level of activity        This is a list of the screening recommended for you and due dates:  Health Maintenance  Topic Date Due   COVID-19 Vaccine (6 - 2023-24 season) 11/12/2021   Medicare Annual Wellness Visit  05/03/2023   Colon Cancer Screening  12/28/2025   DTaP/Tdap/Td vaccine (3 - Td or Tdap) 07/24/2027   Pneumonia Vaccine  Completed   Flu Shot  Completed   Hepatitis C Screening: USPSTF Recommendation to screen - Ages 18-79 yo.  Completed   Zoster (Shingles) Vaccine  Completed   HPV Vaccine  Aged Out     Next appointment: Follow up in one year for your annual wellness visit.   Preventive Care 75 Years and Older, Male Preventive care refers to lifestyle choices and visits with your health care provider that can promote health and wellness. What does preventive care include? A yearly physical exam. This is also called an annual well check. Dental exams once or twice a year. Routine eye exams. Ask your health care provider how often you should have your eyes checked. Personal lifestyle choices, including: Daily care of your teeth and gums. Regular physical activity. Eating a healthy diet. Avoiding tobacco and drug use. Limiting alcohol use. Practicing safe sex. Taking low doses of aspirin every day. Taking vitamin and mineral supplements as recommended by your health care provider. What happens during an annual well check? The services and screenings done by your health care provider during your annual well check will depend on your age, overall health, lifestyle risk factors, and family history of disease. Counseling  Your health care  provider may ask you questions about your: Alcohol use. Tobacco use. Drug use. Emotional well-being. Home and relationship well-being. Sexual activity. Eating habits. History of falls. Memory and ability to understand (cognition). Work and work Statistician. Screening  You may have the following tests or measurements: Height, weight, and BMI. Blood pressure. Lipid and cholesterol levels. These may be checked every 5 years, or more frequently if you are over 77 years old. Skin check. Lung cancer screening. You may have this screening every year starting at age 34 if you have a 30-pack-year history of smoking and currently smoke or have quit within the past 15 years. Fecal occult blood test (FOBT) of the stool. You may have this test every year starting at age 70. Flexible sigmoidoscopy or colonoscopy. You may have a sigmoidoscopy every 5 years or a colonoscopy every 10 years starting at age 61. Prostate cancer screening. Recommendations will vary depending on your family history and other risks. Hepatitis C blood test. Hepatitis B blood test. Sexually transmitted disease (STD) testing. Diabetes screening. This is done by checking your blood sugar (glucose) after you have not eaten for a while (fasting). You may have this done every 1-3 years. Abdominal aortic aneurysm (AAA) screening. You may need this if you are a current or former smoker. Osteoporosis. You may be screened starting at age 6 if you are at high risk. Talk with your health care provider about your test results, treatment options, and if necessary, the  need for more tests. Vaccines  Your health care provider may recommend certain vaccines, such as: Influenza vaccine. This is recommended every year. Tetanus, diphtheria, and acellular pertussis (Tdap, Td) vaccine. You may need a Td booster every 10 years. Zoster vaccine. You may need this after age 41. Pneumococcal 13-valent conjugate (PCV13) vaccine. One dose is  recommended after age 7. Pneumococcal polysaccharide (PPSV23) vaccine. One dose is recommended after age 31. Talk to your health care provider about which screenings and vaccines you need and how often you need them. This information is not intended to replace advice given to you by your health care provider. Make sure you discuss any questions you have with your health care provider. Document Released: 03/27/2015 Document Revised: 11/18/2015 Document Reviewed: 12/30/2014 Elsevier Interactive Patient Education  2017 [place] Prevention in the Home Falls can cause injuries. They can happen to people of all ages. There are many things you can do to make your home safe and to help prevent falls. What can I do on the outside of my home? Regularly fix the edges of walkways and driveways and fix any cracks. Remove anything that might make you trip as you walk through a door, such as a raised step or threshold. Trim any bushes or trees on the path to your home. Use bright outdoor lighting. Clear any walking paths of anything that might make someone trip, such as rocks or tools. Regularly check to see if handrails are loose or broken. Make sure that both sides of any steps have handrails. Any raised decks and porches should have guardrails on the edges. Have any leaves, snow, or ice cleared regularly. Use sand or salt on walking paths during winter. Clean up any spills in your garage right away. This includes oil or grease spills. What can I do in the bathroom? Use night lights. Install grab bars by the toilet and in the tub and shower. Do not use towel bars as grab bars. Use non-skid mats or decals in the tub or shower. If you need to sit down in the shower, use a plastic, non-slip stool. Keep the floor dry. Clean up any water that spills on the floor as soon as it happens. Remove soap buildup in the tub or shower regularly. Attach bath mats securely with double-sided non-slip rug  tape. Do not have throw rugs and other things on the floor that can make you trip. What can I do in the bedroom? Use night lights. Make sure that you have a light by your bed that is easy to reach. Do not use any sheets or blankets that are too big for your bed. They should not hang down onto the floor. Have a firm chair that has side arms. You can use this for support while you get dressed. Do not have throw rugs and other things on the floor that can make you trip. What can I do in the kitchen? Clean up any spills right away. Avoid walking on wet floors. Keep items that you use a lot in easy-to-reach places. If you need to reach something above you, use a strong step stool that has a grab bar. Keep electrical cords out of the way. Do not use floor polish or wax that makes floors slippery. If you must use wax, use non-skid floor wax. Do not have throw rugs and other things on the floor that can make you trip. What can I do with my stairs? Do not leave any items on the  stairs. Make sure that there are handrails on both sides of the stairs and use them. Fix handrails that are broken or loose. Make sure that handrails are as long as the stairways. Check any carpeting to make sure that it is firmly attached to the stairs. Fix any carpet that is loose or worn. Avoid having throw rugs at the top or bottom of the stairs. If you do have throw rugs, attach them to the floor with carpet tape. Make sure that you have a light switch at the top of the stairs and the bottom of the stairs. If you do not have them, ask someone to add them for you. What else can I do to help prevent falls? Wear shoes that: Do not have high heels. Have rubber bottoms. Are comfortable and fit you well. Are closed at the toe. Do not wear sandals. If you use a stepladder: Make sure that it is fully opened. Do not climb a closed stepladder. Make sure that both sides of the stepladder are locked into place. Ask someone to  hold it for you, if possible. Clearly mark and make sure that you can see: Any grab bars or handrails. First and last steps. Where the edge of each step is. Use tools that help you move around (mobility aids) if they are needed. These include: Canes. Walkers. Scooters. Crutches. Turn on the lights when you go into a dark area. Replace any light bulbs as soon as they burn out. Set up your furniture so you have a clear path. Avoid moving your furniture around. If any of your floors are uneven, fix them. If there are any pets around you, be aware of where they are. Review your medicines with your doctor. Some medicines can make you feel dizzy. This can increase your chance of falling. Ask your doctor what other things that you can do to help prevent falls. This information is not intended to replace advice given to you by your health care provider. Make sure you discuss any questions you have with your health care provider. Document Released: 12/25/2008 Document Revised: 08/06/2015 Document Reviewed: 04/04/2014 Elsevier Interactive Patient Education  2017 [employer].

## 2022-05-02 NOTE — Progress Notes (Signed)
Subjective:  Pt completed ADLs, Fall risk, & SDOH during e-check in on 04/27/22.  Answers verified with pt.    Joel [name] is a 77 y.o. male who presents for Medicare Annual/Subsequent preventive examination.  Review of Systems    Defer to PCP Cardiac Risk Factors include: advanced age (>45mn, >>68women);male gender;hypertension     Objective:    Today's Vitals   05/02/22 1511  BP: 127/71  Pulse: 84  Weight: 213 lb 3.2 oz (96.7 kg)  Height: 6' 1"$  (1.854 m)   Body mass index is 28.13 kg/m.     05/02/2022    3:12 PM 04/29/2021    3:07 PM 04/23/2020    3:27 PM 02/14/2019    3:09 PM 05/10/2018    2:34 PM 02/01/2018    8:17 AM 07/21/2017    2:14 PM  Advanced Directives  Does Patient Have a Medical Advance Directive? No No No No No No No  Would patient like information on creating a medical advance directive? No - Patient declined No - Patient declined Yes (MAU/Ambulatory/Procedural Areas - Information given) No - Patient declined No - Patient declined No - Patient declined Yes (MAU/Ambulatory/Procedural Areas - Information given)    Current Medications (verified) Outpatient Encounter Medications as of 05/02/2022  Medication Sig   Multiple Vitamins-Iron (MULTIVITAMIN/IRON PO) Take by mouth.   amLODipine-olmesartan (AZOR) 10-40 MG tablet Take 1 tablet by mouth daily.   atorvastatin (LIPITOR) 20 MG tablet Take 1 tablet (20 mg total) by mouth at bedtime.   levETIRAcetam (KEPPRA XR) 500 MG 24 hr tablet Take 3 tablets (1,500 mg total) by mouth at bedtime.   levothyroxine (SYNTHROID) 125 MCG tablet Take 1 tablet by mouth daily.   metoprolol tartrate (LOPRESSOR) 25 MG tablet Take 12.5 mg by mouth 2 (two) times daily.   No facility-administered encounter medications on file as of 05/02/2022.    Allergies (verified) Patient has no known allergies.   History: Past Medical History:  Diagnosis Date   Anemia SEVERAL YRS AGO   Dr. 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 W/u by Dr. SRemer Machoextensively negative   HTN (hypertension)    [place]   [facility]    Dx remotely, last SZ activity aprox 1998   [place] hematoma [place]   [place]   Past Surgical History:  Procedure Laterality Date   CATARACT EXTRACTION Bilateral    R 2014, L 03-2013   CRANIOTOMY  05/27/2011   Procedure: CRANIOTOMY HEMATOMA EVACUATION SUBDURAL;  Surgeon: RHosie Spangle MD;  [facility];  Service: Neurosurgery;  Laterality: Right;  Evacuation of Subdural Hematoma   CYSTOSCOPY N/A 02/01/2018   Procedure: CYSTOSCOPY;  Surgeon: MAlexis Frock MD;  [facility]  Service: Urology;  Laterality: N/A;  no seeds in bladder per Dr MTresa Moore  EYE SURGERY  [phone]   for retinal detachment , L, Dr RZadie Rhine  HERNIA REPAIR     as an infant   NECK DISSECTION  12/14/2018   PROSTATE BIOPSY  2019   RADIOACTIVE SEED IMPLANT N/A 02/01/2018   Procedure: RADIOACTIVE SEED IMPLANT/BRACHYTHERAPY IMPLANT;  Surgeon: MAlexis Frock MD;  [facility]  Service: Urology;  Laterality: N/A;  58 seeds implanted  SPACE OAR INSTILLATION N/A 02/01/2018   Procedure: SPACE OAR INSTILLATION;  Surgeon: Alexis Frock, MD;  Location[facility];  Service: Urology;  Laterality: N/A;   STERNOTOMY  12/14/2018   THYROIDECTOMY  12/14/2018   WISDOM TOOTH EXTRACTION     x2, Upper   Family History  Problem Relation Age of Onset   Emphysema Mother        smoker   Hypertension Father        F and B    Leukemia Sister    Prostate cancer Brother 29   Diabetes Neg Hx    Colon cancer Neg Hx    Coronary artery disease Neg Hx    Social History   Socioeconomic History   Marital status: Single    Spouse name: Not on file   Number of children: 2   Years of education: PHD   Highest  education level: Not on file  Occupational History   Occupation: Retired: Prof [employer]  Tobacco Use   Smoking status: Never   Smokeless tobacco: Never  Vaping Use   Vaping Use: Never used  Substance and Sexual Activity   Alcohol use: No    Alcohol/week: 0.0 standard drinks of alcohol    Comment: not in  years   Drug use: No   Sexual activity: Not Currently  Other Topics Concern   Not on file  Social History Narrative   2 children, one in [place] w/ pt   Patient is retired.   Education PHD    Right handed.   Caffeine Three cups of coffee daily.   Social Determinants of Health   Financial Resource Strain: Low Risk  (04/27/2022)   Overall Financial Resource Strain (CARDIA)    Difficulty of Paying Living Expenses: Not hard at all  Food Insecurity: No Food Insecurity (04/27/2022)   Hunger Vital Sign    Worried About Running Out of Food in the Last Year: Never true    Ran Out of Food in the Last Year: Never true  Transportation Needs: No Transportation Needs (04/27/2022)   PRAPARE - Hydrologist (Medical): No    Lack of Transportation (Non-Medical): No  Physical Activity: Sufficiently Active (04/27/2022)   Exercise Vital Sign    Days of Exercise per Week: 7 days    Minutes of Exercise per Session: 150+ min  Stress: No Stress Concern Present (04/27/2022)   [place]    Feeling of Stress : Not at all  Social Connections: Socially Isolated (04/27/2022)   Social Connection and Isolation Panel [NHANES]    Frequency of Communication with Friends and Family: Once a week    Frequency of Social Gatherings with Friends and Family: More than three times a week    Attends Religious Services: Never    Marine scientist or Organizations: No    Attends Music therapist: Never    Marital Status: Divorced    Tobacco Counseling Counseling given: Not  Answered   Clinical Intake:  Pre-visit preparation completed: Yes  Pain : No/denies pain  Diabetes: No  How often do you need to have someone help you when you read instructions, pamphlets, or other written materials from your doctor or pharmacy?: 1 - Never  Activities of Daily Living    04/27/2022   11:24 AM  In your present state of health, do you have any difficulty performing the following  activities:  Hearing? 0  Vision? 0  Difficulty concentrating or making decisions? 0  Walking or climbing stairs? 0  Dressing or bathing? 0  Doing errands, shopping? 0  Preparing Food and eating ? N  Using the Toilet? N  In the past six months, have you accidently leaked urine? N  Do you have problems with loss of bowel control? N  Managing your Medications? N  Managing your Finances? N  Housekeeping or managing your Housekeeping? N    Patient Care Team: Colon Branch, MD as PCP - General Sharyne Peach, MD as Consulting Physician (Ophthalmology) Zadie Rhine Clent Demark, MD as Consulting Physician (Ophthalmology) Marcial Pacas, MD as Consulting Physician (Neurology) Arta Silence, MD as Consulting Physician (Gastroenterology) Alexis Frock, MD as Consulting Physician (Urology) Jerrell Belfast, MD as Consulting Physician (Otolaryngology) Francina Ames, MD as Referring Physician (Otolaryngology) Moss Mc, MD as Referring Physician (Cardiology) Kathlene Cote, MD as Referring Physician (Endocrinology)  Indicate any recent Medical Services you may have received from other than [facility] providers in the past year (date may be approximate).     Assessment:   This is a routine wellness examination for [place].  Hearing/Vision screen No results found.  Dietary issues and exercise activities discussed: Current Exercise Habits: Home exercise routine;Structured exercise class, Type of exercise: calisthenics;Other - see comments;walking;strength training/weights (Silver  Sneakers), Time (Minutes): > 60, Frequency (Times/Week): 7, Weekly Exercise (Minutes/Week): 0, Intensity: Moderate, Exercise limited by: None identified   Goals Addressed   None    Depression Screen    09/29/2021   12:50 PM 04/29/2021    3:09 PM 09/28/2020    1:05 PM 04/23/2020    3:30 PM 02/14/2019    3:19 PM 01/24/2019   10:37 AM 09/28/2017    3:28 PM  PHQ 2/9 Scores  PHQ - 2 Score 0 0 0 0 0 0 0    Fall Risk    04/27/2022   11:24 AM 09/29/2021   12:50 PM 04/29/2021    3:09 PM 09/28/2020    1:05 PM 04/23/2020    3:29 PM  [place] in the past year? 0 0 0 0 0  Number falls in past yr: 0 0 0 0 0  Injury with Fall? 0 0 0 0 0  Risk for fall due to : No Fall Risks      Follow up Falls evaluation completed Falls evaluation completed Falls prevention discussed  Falls prevention discussed    FALL RISK PREVENTION PERTAINING TO THE HOME:  Any stairs in or around the home? No  Home free of loose throw rugs in walkways, pet beds, electrical cords, etc? Yes  Adequate lighting in your home to reduce risk of falls? Yes   ASSISTIVE DEVICES UTILIZED TO PREVENT FALLS:  Life alert? No  Use of a cane, walker or w/c? No  Grab bars in the bathroom? Yes  Shower chair or bench in shower? No  Elevated toilet seat or a handicapped toilet?  Comfort height  TIMED UP AND GO:  Was the test performed? Yes .  Length of time to ambulate 10 feet: 5 sec.   Gait steady and fast without use of assistive device  Cognitive Function:    07/11/2016    2:36 PM  MMSE - Mini Mental State Exam  Orientation to time 5  Orientation to Place 5  Registration 3  Attention/ Calculation 5  Recall 3  Language- name 2 objects 2  Language- repeat 1  Language- follow 3  step command 3  Language- read & follow direction 1  Write a sentence 1  Copy design 1  Total score 30        05/02/2022    3:22 PM  6CIT Screen  What Year? 0 points  What month? 0 points  What time? 0 points  Count back from 20 0  points  Months in reverse 0 points  Repeat phrase 0 points  Total Score 0 points    Immunizations Immunization History  Administered Date(s) Administered   Fluad Quad(high Dose 65+) 11/22/2018, 03/03/2020   Influenza Whole 02/12/2007, 01/09/2008, 12/24/2008, 12/17/2009, 01/20/2013   Influenza, High Dose Seasonal PF 01/12/2018, 03/03/2020   Influenza-Unspecified 10/31/2015, 01/10/2017, 12/12/2020   PFIZER Comirnaty(Gray Top)Covid-19 Tri-Sucrose Vaccine 01/11/2020   PFIZER(Purple Top)SARS-COV-2 Vaccination 05/05/2019, 05/29/2019, 01/11/2020, 06/17/2020   Pneumococcal Conjugate-13 05/30/2014, 10/31/2015   Pneumococcal Polysaccharide-23 04/03/2012   Td 05/16/2007   Tdap 07/23/2017   Zoster Recombinat (Shingrix) 09/29/2020, 12/01/2020   Zoster, Live 05/04/2012    TDAP status: Up to date  Flu Vaccine status: Up to date  Pneumococcal vaccine status: Up to date  Covid-19 vaccine status: Information provided on how to obtain vaccines.   Qualifies for Shingles Vaccine? Yes   Zostavax completed Yes   Shingrix Completed?: Yes  Screening Tests Health Maintenance  Topic Date Due   COVID-19 Vaccine (6 - 2023-24 season) 11/12/2021   Medicare Annual Wellness (AWV)  04/29/2022   COLONOSCOPY (Pts 45-44yr Insurance coverage will need to be confirmed)  12/28/2025   DTaP/Tdap/Td (3 - Td or Tdap) 07/24/2027   Pneumonia Vaccine 75 Years old  Completed   INFLUENZA VACCINE  Completed   Hepatitis C Screening  Completed   Zoster Vaccines- Shingrix  Completed   HPV VACCINES  Aged Out    Health Maintenance  Health Maintenance Due  Topic Date Due   COVID-19 Vaccine (6 - 2023-24 season) 11/12/2021   Medicare Annual Wellness (AWV)  04/29/2022    Colorectal cancer screening: Type of screening: Colonoscopy. Completed 12/28/20. Repeat every 5 years  Lung Cancer Screening: (Low Dose CT Chest recommended if Age 75-80years, 30 pack-year currently smoking OR have quit w/in 15years.) does not  qualify.   Additional Screening:  Hepatitis C Screening: does qualify; Completed 07/11/16  Vision Screening: Recommended annual ophthalmology exams for early detection of glaucoma and other disorders of the eye. Is the patient up to date with their annual eye exam?  Yes  Who is the provider or what is the name of the office in which the patient attends annual eye exams? Dr. GDeloria LairIf pt is not established with a provider, would they like to be referred to a provider to establish care? No .   Dental Screening: Recommended annual dental exams for proper oral hygiene  Community Resource Referral / Chronic Care Management: CRR required this visit?  No   CCM required this visit?  No      Plan:     I have personally reviewed and noted the following in the patient's chart:   Medical and social history Use of alcohol, tobacco or illicit drugs  Current medications and supplements including opioid prescriptions. Patient is not currently taking opioid prescriptions. Functional ability and status Nutritional status Physical activity Advanced directives List of other physicians Hospitalizations, surgeries, and ER visits in previous 12 months Vitals Screenings to include cognitive, depression, and falls Referrals and appointments  In addition, I have reviewed and discussed with patient certain preventive protocols, quality metrics, and best practice  recommendations. A written personalized care plan for preventive services as well as general preventive health recommendations were provided to patient.     Beatris Ship, [place]   05/02/2022   Nurse Notes: None

## 2022-05-31 DIAGNOSIS — C61 Malignant neoplasm of prostate: Secondary | ICD-10-CM | POA: Diagnosis not present

## 2022-05-31 LAB — PSA: PSA: <0.015

## 2022-06-07 DIAGNOSIS — R3915 Urgency of urination: Secondary | ICD-10-CM | POA: Diagnosis not present

## 2022-06-07 DIAGNOSIS — N200 Calculus of kidney: Secondary | ICD-10-CM | POA: Diagnosis not present

## 2022-06-07 DIAGNOSIS — C61 Malignant neoplasm of prostate: Secondary | ICD-10-CM | POA: Diagnosis not present

## 2022-06-07 DIAGNOSIS — N5201 Erectile dysfunction due to arterial insufficiency: Secondary | ICD-10-CM | POA: Diagnosis not present

## 2022-06-08 ENCOUNTER — Encounter: Payer: Self-pay | Admitting: Internal Medicine

## 2022-07-06 ENCOUNTER — Telehealth: Payer: Self-pay | Admitting: Internal Medicine

## 2022-07-07 NOTE — Telephone Encounter (Signed)
Called pt. Informed him of message nurse Kara Mead sent, pt said let him reach out to PCP to see if he will refill prescription. Stated if not he will give the house a call and schedule appointment.

## 2022-07-07 NOTE — Telephone Encounter (Signed)
I filled Keppra for 3 months, after last visit I was under the impression PCP would refill, see [place] if needed. We can keep filling but will need to see [place] annually. You can schedule VV if he would like. Thanks  Meds ordered this encounter  Medications   levETIRAcetam (KEPPRA XR) 500 MG 24 hr tablet    Sig: TAKE 3 TABLETS BY MOUTH EVERY NIGHT AT BEDTIME    Dispense:  270 tablet    Refill:  0

## 2022-09-29 ENCOUNTER — Other Ambulatory Visit: Payer: Self-pay | Admitting: Internal Medicine

## 2022-09-29 ENCOUNTER — Other Ambulatory Visit: Payer: Self-pay | Admitting: Neurology

## 2022-10-03 ENCOUNTER — Ambulatory Visit (INDEPENDENT_AMBULATORY_CARE_PROVIDER_SITE_OTHER): Payer: Medicare PPO | Admitting: Internal Medicine

## 2022-10-03 ENCOUNTER — Encounter: Payer: Self-pay | Admitting: Internal Medicine

## 2022-10-03 VITALS — BP 124/66 | HR 78 | Temp 98.3°F | Resp 18 | Ht 73.0 in | Wt 209.5 lb

## 2022-10-03 DIAGNOSIS — E785 Hyperlipidemia, unspecified: Secondary | ICD-10-CM

## 2022-10-03 DIAGNOSIS — I10 Essential (primary) hypertension: Secondary | ICD-10-CM

## 2022-10-03 DIAGNOSIS — Z0000 Encounter for general adult medical examination without abnormal findings: Secondary | ICD-10-CM

## 2022-10-03 DIAGNOSIS — Z23 Encounter for immunization: Secondary | ICD-10-CM | POA: Diagnosis not present

## 2022-10-03 NOTE — Patient Instructions (Addendum)
Vaccines I recommend: Flu shot this fall  RSV vaccine  Check the  blood pressure regularly BP GOAL is between 110/65 and  135/85. If it is consistently higher or lower, let me know      GO TO THE LAB : Get the blood work     GO TO THE FRONT DESK, PLEASE SCHEDULE YOUR APPOINTMENTS Come back for   a physical 1 year     "Health Care Power of attorney" ,  "Living will" (Advance care planning documents)  If you already have a living will or healthcare power of attorney, is recommended you bring the copy to be scanned in your chart.   The document will be available to all the doctors you see in the system.  Advance care planning is a process that supports adults in  understanding and sharing their preferences regarding future medical care.  The patient's preferences are recorded in documents called Advance Directives and the can be modified at any time while the patient is in full mental capacity.   If you don't have one, please consider create one.      More information at: [email or web address]

## 2022-10-03 NOTE — Progress Notes (Unsigned)
Subjective:    Patient ID: Joel [name], male    DOB: 1946-02-28, 77 y.o.   MRN: [id number]  DOS:  10/03/2022 Type of visit - description: cpx  Doing well, no concerns.  Specifically denies chest pain, palpitations.  No lower extremity edema. Review of Systems See above   Past Medical History:  Diagnosis Date   Anemia SEVERAL YRS AGO   Dr. Dulce Sellar   Back pain    HNP dx ~2008, no surgery RESOLVED NOW   Fall 05-2011    had a IC bleed    Hearing loss    Rt ear sensorineural hearing loss, . W/u by Dr. Molli Barrows extensively negative   HTN (hypertension)    Prostate cancer (HCC) 2019   Seizure disorder [facility])    Dx remotely, last SZ activity aprox 1998   Subdural hematoma (HCC) 2013   Thyroid cancer (HCC) 2020    Past Surgical History:  Procedure Laterality Date   CATARACT EXTRACTION Bilateral    R 2014, L 03-2013   CRANIOTOMY  05/27/2011   Procedure: CRANIOTOMY HEMATOMA EVACUATION SUBDURAL;  Surgeon: Hewitt Shorts, MD;  Location: [facility];  Service: Neurosurgery;  Laterality: Right;  Evacuation of Subdural Hematoma   CYSTOSCOPY N/A 02/01/2018   Procedure: CYSTOSCOPY;  Surgeon: Sebastian Ache, MD;  [facility];  Service: Urology;  Laterality: N/A;  no seeds in bladder per Dr Berneice Heinrich   EYE SURGERY  [phone]    for retinal detachment , L, Dr Luciana Axe   HERNIA REPAIR     as an infant   NECK DISSECTION  12/14/2018   PROSTATE BIOPSY  2019   RADIOACTIVE SEED IMPLANT N/A 02/01/2018   Procedure: RADIOACTIVE SEED IMPLANT/BRACHYTHERAPY IMPLANT;  Surgeon: Sebastian Ache, MD;  Location: [facility];  Service: Urology;  Laterality: N/A;  58 seeds implanted   SPACE OAR INSTILLATION N/A 02/01/2018   Procedure: SPACE OAR INSTILLATION;  Surgeon: Sebastian Ache, MD;  Location: [facility];  Service: Urology;  Laterality: N/A;   STERNOTOMY  12/14/2018   THYROIDECTOMY  12/14/2018   WISDOM TOOTH EXTRACTION     x2, Upper     Current Outpatient Medications  Medication Instructions   amLODipine-olmesartan (AZOR) 10-40 MG tablet 1 tablet, Oral, Daily   atorvastatin (LIPITOR) 20 mg, Oral, Daily at bedtime   levETIRAcetam (KEPPRA XR) 500 MG 24 hr tablet TAKE 3 TABLET BY MOUTH EVERY NIGHT AT BEDTIME   levothyroxine (SYNTHROID) 125 MCG tablet 1 tablet, Oral, Daily   metoprolol tartrate (LOPRESSOR) 12.5 mg, Oral, 2 times daily   Multiple Vitamins-Iron (MULTIVITAMIN/IRON PO) Oral       Objective:   Physical Exam BP 124/66   Pulse 78   Temp 98.3 F (36.8 C) (Oral)   Resp 18   Ht 6\' 1"  (1.854 m)   Wt 209 lb 8 oz (95 kg)   SpO2 96%   BMI 27.64 kg/m  General: Well developed, NAD, BMI noted Neck: No  thyromegaly  HEENT:  Normocephalic . Face symmetric, atraumatic Lungs:  CTA B Normal respiratory effort, no intercostal retractions, no accessory muscle use. Heart: RRR,  no murmur.  Abdomen:  Not distended, soft, non-tender. No rebound or rigidity.   Lower extremities: no pretibial edema bilaterally  Skin: Exposed areas without rash. Not pale. Not jaundice Neurologic:  alert & oriented X3.  Speech normal, gait appropriate for age and unassisted Strength symmetric and appropriate for age.  Psych: Cognition and judgment appear intact.  Cooperative with  normal attention span and concentration.  Behavior appropriate. No anxious or depressed appearing.     Assessment    Assessment  HTN High chol: rx atorva 09-2020 Paroxysmal A. fib: DX 12-2018, postop.  No anticoagulation or ASA as of 01/24/2019 Seizure disorder, last activity 05-2011 R hearing loss, h/o ENT eval (-) Intracranial bleed due to fall 2013 Anemia -- Dr. 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 PNMA 20: today  - zostavax :2014; s/p shingrex   -  last covid vax 12-2021, consider booster Vaccines I rec: RSV-flu shot (fall) -CCS: colonoscopy 2003,  cscope   04-2012, tubular adenoma, cscope 06-2015, cscope 12-15-2020 : 5 years; GI  Dr Dulce Sellar. -- prostate cancer: per urology team --Labs: CMP, FLP, CBC --Continue with healthy lifestyle , exercise twice a day every day  -- POA: Discussed HTN: BP is excellent, continue amlodipine, olmesartan, metoprolol. Seizure disorder: On Keppra, no recent events. History of thyroid cancer: On levothyroxine, sees endocrinology RTC 1 year. === LOV:FIEPPIRJJ on Azor, metoprolol.  Reports ambulatory BPs okay.  Check CMP and CBC. Seizure disorder.  Last visit with neurology 06/30/2021, no changes recommended.  Was rec to RTC as needed, I will assume refills of Keppra. High cholesterol: Started atorvastatin last year, very good results, recheck FLP, RF as needed. Paroxysmal A-fib, h/o: saw cardiology last year, had a Zio patch January 2023, show short SVT episodes. Prostate cancer, h/o, sees urology regularly Thyroid cancer, history of, sees Endo regularly. Cyst, back: Refer to plastic surgery for consideration of excisions.   RTC 1 year  In addition to CPX, I addressed his chronic medical problems and reviewed the chart.  Evaluation including a new problem, cyst in the back that required and a referral

## 2022-10-04 ENCOUNTER — Encounter: Payer: Self-pay | Admitting: Internal Medicine

## 2022-10-04 LAB — CBC WITH DIFFERENTIAL/PLATELET
Basophils Absolute: 0 10*3/uL (ref 0.0–0.1)
Basophils Relative: 0.5 % (ref 0.0–3.0)
Eosinophils Absolute: 0.1 10*3/uL (ref 0.0–0.7)
Eosinophils Relative: 1 % (ref 0.0–5.0)
HCT: 43.4 % (ref 39.0–52.0)
Hemoglobin: 14.1 g/dL (ref 13.0–17.0)
Lymphocytes Relative: 13.4 % (ref 12.0–46.0)
Lymphs Abs: 1.1 10*3/uL (ref 0.7–4.0)
MCHC: 32.6 g/dL (ref 30.0–36.0)
MCV: 98.2 fl (ref 78.0–100.0)
Monocytes Absolute: 0.7 10*3/uL (ref 0.1–1.0)
Monocytes Relative: 8 % (ref 3.0–12.0)
Neutro Abs: 6.5 10*3/uL (ref 1.4–7.7)
Neutrophils Relative %: 77.1 % — ABNORMAL HIGH (ref 43.0–77.0)
Platelets: 168 10*3/uL (ref 150.0–400.0)
RBC: 4.42 Mil/uL (ref 4.22–5.81)
RDW: 12.7 % (ref 11.5–15.5)
WBC: 8.4 10*3/uL (ref 4.0–10.5)

## 2022-10-04 LAB — COMPREHENSIVE METABOLIC PANEL
ALT: 15 U/L (ref 0–53)
AST: 25 U/L (ref 0–37)
Albumin: 4.4 g/dL (ref 3.5–5.2)
Alkaline Phosphatase: 69 U/L (ref 39–117)
BUN: 12 mg/dL (ref 6–23)
CO2: 28 mEq/L (ref 19–32)
Calcium: 9.7 mg/dL (ref 8.4–10.5)
Chloride: 103 mEq/L (ref 96–112)
Creatinine, Ser: 1.04 mg/dL (ref 0.40–1.50)
GFR: 70.22 mL/min (ref >60.00)
Glucose, Bld: 90 mg/dL (ref 70–99)
Potassium: 4.4 mEq/L (ref 3.5–5.1)
Sodium: 139 mEq/L (ref 135–145)
Total Bilirubin: 1.4 mg/dL — ABNORMAL HIGH (ref 0.2–1.2)
Total Protein: 7.1 g/dL (ref 6.0–8.3)

## 2022-10-04 LAB — LIPID PANEL
Cholesterol: 111 mg/dL (ref 0–200)
HDL: 44.5 mg/dL (ref >39.00)
LDL Cholesterol: 54 mg/dL (ref 0–99)
NonHDL: 66.67
Total CHOL/HDL Ratio: 2
Triglycerides: 61 mg/dL (ref 0.0–149.0)
VLDL: 12.2 mg/dL (ref 0.0–40.0)

## 2022-10-04 NOTE — Assessment & Plan Note (Signed)
-   Tdap 2019 - pnm 13: 2016, 2017; PNM 23: 2014. PNMA 20: today  - zostavax :2014; s/p shingrex   -  last covid vax 12-2021, consider booster Vaccines I rec: RSV-flu shot (fall) -CCS: colonoscopy 2003,  cscope   04-2012, tubular adenoma, cscope 06-2015, cscope 12-15-2020 : 5 years; GI  Dr Dulce Sellar. -- prostate cancer: per urology team --Labs: CMP, FLP, CBC --Continue with healthy lifestyle , exercise twice a day every day  -- POA: Discussed

## 2022-10-04 NOTE — Assessment & Plan Note (Addendum)
Here for CPX  HTN: BP is excellent, continue amlodipine, olmesartan, metoprolol. Seizure disorder: On Keppra, no recent events. History of thyroid cancer: On levothyroxine, sees endocrinology RTC 1 year.

## 2022-11-02 ENCOUNTER — Other Ambulatory Visit: Payer: Self-pay | Admitting: Internal Medicine

## 2022-11-02 ENCOUNTER — Telehealth: Payer: Self-pay | Admitting: Internal Medicine

## 2022-11-02 NOTE — Telephone Encounter (Signed)
Pt called to follow up on his Atorvastatin refill. Pharmacy gave him a few pills to get him through until they hear back from provider. Please send to [facility] on [address]

## 2022-11-02 NOTE — Telephone Encounter (Signed)
atorvastatin (LIPITOR) 20 MG tablet 90 tablet 1 11/02/2022 --   Sig - Route: Take 1 tablet (20 mg total) by mouth at bedtime. - Oral   Sent to pharmacy as: atorvastatin (LIPITOR) 20 MG tablet   E-Prescribing Status: Receipt confirmed by pharmacy (11/02/2022  3:20 PM EDT)     Rx was sent. See above.

## 2022-11-15 ENCOUNTER — Encounter (INDEPENDENT_AMBULATORY_CARE_PROVIDER_SITE_OTHER): Payer: Medicare PPO | Admitting: Ophthalmology

## 2022-11-15 ENCOUNTER — Encounter (INDEPENDENT_AMBULATORY_CARE_PROVIDER_SITE_OTHER): Payer: Self-pay

## 2022-11-15 DIAGNOSIS — H35352 Cystoid macular degeneration, left eye: Secondary | ICD-10-CM | POA: Diagnosis not present

## 2022-11-15 DIAGNOSIS — H43811 Vitreous degeneration, right eye: Secondary | ICD-10-CM | POA: Diagnosis not present

## 2022-11-15 DIAGNOSIS — Z9889 Other specified postprocedural states: Secondary | ICD-10-CM | POA: Diagnosis not present

## 2022-11-27 ENCOUNTER — Other Ambulatory Visit: Payer: Self-pay | Admitting: Neurology

## 2022-12-23 ENCOUNTER — Other Ambulatory Visit: Payer: Self-pay | Admitting: Internal Medicine

## 2023-02-10 ENCOUNTER — Other Ambulatory Visit: Payer: Self-pay | Admitting: Neurology

## 2023-02-13 ENCOUNTER — Telehealth: Payer: Self-pay | Admitting: Neurology

## 2023-02-13 DIAGNOSIS — H35352 Cystoid macular degeneration, left eye: Secondary | ICD-10-CM | POA: Diagnosis not present

## 2023-02-13 DIAGNOSIS — H43811 Vitreous degeneration, right eye: Secondary | ICD-10-CM | POA: Diagnosis not present

## 2023-02-13 DIAGNOSIS — Z9889 Other specified postprocedural states: Secondary | ICD-10-CM | POA: Diagnosis not present

## 2023-02-13 NOTE — Telephone Encounter (Signed)
Pt came into office, just got his refill of evETIRAcetam (KEPPRA XR) 500 MG 24 hr tablet . States he only has one more left and was thinking that his primary care needed to be the ones to refill. Pt doesn't know if [facility] needs to send over something to his PCP to be able to fill this for him and wants to be able to continue on mediatation.

## 2023-02-14 DIAGNOSIS — I480 Paroxysmal atrial fibrillation: Secondary | ICD-10-CM | POA: Diagnosis not present

## 2023-02-14 DIAGNOSIS — I499 Cardiac arrhythmia, unspecified: Secondary | ICD-10-CM | POA: Diagnosis not present

## 2023-02-16 ENCOUNTER — Telehealth: Payer: Self-pay | Admitting: Internal Medicine

## 2023-02-16 MED ORDER — LEVETIRACETAM ER 500 MG PO TB24: 1500.0000 mg | ORAL_TABLET | Freq: Every day | ORAL | 1 refills | Status: DC

## 2023-02-16 NOTE — Addendum Note (Signed)
Addended byConrad [place] D on: 02/16/2023 10:49 AM   Modules accepted: Orders

## 2023-02-16 NOTE — Telephone Encounter (Signed)
Last note from neurology reviewed, they did recommend follow-up with them as needed and PCP to refill Keppra. Send 46-month supply.

## 2023-02-16 NOTE — Telephone Encounter (Signed)
Patient called and stated that he was seeing his neurologist at [facility] on a regular basis and was being prescribed levETIRAcetam (KEPPRA XR) 500 MG. He explained that Margie Ege, NP, is requesting for Dr. Drue Novel to contact them to authorize continuing his medication since the patient will no longer be seeing her. Please call and advise.

## 2023-02-16 NOTE — Telephone Encounter (Signed)
Rx sent 

## 2023-02-18 DIAGNOSIS — I499 Cardiac arrhythmia, unspecified: Secondary | ICD-10-CM | POA: Diagnosis not present

## 2023-02-18 DIAGNOSIS — I440 Atrioventricular block, first degree: Secondary | ICD-10-CM | POA: Diagnosis not present

## 2023-03-18 ENCOUNTER — Other Ambulatory Visit: Payer: Self-pay | Admitting: Internal Medicine

## 2023-03-20 DIAGNOSIS — E890 Postprocedural hypothyroidism: Secondary | ICD-10-CM | POA: Diagnosis not present

## 2023-03-20 DIAGNOSIS — C73 Malignant neoplasm of thyroid gland: Secondary | ICD-10-CM | POA: Diagnosis not present

## 2023-03-23 DIAGNOSIS — C73 Malignant neoplasm of thyroid gland: Secondary | ICD-10-CM | POA: Diagnosis not present

## 2023-03-23 DIAGNOSIS — E890 Postprocedural hypothyroidism: Secondary | ICD-10-CM | POA: Diagnosis not present

## 2023-04-11 DIAGNOSIS — Z961 Presence of intraocular lens: Secondary | ICD-10-CM | POA: Diagnosis not present

## 2023-04-11 DIAGNOSIS — H43813 Vitreous degeneration, bilateral: Secondary | ICD-10-CM | POA: Diagnosis not present

## 2023-04-11 DIAGNOSIS — H524 Presbyopia: Secondary | ICD-10-CM | POA: Diagnosis not present

## 2023-05-18 ENCOUNTER — Ambulatory Visit: Payer: Medicare PPO

## 2023-05-18 VITALS — Ht 73.0 in | Wt 209.0 lb

## 2023-05-18 DIAGNOSIS — Z0000 Encounter for general adult medical examination without abnormal findings: Secondary | ICD-10-CM | POA: Diagnosis not present

## 2023-05-18 NOTE — Progress Notes (Signed)
 Subjective:   Joel [name] is a 78 y.o. male who presents for Medicare Annual/Subsequent preventive examination.  Visit Complete: Virtual I connected with  Joel [name] on 05/18/23 by a audio enabled telemedicine application and verified that I am speaking with the correct person using two identifiers.  Patient Location: Home  Provider Location: Home Office  I discussed the limitations of evaluation and management by telemedicine. The patient expressed understanding and agreed to proceed.  Vital Signs: Because this visit was a virtual/telehealth visit, some criteria may be missing or patient reported. Any vitals not documented were not able to be obtained and vitals that have been documented are patient reported.  Patient Medicare AWV questionnaire was completed by the patient on 05/11/23; I have confirmed that all information answered by patient is correct and no changes since this date.  Cardiac Risk Factors include: advanced age (>24men, >31 women);hypertension;male gender     Objective:    Today's Vitals   05/18/23 1555  Weight: 209 lb (94.8 kg)  Height: 6\' 1"  (1.854 m)   Body mass index is 27.57 kg/m.     05/18/2023    4:00 PM 05/02/2022    3:12 PM 04/29/2021    3:07 PM 04/23/2020    3:27 PM 02/14/2019    3:09 PM 05/10/2018    2:34 PM 02/01/2018    8:17 AM  Advanced Directives  Does Patient Have a Medical Advance Directive? Yes No No No No No No  Type of Estate agent of [place];Living will        Copy of Healthcare Power of Attorney in Chart? No - copy requested        Would patient like information on creating a medical advance directive?  No - Patient declined No - Patient declined Yes (MAU/Ambulatory/Procedural Areas - Information given) No - Patient declined No - Patient declined No - Patient declined    Current Medications (verified) Outpatient Encounter Medications as of 05/18/2023  Medication Sig   amLODipine-olmesartan (AZOR)  10-40 MG tablet Take 1 tablet by mouth daily.   atorvastatin (LIPITOR) 20 MG tablet Take 1 tablet (20 mg total) by mouth at bedtime.   levETIRAcetam (KEPPRA XR) 500 MG 24 hr tablet Take 3 tablets (1,500 mg total) by mouth at bedtime.   levothyroxine (SYNTHROID) 125 MCG tablet Take 1 tablet by mouth daily.   metoprolol tartrate (LOPRESSOR) 25 MG tablet Take 12.5 mg by mouth 2 (two) times daily.   Multiple Vitamins-Iron (MULTIVITAMIN/IRON PO) Take by mouth.   No facility-administered encounter medications on file as of 05/18/2023.    Allergies (verified) Patient has no known allergies.   History: Past Medical History:  Diagnosis Date   Anemia SEVERAL YRS AGO   Dr. Dulce Sellar   Back pain    HNP dx ~2008, no surgery RESOLVED NOW   Fall 05-2011    had a IC bleed    Hearing loss    Rt ear sensorineural hearing loss, . W/u by Dr. Molli Barrows extensively negative   HTN (hypertension)    Prostate cancer (HCC) 2019   Seizure disorder [facility])    Dx remotely, last SZ activity aprox 1998   Subdural hematoma (HCC) 2013   Thyroid cancer (HCC) 2020   Past Surgical History:  Procedure Laterality Date   CATARACT EXTRACTION Bilateral    R 2014, L 03-2013   CRANIOTOMY  05/27/2011   Procedure: CRANIOTOMY HEMATOMA EVACUATION SUBDURAL;  Surgeon: Hewitt Shorts, MD;  Location: [facility];  Service: Neurosurgery;  Laterality: Right;  Evacuation of Subdural Hematoma   CYSTOSCOPY N/A 02/01/2018   Procedure: CYSTOSCOPY;  Surgeon: Sebastian Ache, MD;  Location: [facility];  Service: Urology;  Laterality: N/A;  no seeds in bladder per Dr Berneice Heinrich   EYE SURGERY  [phone]    for retinal detachment , L, Dr Luciana Axe   HERNIA REPAIR     as an infant   NECK DISSECTION  12/14/2018   PROSTATE BIOPSY  2019   RADIOACTIVE SEED IMPLANT N/A 02/01/2018   Procedure: RADIOACTIVE SEED IMPLANT/BRACHYTHERAPY IMPLANT;  Surgeon: Sebastian Ache, MD;  Location: [facility];  Service: Urology;  Laterality:  N/A;  58 seeds implanted   SPACE OAR INSTILLATION N/A 02/01/2018   Procedure: SPACE OAR INSTILLATION;  Surgeon: Sebastian Ache, MD;  Location: [facility];  Service: Urology;  Laterality: N/A;   STERNOTOMY  12/14/2018   THYROIDECTOMY  12/14/2018   WISDOM TOOTH EXTRACTION     x2, Upper   Family History  Problem Relation Age of Onset   Emphysema Mother        smoker   Hypertension Father        F and B    Leukemia Sister    Prostate cancer Brother 22   Diabetes Neg Hx    Colon cancer Neg Hx    Coronary artery disease Neg Hx    Social History   Socioeconomic History   Marital status: Single    Spouse name: Not on file   Number of children: 2   Years of education: PHD   Highest education level: Doctorate  Occupational History   Occupation: Retired: Prof [employer]  Tobacco Use   Smoking status: Never   Smokeless tobacco: Never  Vaping Use   Vaping status: Never Used  Substance and Sexual Activity   Alcohol use: No    Alcohol/week: 0.0 standard drinks of alcohol    Comment: not in  years   Drug use: No   Sexual activity: Not Currently  Other Topics Concern   Not on file  Social History Narrative   2 children, one in [place]     Son lives w/ pt   Patient is retired.   Education PHD    Right handed.   Caffeine Three cups of coffee daily.   Social Drivers of Corporate investment banker Strain: Low Risk  (05/18/2023)   Overall Financial Resource Strain (CARDIA)    Difficulty of Paying Living Expenses: Not hard at all  Food Insecurity: No Food Insecurity (05/18/2023)   Hunger Vital Sign    Worried About Running Out of Food in the Last Year: Never true    Ran Out of Food in the Last Year: Never true  Transportation Needs: No Transportation Needs (05/18/2023)   PRAPARE - Administrator, Civil Service (Medical): No    Lack of Transportation (Non-Medical): No  Physical Activity: Sufficiently Active (05/18/2023)   Exercise Vital Sign    Days of  Exercise per Week: 7 days    Minutes of Exercise per Session: 150+ min  Stress: No Stress Concern Present (05/18/2023)   [employer] of Occupational Health - Occupational Stress Questionnaire    Feeling of Stress : Not at all  Social Connections: Moderately Isolated (05/18/2023)   Social Connection and Isolation Panel [NHANES]    Frequency of Communication with Friends and Family: Once a week    Frequency of Social Gatherings with Friends and Family: More than  three times a week    Attends Religious Services: Never    Active Member of Clubs or Organizations: Yes    Attends Engineer, structural: More than 4 times per year    Marital Status: Divorced    Tobacco Counseling Counseling given: Not Answered   Clinical Intake:  Pre-visit preparation completed: Yes  Pain : No/denies pain     BMI - recorded: 27.57 Nutritional Status: BMI 25 -29 Overweight Nutritional Risks: None Diabetes: No  How often do you need to have someone help you when you read instructions, pamphlets, or other written materials from your doctor or pharmacy?: 1 - Never  Interpreter Needed?: No  Information entered by :: Theresa Mulligan LPN   Activities of Daily Living    05/18/2023    3:59 PM 05/11/2023   12:21 PM  In your present state of health, do you have any difficulty performing the following activities:  Hearing? 0 0  Vision? 0 0  Difficulty concentrating or making decisions? 0 0  Walking or climbing stairs? 0 0  Dressing or bathing? 0 0  Doing errands, shopping? 0 0  Preparing Food and eating ? N N  Using the Toilet? N N  In the past six months, have you accidently leaked urine? N N  Do you have problems with loss of bowel control? N N  Managing your Medications? N N  Managing your Finances? N N  Housekeeping or managing your Housekeeping? N N    Patient Care Team: Wanda Plump, MD as PCP - General Elise Benne, MD as Consulting Physician (Ophthalmology) Luciana Axe Alford Highland, MD  as Consulting Physician (Ophthalmology) Levert Feinstein, MD as Consulting Physician (Neurology) Willis Modena, MD as Consulting Physician (Gastroenterology) Berneice Heinrich Delbert Phenix., MD as Consulting Physician (Urology) Osborn Coho, MD (Inactive) as Consulting Physician (Otolaryngology) Corey Skains, MD as Referring Physician (Otolaryngology) Noah Delaine, MD as Referring Physician (Cardiology) Hassell Done, MD as Referring Physician (Endocrinology)  Indicate any recent Medical Services you may have received from other than [facility] providers in the past year (date may be approximate).     Assessment:   This is a routine wellness examination for Markeem.  Hearing/Vision screen Hearing Screening - Comments:: Denies hearing difficulties   Vision Screening - Comments:: Wears rx glasses - up to date with routine eye exams with  [facility]   Goals Addressed               This Visit's Progress     Increase physical activity (pt-stated)        Remain Active.       Depression Screen    05/18/2023    4:09 PM 10/03/2022   12:59 PM 09/29/2021   12:50 PM 04/29/2021    3:09 PM 09/28/2020    1:05 PM 04/23/2020    3:30 PM 02/14/2019    3:19 PM  PHQ 2/9 Scores  PHQ - 2 Score 0 0 0 0 0 0 0    Fall Risk    05/18/2023    4:00 PM 05/11/2023   12:21 PM 10/03/2022    1:00 PM 04/27/2022   11:24 AM 09/29/2021   12:50 PM  Fall Risk   Falls in the past year? 0 0 0 0 0  Number falls in past yr: 0  0 0 0  Injury with Fall? 0  0 0 0  Risk for fall due to : No Fall Risks   No Fall Risks   Follow up Falls  prevention discussed;Falls evaluation completed  Falls evaluation completed Falls evaluation completed Falls evaluation completed    MEDICARE RISK AT HOME: Medicare Risk at Home Any stairs in or around the home?: No If so, are there any without handrails?: No Home free of loose throw rugs in walkways, pet beds, electrical cords, etc?: No Adequate lighting in your home to  reduce risk of falls?: Yes Life alert?: No Use of a cane, walker or w/c?: No Grab bars in the bathroom?: Yes Shower chair or bench in shower?: No Elevated toilet seat or a handicapped toilet?: Yes  TIMED UP AND GO:  Was the test performed?  No    Cognitive Function:    07/11/2016    2:36 PM  MMSE - Mini Mental State Exam  Orientation to time 5  Orientation to Place 5  Registration 3  Attention/ Calculation 5  Recall 3  Language- name 2 objects 2  Language- repeat 1  Language- follow 3 step command 3  Language- read & follow direction 1  Write a sentence 1  Copy design 1  Total score 30        05/18/2023    4:01 PM 05/02/2022    3:22 PM  6CIT Screen  What Year? 0 points 0 points  What month? 0 points 0 points  What time? 0 points 0 points  Count back from 20 0 points 0 points  Months in reverse 0 points 0 points  Repeat phrase 0 points 0 points  Total Score 0 points 0 points    Immunizations Immunization History  Administered Date(s) Administered   Fluad Quad(high Dose 65+) 11/22/2018, 03/03/2020   Influenza Whole 02/12/2007, 01/09/2008, 12/24/2008, 12/17/2009, 01/20/2013   Influenza, High Dose Seasonal PF 01/12/2018, 03/03/2020   Influenza-Unspecified 10/31/2015, 01/10/2017, 12/12/2020, 12/17/2021, 12/23/2022   PFIZER(Purple Top)SARS-COV-2 Vaccination 05/05/2019, 05/29/2019, 01/11/2020, 06/17/2020   PNEUMOCOCCAL CONJUGATE-20 10/03/2022   Pfizer Covid-19 Vaccine Bivalent Booster 36yrs & up 12/17/2021   Pneumococcal Conjugate-13 05/30/2014, 10/31/2015   Pneumococcal Polysaccharide-23 04/03/2012   Td 05/16/2007   Tdap 07/23/2017   Zoster Recombinant(Shingrix) 09/29/2020, 12/01/2020   Zoster, Live 05/04/2012    TDAP status: Up to date  Flu Vaccine status: Up to date  Pneumococcal vaccine status: Up to date  Covid-19 vaccine status: Declined, Education has been provided regarding the importance of this vaccine but patient still declined. Advised may receive  this vaccine at local pharmacy or Health Dept.or vaccine clinic. Aware to provide a copy of the vaccination record if obtained from local pharmacy or Health Dept. Verbalized acceptance and understanding.  Qualifies for Shingles Vaccine? Yes   Zostavax completed Yes   Shingrix Completed?: Yes  Screening Tests Health Maintenance  Topic Date Due   COVID-19 Vaccine (6 - 2024-25 season) 11/13/2022   Medicare Annual Wellness (AWV)  05/17/2024   Colonoscopy  12/28/2025   DTaP/Tdap/Td (3 - Td or Tdap) 07/24/2027   Pneumonia Vaccine 34+ Years old  Completed   INFLUENZA VACCINE  Completed   Hepatitis C Screening  Completed   Zoster Vaccines- Shingrix  Completed   HPV VACCINES  Aged Out    Health Maintenance  Health Maintenance Due  Topic Date Due   COVID-19 Vaccine (6 - 2024-25 season) 11/13/2022    Colorectal cancer screening: Type of screening: Colonoscopy. Completed 12/28/20. Repeat every 5 years    Additional Screening:  Hepatitis C Screening: does qualify; Completed 07/11/16  Vision Screening: Recommended annual ophthalmology exams for early detection of glaucoma and other disorders of the eye. Is  the patient up to date with their annual eye exam?  Yes  Who is the provider or what is the name of the office in which the patient attends annual eye exams? [facility] If pt is not established with a provider, would they like to be referred to a provider to establish care? No .   Dental Screening: Recommended annual dental exams for proper oral hygiene    Community Resource Referral / Chronic Care Management:  CRR required this visit?  No   CCM required this visit?  No     Plan:     I have personally reviewed and noted the following in the patient's chart:   Medical and social history Use of alcohol, tobacco or illicit drugs  Current medications and supplements including opioid prescriptions. Patient is not currently taking opioid prescriptions. Functional ability  and status Nutritional status Physical activity Advanced directives List of other physicians Hospitalizations, surgeries, and ER visits in previous 12 months Vitals Screenings to include cognitive, depression, and falls Referrals and appointments  In addition, I have reviewed and discussed with patient certain preventive protocols, quality metrics, and best practice recommendations. A written personalized care plan for preventive services as well as general preventive health recommendations were provided to patient.     Tillie Rung, LPN   10/13/9560   After Visit Summary: (MyChart) Due to this being a telephonic visit, the after visit summary with patients personalized plan was offered to patient via MyChart   Nurse Notes: None

## 2023-05-18 NOTE — Patient Instructions (Addendum)
 Mr. Veltre , Thank you for taking time to come for your Medicare Wellness Visit. I appreciate your ongoing commitment to your health goals. Please review the following plan we discussed and let me know if I can assist you in the future.   Referrals/Orders/Follow-Ups/Clinician Recommendations:   This is a list of the screening recommended for you and due dates:  Health Maintenance  Topic Date Due   COVID-19 Vaccine (6 - 2024-25 season) 11/13/2022   Medicare Annual Wellness Visit  05/17/2024   Colon Cancer Screening  12/28/2025   DTaP/Tdap/Td vaccine (3 - Td or Tdap) 07/24/2027   Pneumonia Vaccine  Completed   Flu Shot  Completed   Hepatitis C Screening  Completed   Zoster (Shingles) Vaccine  Completed   HPV Vaccine  Aged Out    Advanced directives: (Copy Requested) Please bring a copy of your health care power of attorney and living will to the office to be added to your chart at your convenience.  Next Medicare Annual Wellness Visit scheduled for next year: Yes

## 2023-05-30 DIAGNOSIS — C61 Malignant neoplasm of prostate: Secondary | ICD-10-CM | POA: Diagnosis not present

## 2023-05-30 LAB — PSA: PSA: <0.015

## 2023-06-06 DIAGNOSIS — C61 Malignant neoplasm of prostate: Secondary | ICD-10-CM | POA: Diagnosis not present

## 2023-06-06 DIAGNOSIS — N200 Calculus of kidney: Secondary | ICD-10-CM | POA: Diagnosis not present

## 2023-06-06 DIAGNOSIS — N5201 Erectile dysfunction due to arterial insufficiency: Secondary | ICD-10-CM | POA: Diagnosis not present

## 2023-06-06 DIAGNOSIS — R3915 Urgency of urination: Secondary | ICD-10-CM | POA: Diagnosis not present

## 2023-06-07 ENCOUNTER — Encounter: Payer: Self-pay | Admitting: Internal Medicine

## 2023-07-12 ENCOUNTER — Other Ambulatory Visit: Payer: Self-pay | Admitting: Internal Medicine

## 2023-08-14 DIAGNOSIS — H35352 Cystoid macular degeneration, left eye: Secondary | ICD-10-CM | POA: Diagnosis not present

## 2023-08-14 DIAGNOSIS — H43811 Vitreous degeneration, right eye: Secondary | ICD-10-CM | POA: Diagnosis not present

## 2023-08-14 DIAGNOSIS — Z9889 Other specified postprocedural states: Secondary | ICD-10-CM | POA: Diagnosis not present

## 2023-08-15 ENCOUNTER — Encounter: Payer: Self-pay | Admitting: Internal Medicine

## 2023-09-14 ENCOUNTER — Other Ambulatory Visit: Payer: Self-pay | Admitting: Internal Medicine

## 2023-10-06 ENCOUNTER — Encounter: Payer: Medicare PPO | Admitting: Internal Medicine

## 2023-10-18 ENCOUNTER — Encounter: Payer: Self-pay | Admitting: Internal Medicine

## 2023-10-18 ENCOUNTER — Ambulatory Visit: Admitting: Internal Medicine

## 2023-10-18 VITALS — BP 138/68 | HR 56 | Temp 98.3°F | Resp 16 | Ht 73.0 in | Wt 213.4 lb

## 2023-10-18 DIAGNOSIS — C61 Malignant neoplasm of prostate: Secondary | ICD-10-CM | POA: Diagnosis not present

## 2023-10-18 DIAGNOSIS — G40909 Epilepsy, unspecified, not intractable, without status epilepticus: Secondary | ICD-10-CM | POA: Diagnosis not present

## 2023-10-18 DIAGNOSIS — E785 Hyperlipidemia, unspecified: Secondary | ICD-10-CM

## 2023-10-18 DIAGNOSIS — I10 Essential (primary) hypertension: Secondary | ICD-10-CM | POA: Diagnosis not present

## 2023-10-18 DIAGNOSIS — Z0000 Encounter for general adult medical examination without abnormal findings: Secondary | ICD-10-CM | POA: Diagnosis not present

## 2023-10-18 DIAGNOSIS — R739 Hyperglycemia, unspecified: Secondary | ICD-10-CM | POA: Diagnosis not present

## 2023-10-18 NOTE — Patient Instructions (Signed)
 Vaccines I recommend: Flu shot every fall A COVID booster if not done in the last few months.  Continue checking your blood pressure regularly Blood pressure goal:  between 110/65 and  135/85. If it is consistently higher or lower, let me know     GO TO THE LAB :  Get the blood work   Your results will be posted on MyChart with my comments  Next office visit for a physical exam in 1 year Please make an appointment before you leave today

## 2023-10-18 NOTE — Progress Notes (Unsigned)
 Subjective:    Patient ID: Joel [name], male    DOB: Jul 14, 1945, 78 y.o.   MRN: [id number]  DOS:  10/18/2023 Type of visit - description: Here for CPX  Here for CPX. Doing well. Has no concerns.  Review of Systems   A 14 point review of systems is negative    Past Medical History:  Diagnosis Date   Anemia SEVERAL YRS AGO   Dr. Burnette   Back pain    HNP dx ~2008, no surgery RESOLVED NOW   Fall 05-2011    had a IC bleed    Hearing loss    Rt ear sensorineural hearing loss, . W/u by Dr. Marilyn extensively negative   HTN (hypertension)    Prostate cancer (HCC) 2019   Seizure disorder [facility])    Dx remotely, last SZ activity aprox 1998   Subdural hematoma (HCC) 2013   Thyroid  cancer (HCC) 2020    Past Surgical History:  Procedure Laterality Date   CATARACT EXTRACTION Bilateral    R 2014, L 03-2013   CRANIOTOMY  05/27/2011   Procedure: CRANIOTOMY HEMATOMA EVACUATION SUBDURAL;  Surgeon: Lamar LELON Peaches, MD;  Location: [facility];  Service: Neurosurgery;  Laterality: Right;  Evacuation of Subdural Hematoma   CYSTOSCOPY N/A 02/01/2018   Procedure: CYSTOSCOPY;  Surgeon: Alvaro Hummer, MD;  Location: [facility];  Service: Urology;  Laterality: N/A;  no seeds in bladder per Dr Alvaro   EYE SURGERY  [phone]    for retinal detachment , L, Dr Elner   HERNIA REPAIR     as an infant   NECK DISSECTION  12/14/2018   PROSTATE BIOPSY  2019   RADIOACTIVE SEED IMPLANT N/A 02/01/2018   Procedure: RADIOACTIVE SEED IMPLANT/BRACHYTHERAPY IMPLANT;  Surgeon: Alvaro Hummer, MD;  Location: [facility]  Service: Urology;  Laterality: N/A;  58 seeds implanted   SPACE OAR INSTILLATION N/A 02/01/2018   Procedure: SPACE OAR INSTILLATION;  Surgeon: Alvaro Hummer, MD;  Location[facility];  Service: Urology;  Laterality: N/A;   STERNOTOMY  12/14/2018   THYROIDECTOMY  12/14/2018   WISDOM TOOTH EXTRACTION     x2, Upper   Social History    Social History Narrative   2 children, one in [place]  [place]     Son lives w/ pt   Patient is retired.   Education PHD    Right handed.   Caffeine Three cups of coffee daily.    Current Outpatient Medications  Medication Instructions   amLODipine -olmesartan  (AZOR ) 10-40 MG tablet 1 tablet, Oral, Daily   atorvastatin  (LIPITOR) 20 mg, Oral, Daily at bedtime   levETIRAcetam  (KEPPRA  XR) 1,500 mg, Oral, Daily at bedtime   levothyroxine  (SYNTHROID ) 125 MCG tablet 1 tablet, Daily   metoprolol  tartrate (LOPRESSOR ) 12.5 mg, 2 times daily   Multiple Vitamins-Iron (MULTIVITAMIN/IRON PO) Take by mouth.       Objective:   Physical Exam BP 138/68   Pulse (!) 56   Temp 98.3 F (36.8 C) (Oral)   Resp 16   Ht 6' 1 (1.854 m)   Wt 213 lb 6 oz (96.8 kg)   SpO2 97%   BMI 28.15 kg/m  General: Well developed, NAD, BMI noted Neck: No  thyromegaly  HEENT:  Normocephalic . Face symmetric, atraumatic Lungs:  CTA B Normal respiratory effort, no intercostal retractions, no accessory muscle use. Heart: RRR,  no murmur.  Abdomen:  Not distended, soft, non-tender. No rebound or rigidity.   Lower extremities:  no pretibial edema bilaterally  Skin: Exposed areas without rash. Not pale. Not jaundice Neurologic:  alert & oriented X3.  Speech normal, gait appropriate for age and unassisted Strength symmetric and appropriate for age.  Psych: Cognition and judgment appear intact.  Cooperative with normal attention span and concentration.  Behavior appropriate. No anxious or depressed appearing.     Assessment     Assessment  HTN High chol: rx atorva 09-2020 Paroxysmal A. fib: DX 12-2018, postop.  No anticoagulation or ASA as of 01/24/2019 Seizure disorder, last activity 05-2011.  Rx by PCP, see note from now 02/2019 for Oncology: -Prostate cancer: XRT 2019, rx  lupron  -Papillary  thyroid  cancer, s/p total thyroidectomy, sternotomy, mediastinal lymph dissection on 12/14/2018 H/o retina  detachment  H/o back pain, NPH, no surgery 2008 H/o   moles in the back, saw  Dermatology 2015, told ok, was not rec to schedule a RTC R hearing loss, h/o ENT eval (-) Intracranial bleed due to fall 2013 Anemia -- Dr. Burnette GORY Here for CPX - Tdap 2019 - pnm 13: 2016, 2017; PNM 23: 2014. PNMA 20: 2024 - zostavax :2014; s/p shingrex ; s/p RSV  - Vaccines: Recommend flu shot every fall and COVID booster if not done recently. -CCS: colonoscopy 2003,  cscope   04-2012, tubular adenoma, cscope 06-2015, cscope 12-15-2020 : 5 years; GI  Dr Burnette. -- prostate cancer: per urology team --Labs: CMP FLP CBC.  Also request A1c, blood sugar has been elevated a couple of times.  Will do -- Lifestyle very healthy, exercises at least once daily.  Other issues: HTN: On amlodipine , olmesartan , metoprolol .  Reports good ambulatory BPs, no change, labs. High cholesterol: On atorvastatin .  Labs Seizure disorder: See phone note from December 2024, needed to see neurology yearly for Keppra  refills.  He has been stable for many years, PCP will refill Keppra  when needed History of thyroid  cancer: Per Endo RTC 1 year

## 2023-10-19 ENCOUNTER — Encounter: Payer: Self-pay | Admitting: Internal Medicine

## 2023-10-19 LAB — CBC WITH DIFFERENTIAL/PLATELET
Basophils Absolute: 0.1 K/uL (ref 0.0–0.1)
Basophils Relative: 0.6 % (ref 0.0–3.0)
Eosinophils Absolute: 0.1 K/uL (ref 0.0–0.7)
Eosinophils Relative: 1.2 % (ref 0.0–5.0)
HCT: 43.4 % (ref 39.0–52.0)
Hemoglobin: 14.2 g/dL (ref 13.0–17.0)
Lymphocytes Relative: 14.6 % (ref 12.0–46.0)
Lymphs Abs: 1.1 K/uL (ref 0.7–4.0)
MCHC: 32.6 g/dL (ref 30.0–36.0)
MCV: 97.1 fl (ref 78.0–100.0)
Monocytes Absolute: 0.7 K/uL (ref 0.1–1.0)
Monocytes Relative: 9 % (ref 3.0–12.0)
Neutro Abs: 5.8 K/uL (ref 1.4–7.7)
Neutrophils Relative %: 74.6 % (ref 43.0–77.0)
Platelets: 165 K/uL (ref 150.0–400.0)
RBC: 4.47 Mil/uL (ref 4.22–5.81)
RDW: 12.8 % (ref 11.5–15.5)
WBC: 7.8 K/uL (ref 4.0–10.5)

## 2023-10-19 LAB — COMPREHENSIVE METABOLIC PANEL WITH GFR
ALT: 13 U/L (ref 0–53)
AST: 21 U/L (ref 0–37)
Albumin: 4.5 g/dL (ref 3.5–5.2)
Alkaline Phosphatase: 79 U/L (ref 39–117)
BUN: 15 mg/dL (ref 6–23)
CO2: 23 meq/L (ref 19–32)
Calcium: 9.6 mg/dL (ref 8.4–10.5)
Chloride: 104 meq/L (ref 96–112)
Creatinine, Ser: 1 mg/dL (ref 0.40–1.50)
GFR: 73.06 mL/min (ref >60.00)
Glucose, Bld: 91 mg/dL (ref 70–99)
Potassium: 4.1 meq/L (ref 3.5–5.1)
Sodium: 141 meq/L (ref 135–145)
Total Bilirubin: 1.2 mg/dL (ref 0.2–1.2)
Total Protein: 7.1 g/dL (ref 6.0–8.3)

## 2023-10-19 LAB — LIPID PANEL
Cholesterol: 117 mg/dL (ref 0–200)
HDL: 43.6 mg/dL (ref >39.00)
LDL Cholesterol: 55 mg/dL (ref 0–99)
NonHDL: 73.5
Total CHOL/HDL Ratio: 3
Triglycerides: 94 mg/dL (ref 0.0–149.0)
VLDL: 18.8 mg/dL (ref 0.0–40.0)

## 2023-10-19 LAB — HEMOGLOBIN A1C: Hgb A1c MFr Bld: 6.1 % (ref 4.6–6.5)

## 2023-10-19 NOTE — Assessment & Plan Note (Signed)
 Here for CPX   Other issues: HTN: On amlodipine , olmesartan , metoprolol .  Reports good ambulatory BPs, no change, labs. High cholesterol: On atorvastatin .  Labs Seizure disorder: See phone note from December 2024, needed to see neurology yearly for Keppra  refills.  He has been stable for many years, PCP will refill Keppra  when needed History of thyroid  cancer: Per Endo RTC 1 year

## 2023-10-19 NOTE — Assessment & Plan Note (Signed)
 Here for CPX - Tdap 2019 - pnm 13: 2016, 2017; PNM 23: 2014. PNMA 20: 2024 - zostavax :2014; s/p shingrex ; s/p RSV  - Vaccines: Recommend flu shot every fall and COVID booster if not done recently. -CCS: colonoscopy 2003,  cscope   04-2012, tubular adenoma, cscope 06-2015, cscope 12-15-2020 : 5 years; GI  Dr Burnette. -- prostate cancer: per urology team --Labs: CMP FLP CBC.  Also request A1c, blood sugar has been elevated a couple of times.  Will do -- Lifestyle very healthy, exercises at least once daily.

## 2023-10-22 ENCOUNTER — Ambulatory Visit: Payer: Self-pay | Admitting: Internal Medicine

## 2023-12-15 ENCOUNTER — Telehealth: Payer: Self-pay

## 2023-12-15 NOTE — Telephone Encounter (Signed)
 Copied from CRM #[id number]. Topic: General - Other >> Dec 15, 2023  3:52 PM Joel [name] GRADE wrote: Reason for CRM: Patient is calling to advise he received the Flu & Covid vaccine today at his local pharmacy. He also wanted to know if he had any vaccines during his physical on 10/18/2023?

## 2023-12-15 NOTE — Telephone Encounter (Signed)
 Spoke w/ Pt- informed I'd updated his immunization record, chart reviewed, no vaccines given at visit on 10/18/2023. He questioned if he was up to date on his pneumonia vaccines, confirmed he had a Prevnar 20 in July 2024.

## 2024-02-12 DIAGNOSIS — H35352 Cystoid macular degeneration, left eye: Secondary | ICD-10-CM | POA: Diagnosis not present

## 2024-02-12 DIAGNOSIS — H31002 Unspecified chorioretinal scars, left eye: Secondary | ICD-10-CM | POA: Diagnosis not present

## 2024-02-12 DIAGNOSIS — Z9889 Other specified postprocedural states: Secondary | ICD-10-CM | POA: Diagnosis not present

## 2024-02-12 DIAGNOSIS — H43811 Vitreous degeneration, right eye: Secondary | ICD-10-CM | POA: Diagnosis not present

## 2024-02-12 LAB — OPHTHALMOLOGY REPORT-SCANNED

## 2024-03-09 ENCOUNTER — Other Ambulatory Visit: Payer: Self-pay | Admitting: Internal Medicine

## 2024-03-12 ENCOUNTER — Other Ambulatory Visit: Payer: Self-pay | Admitting: Internal Medicine

## 2024-05-23 ENCOUNTER — Ambulatory Visit

## 2024-10-21 ENCOUNTER — Encounter: Admitting: Internal Medicine
